# Patient Record
Sex: Female | Born: 1975
Health system: Southern US, Community
[De-identification: ages and names within clinical notes are randomized; demographics above are authoritative.]

## PROBLEM LIST (undated history)

## (undated) DIAGNOSIS — J301 Allergic rhinitis due to pollen: Secondary | ICD-10-CM

## (undated) DIAGNOSIS — N9489 Other specified conditions associated with female genital organs and menstrual cycle: Secondary | ICD-10-CM

## (undated) DIAGNOSIS — D649 Anemia, unspecified: Secondary | ICD-10-CM

## (undated) DIAGNOSIS — N6320 Unspecified lump in the left breast, unspecified quadrant: Secondary | ICD-10-CM

## (undated) DIAGNOSIS — K8020 Calculus of gallbladder without cholecystitis without obstruction: Secondary | ICD-10-CM

## (undated) DIAGNOSIS — Z87442 Personal history of urinary calculi: Secondary | ICD-10-CM

## (undated) DIAGNOSIS — K429 Umbilical hernia without obstruction or gangrene: Secondary | ICD-10-CM

## (undated) DIAGNOSIS — K219 Gastro-esophageal reflux disease without esophagitis: Secondary | ICD-10-CM

## (undated) DIAGNOSIS — N200 Calculus of kidney: Secondary | ICD-10-CM

## (undated) DIAGNOSIS — T7840XA Allergy, unspecified, initial encounter: Secondary | ICD-10-CM

## (undated) DIAGNOSIS — E039 Hypothyroidism, unspecified: Secondary | ICD-10-CM

## (undated) DIAGNOSIS — N390 Urinary tract infection, site not specified: Secondary | ICD-10-CM

## (undated) DIAGNOSIS — F419 Anxiety disorder, unspecified: Secondary | ICD-10-CM

## (undated) HISTORY — DX: Allergy, unspecified, initial encounter: T78.40XA

## (undated) HISTORY — DX: Umbilical hernia without obstruction or gangrene: K42.9

---

## 2003-03-11 ENCOUNTER — Other Ambulatory Visit: Admission: RE | Admit: 2003-03-11 | Discharge: 2003-03-11 | Payer: Self-pay | Admitting: Family Medicine

## 2003-04-26 ENCOUNTER — Encounter: Payer: Self-pay | Admitting: Family Medicine

## 2003-04-26 ENCOUNTER — Ambulatory Visit (HOSPITAL_COMMUNITY): Admission: RE | Admit: 2003-04-26 | Discharge: 2003-04-26 | Payer: Self-pay | Admitting: Family Medicine

## 2003-09-10 ENCOUNTER — Inpatient Hospital Stay: Admission: AD | Admit: 2003-09-10 | Discharge: 2003-09-10 | Payer: Self-pay | Admitting: Family Medicine

## 2003-09-21 ENCOUNTER — Inpatient Hospital Stay (HOSPITAL_COMMUNITY): Admission: AD | Admit: 2003-09-21 | Discharge: 2003-09-23 | Payer: Self-pay | Admitting: Family Medicine

## 2003-09-27 ENCOUNTER — Encounter: Admission: RE | Admit: 2003-09-27 | Discharge: 2003-10-27 | Payer: Self-pay | Admitting: Family Medicine

## 2004-04-12 ENCOUNTER — Other Ambulatory Visit: Admission: RE | Admit: 2004-04-12 | Discharge: 2004-04-12 | Payer: Self-pay | Admitting: Family Medicine

## 2005-09-13 ENCOUNTER — Other Ambulatory Visit: Admission: RE | Admit: 2005-09-13 | Discharge: 2005-09-13 | Payer: Self-pay | Admitting: Family Medicine

## 2013-01-12 ENCOUNTER — Other Ambulatory Visit: Payer: Self-pay | Admitting: Nurse Practitioner

## 2015-02-18 ENCOUNTER — Ambulatory Visit (INDEPENDENT_AMBULATORY_CARE_PROVIDER_SITE_OTHER): Payer: BLUE CROSS/BLUE SHIELD | Admitting: Nurse Practitioner

## 2015-02-18 ENCOUNTER — Encounter (INDEPENDENT_AMBULATORY_CARE_PROVIDER_SITE_OTHER): Payer: Self-pay

## 2015-02-18 VITALS — BP 108/70 | HR 73 | Temp 97.1°F | Ht 64.0 in | Wt 154.0 lb

## 2015-02-18 DIAGNOSIS — T192XXA Foreign body in vulva and vagina, initial encounter: Secondary | ICD-10-CM | POA: Diagnosis not present

## 2015-02-18 NOTE — Progress Notes (Signed)
   Subjective:    Patient ID: Erica Ross, female    DOB: [birth year], 39 y.o.   MRN: [id number]  HPI Patient started her menses this past Tuesday. She cannot remember if she put a tampon in or not- and just wants to be checked to make sure she did  Not leave one up in there. She says she cannot feel anything. Denies odor or pain.    Review of Systems  Constitutional: Negative.  Negative for chills.  HENT: Negative.   Respiratory: Negative.   Cardiovascular: Negative.   Genitourinary: Negative.   Neurological: Negative.   Psychiatric/Behavioral: Negative.   All other systems reviewed and are negative.      Objective:   Physical Exam  Constitutional: She is oriented to person, place, and time. She appears well-developed and well-nourished.  Cardiovascular: Normal rate, regular rhythm and normal heart sounds.   Pulmonary/Chest: Effort normal and breath sounds normal.  Genitourinary: Vagina normal.  No foreign body visible  Neurological: She is alert and oriented to person, place, and time.  Skin: Skin is warm and dry.  Psychiatric: She has a normal mood and affect. Her behavior is normal. Judgment and thought content normal.    BP 108/70 mmHg  Pulse 73  Temp(Src) 97.1 F (36.2 C) (Oral)  Ht 5\' 4"  (1.626 m)  Wt 154 lb (69.854 kg)  BMI 26.42 kg/m2  LMP 02/14/2015 (Exact Date)       Assessment & Plan:   1. Vaginal foreign body, initial encounter    No foreign body found Normal exam  Mary-Margaret Hassell Done, FNP

## 2015-04-13 ENCOUNTER — Encounter: Payer: Self-pay | Admitting: Physician Assistant

## 2015-04-13 ENCOUNTER — Ambulatory Visit (INDEPENDENT_AMBULATORY_CARE_PROVIDER_SITE_OTHER): Payer: BLUE CROSS/BLUE SHIELD | Admitting: Physician Assistant

## 2015-04-13 VITALS — BP 127/79 | HR 82 | Temp 97.1°F | Ht 64.0 in | Wt 149.0 lb

## 2015-04-13 DIAGNOSIS — N6320 Unspecified lump in the left breast, unspecified quadrant: Secondary | ICD-10-CM

## 2015-04-13 DIAGNOSIS — N63 Unspecified lump in breast: Secondary | ICD-10-CM | POA: Diagnosis not present

## 2015-04-13 NOTE — Progress Notes (Signed)
   Subjective:    Patient ID: Erica Ross, female    DOB: February 21, 1976, 39 y.o.   MRN: [id number]  HPI 39 y/o female presents with c/o lump under left breast. She states that she started working out 1 month ago and started wearing underwire bra. She noticed the lump last night. It is ttp and even hurts when walking down stairs.   She states that she has increased her caffeine intake over the past few weeks.     Review of Systems  Constitutional: Negative.   HENT: Negative.   Eyes: Negative.   Respiratory: Negative.   Cardiovascular: Negative.   Gastrointestinal: Negative.   Endocrine: Negative.   Genitourinary: Negative.   Musculoskeletal: Negative.   Skin:       Knot under left breast x 1 day. Painful, tender. Feels like "it needs to pop"       Objective:   Physical Exam  Skin:  2cm palpable non-fluctuant solid mass at 7:00 inferior border of left breast  Unable to express fluid from mass after using local anesthesia and 18 gauge needle.           Assessment & Plan:  1. Mass of breast, left  - MM Digital Diagnostic Bilat; Future  - Ultrasound if needed.   Will follow up with patient after results are obtained.   Shepherd Finnan [name] PA-C

## 2015-04-14 ENCOUNTER — Other Ambulatory Visit: Payer: Self-pay | Admitting: Physician Assistant

## 2015-04-14 ENCOUNTER — Telehealth: Payer: Self-pay | Admitting: *Deleted

## 2015-04-14 DIAGNOSIS — N63 Unspecified lump in unspecified breast: Secondary | ICD-10-CM

## 2015-04-14 DIAGNOSIS — F411 Generalized anxiety disorder: Secondary | ICD-10-CM

## 2015-04-14 MED ORDER — ALPRAZOLAM 0.25 MG PO TABS: 0.2500 mg | ORAL_TABLET | Freq: Three times a day (TID) | ORAL | Status: DC | PRN

## 2015-04-14 NOTE — Telephone Encounter (Signed)
Saw you yesterday and very worried about the mass in her breast. She hasn't slept for two days and can not stay calm. Wants to know if there is something she can have short term to help her sleep and function during the day until she gets through this ultrasound. Please advise and route to pool A. 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 Will call us back this afternoon because she can't get to a phone.

## 2015-04-14 NOTE — Telephone Encounter (Signed)
Rx at front desk that should help with her anxiety and sleeping   Erica Ross [name] PA-C

## 2015-04-17 NOTE — Telephone Encounter (Signed)
Spoke with patient- she states she has already picked up the prescription.

## 2015-04-17 NOTE — Telephone Encounter (Signed)
Left patient a message to return call

## 2015-04-18 ENCOUNTER — Telehealth: Payer: Self-pay | Admitting: Physician Assistant

## 2015-04-19 ENCOUNTER — Other Ambulatory Visit: Payer: Self-pay

## 2015-04-19 ENCOUNTER — Telehealth: Payer: Self-pay | Admitting: Physician Assistant

## 2015-04-19 NOTE — Telephone Encounter (Signed)
Stp and they couldn't do the imaging today due to machine malfunction but pt has imaging appt rescheduled for next week.

## 2015-04-20 NOTE — Telephone Encounter (Signed)
Appointment scheduled.

## 2015-04-26 ENCOUNTER — Ambulatory Visit
Admission: RE | Admit: 2015-04-26 | Discharge: 2015-04-26 | Disposition: A | Discharge status: Home / Self Care | Payer: BLUE CROSS/BLUE SHIELD | Source: Ambulatory Visit | Attending: Physician Assistant | Admitting: Physician Assistant

## 2015-04-26 DIAGNOSIS — N63 Unspecified lump in unspecified breast: Secondary | ICD-10-CM

## 2015-10-19 ENCOUNTER — Telehealth: Payer: Self-pay | Admitting: Nurse Practitioner

## 2016-03-08 ENCOUNTER — Other Ambulatory Visit: Payer: Self-pay | Admitting: Pediatrics

## 2016-03-08 DIAGNOSIS — Z1231 Encounter for screening mammogram for malignant neoplasm of breast: Secondary | ICD-10-CM

## 2016-03-25 ENCOUNTER — Ambulatory Visit (INDEPENDENT_AMBULATORY_CARE_PROVIDER_SITE_OTHER): Payer: BLUE CROSS/BLUE SHIELD | Admitting: Nurse Practitioner

## 2016-03-25 ENCOUNTER — Encounter: Payer: Self-pay | Admitting: Nurse Practitioner

## 2016-03-25 VITALS — BP 116/79 | HR 76 | Temp 98.4°F | Ht 64.0 in | Wt 153.0 lb

## 2016-03-25 DIAGNOSIS — Z0000 Encounter for general adult medical examination without abnormal findings: Secondary | ICD-10-CM

## 2016-03-25 DIAGNOSIS — Z01419 Encounter for gynecological examination (general) (routine) without abnormal findings: Secondary | ICD-10-CM

## 2016-03-25 LAB — MICROSCOPIC EXAMINATION
RBC, UA: NONE SEEN /hpf (ref 0–?)
WBC UA: NONE SEEN /HPF (ref 0–?)

## 2016-03-25 LAB — URINALYSIS, COMPLETE
BILIRUBIN UA: NEGATIVE
Glucose, UA: NEGATIVE
KETONES UA: NEGATIVE
Leukocytes, UA: NEGATIVE
Nitrite, UA: NEGATIVE
PH UA: 6 (ref 5.0–7.5)
PROTEIN UA: NEGATIVE
RBC UA: NEGATIVE
SPEC GRAV UA: 1.025 (ref 1.005–1.030)
UUROB: 0.2 mg/dL (ref 0.2–1.0)

## 2016-03-25 NOTE — Patient Instructions (Signed)
Pap Test WHY AM I HAVING THIS TEST? A pap test is sometimes called a pap smear. It is a screening test that is used to check for signs of cancer of the vagina, cervix, and uterus. The test can also identify the presence of infection or precancerous changes. Your health care provider will likely recommend you have this test done on a regular basis. This test may be done:  Every 3 years, starting at age 40.  Every 5 years, in combination with testing for the presence of human papillomavirus (HPV).  More or less often depending on other medical conditions.  WHAT KIND OF SAMPLE IS TAKEN? Using a small cotton swab, plastic spatula, or brush, your health care provider will collect a sample of cells from the surface of your cervix. Your cervix is the opening to your uterus, also called a womb. Secretions from the cervix and vagina may also be collected. HOW DO I PREPARE FOR THE TEST?  Be aware of where you are in your menstrual cycle. You may be asked to reschedule the test if you are menstruating on the day of the test.  You may need to reschedule if you have a known vaginal infection on the day of the test.  You may be asked to avoid douching or taking a bath the day before or the day of the test.  Some medicines can cause abnormal test results, such as digitalis and tetracycline. Talk with your health care provider before your test if you take one of these medicines. WHAT DO THE RESULTS MEAN? Abnormal test results may indicate a number of health conditions. These may include:  Cancer. Although pap test results cannot be used to diagnose cancer of the cervix, vagina, or uterus, they may suggest the possibility of cancer. Further tests would be required to determine if cancer is present.  Sexually transmitted disease.  Fungal infection.  Parasite infection.  Herpes infection.  A condition causing or contributing to infertility. It is your responsibility to obtain your test results. Ask  the lab or department performing the test when and how you will get your results. Contact your health care provider to discuss any questions you have about your results.   This information is not intended to replace advice given to you by your health care provider. Make sure you discuss any questions you have with your health care provider.   Document Released: 10/26/2002 Document Revised: 08/26/2014 Document Reviewed: 12/27/2013 Elsevier Interactive Patient Education 2016 Elsevier Inc.  

## 2016-03-25 NOTE — Progress Notes (Signed)
   Subjective:    Patient ID: Roger Kill, female    DOB: 31-Jan-1976, 40 y.o.   MRN: [id number]  HPI Patient comes in today for CPE and PAP- she is currently on no medications and has no medical problems.    Review of Systems  Constitutional: Negative.   HENT: Negative.   Respiratory: Negative.   Cardiovascular: Negative.   Gastrointestinal: Negative.   Genitourinary: Negative.   Neurological: Negative.   Psychiatric/Behavioral: Negative.   All other systems reviewed and are negative.      Objective:   Physical Exam  Constitutional: She is oriented to person, place, and time. She appears well-developed and well-nourished.  HENT:  Head: Normocephalic.  Right Ear: Hearing, tympanic membrane, external ear and ear canal normal.  Left Ear: Hearing, tympanic membrane, external ear and ear canal normal.  Nose: Nose normal.  Mouth/Throat: Uvula is midline and oropharynx is clear and moist.  Eyes: Conjunctivae and EOM are normal. Pupils are equal, round, and reactive to light.  Neck: Normal range of motion and full passive range of motion without pain. Neck supple. No JVD present. Carotid bruit is not present. No thyroid mass and no thyromegaly present.  Cardiovascular: Normal rate, normal heart sounds and intact distal pulses.   No murmur heard. Pulmonary/Chest: Effort normal and breath sounds normal. Right breast exhibits no inverted nipple, no mass, no nipple discharge, no skin change and no tenderness. Left breast exhibits no inverted nipple, no mass, no nipple discharge, no skin change and no tenderness.  Abdominal: Soft. Bowel sounds are normal. She exhibits no mass. There is no tenderness.  Genitourinary: Uterus normal. No breast swelling, tenderness, discharge or bleeding. Vaginal discharge found.  Genitourinary Comments: bimanual exam-No adnexal masses or tenderness. Cervix parous and pink  Musculoskeletal: Normal range of motion.  Lymphadenopathy:    She has no cervical  adenopathy.  Neurological: She is alert and oriented to person, place, and time.  Skin: Skin is warm and dry.  Psychiatric: She has a normal mood and affect. Her behavior is normal. Judgment and thought content normal.   BP 116/79 (BP Location: Right Arm, Patient Position: Sitting, Cuff Size: Normal)   Pulse 76   Temp 98.4 F (36.9 C) (Oral)   Ht '5\' 4"'$  (1.626 m)   Wt 153 lb (69.4 kg)   BMI 26.26 kg/m         Assessment & Plan:   1. Annual physical exam   2. Encounter for routine gynecological examination    Orders Placed This Encounter  Procedures  . Urinalysis, Complete  . CMP14+EGFR  . Lipid panel  . Thyroid Panel With TSH  . CBC with Differential/Platelet   Diet and exercise encouraged Follow up in 1 year  [place], FNP

## 2016-03-26 LAB — CBC WITH DIFFERENTIAL/PLATELET
BASOS: 1 %
Basophils Absolute: 0.1 10*3/uL (ref 0.0–0.2)
EOS (ABSOLUTE): 0.2 10*3/uL (ref 0.0–0.4)
Eos: 3 %
HEMATOCRIT: 40.2 % (ref 34.0–46.6)
Hemoglobin: 13.1 g/dL (ref 11.1–15.9)
Immature Grans (Abs): 0 10*3/uL (ref 0.0–0.1)
Immature Granulocytes: 0 %
LYMPHS ABS: 3.5 10*3/uL — AB (ref 0.7–3.1)
Lymphs: 37 %
MCH: 30 pg (ref 26.6–33.0)
MCHC: 32.6 g/dL (ref 31.5–35.7)
MCV: 92 fL (ref 79–97)
MONOS ABS: 0.7 10*3/uL (ref 0.1–0.9)
Monocytes: 7 %
NEUTROS ABS: 5 10*3/uL (ref 1.4–7.0)
NEUTROS PCT: 52 %
PLATELETS: 277 10*3/uL (ref 150–379)
RBC: 4.36 x10E6/uL (ref 3.77–5.28)
RDW: 13.8 % (ref 12.3–15.4)
WBC: 9.5 10*3/uL (ref 3.4–10.8)

## 2016-03-26 LAB — CMP14+EGFR
A/G RATIO: 1.4 (ref 1.2–2.2)
ALK PHOS: 66 IU/L (ref 39–117)
ALT: 15 IU/L (ref 0–32)
AST: 19 IU/L (ref 0–40)
Albumin: 4.3 g/dL (ref 3.5–5.5)
BUN/Creatinine Ratio: 20 (ref 9–23)
BUN: 12 mg/dL (ref 6–24)
CHLORIDE: 100 mmol/L (ref 96–106)
CO2: 25 mmol/L (ref 18–29)
Calcium: 9.3 mg/dL (ref 8.7–10.2)
Creatinine, Ser: 0.6 mg/dL (ref 0.57–1.00)
GFR calc Af Amer: 132 mL/min/{1.73_m2} (ref >59)
GFR calc non Af Amer: 114 mL/min/{1.73_m2} (ref >59)
Globulin, Total: 3 g/dL (ref 1.5–4.5)
Glucose: 90 mg/dL (ref 65–99)
POTASSIUM: 4.6 mmol/L (ref 3.5–5.2)
Sodium: 140 mmol/L (ref 134–144)
Total Protein: 7.3 g/dL (ref 6.0–8.5)

## 2016-03-26 LAB — THYROID PANEL WITH TSH
Free Thyroxine Index: 1.6 (ref 1.2–4.9)
T3 UPTAKE RATIO: 25 % (ref 24–39)
T4 TOTAL: 6.2 ug/dL (ref 4.5–12.0)
TSH: 1.77 u[IU]/mL (ref 0.450–4.500)

## 2016-03-26 LAB — LIPID PANEL
CHOL/HDL RATIO: 3.6 ratio (ref 0.0–4.4)
Cholesterol, Total: 172 mg/dL (ref 100–199)
HDL: 48 mg/dL (ref >39)
LDL Calculated: 109 mg/dL — ABNORMAL HIGH (ref 0–99)
TRIGLYCERIDES: 73 mg/dL (ref 0–149)
VLDL Cholesterol Cal: 15 mg/dL (ref 5–40)

## 2016-03-27 LAB — PAP IG W/ RFLX HPV ASCU: PAP SMEAR COMMENT: 0

## 2016-08-09 ENCOUNTER — Ambulatory Visit: Payer: BLUE CROSS/BLUE SHIELD

## 2017-02-25 ENCOUNTER — Ambulatory Visit: Payer: BLUE CROSS/BLUE SHIELD

## 2018-02-01 ENCOUNTER — Telehealth: Payer: Self-pay | Admitting: Family Medicine

## 2018-02-01 DIAGNOSIS — J029 Acute pharyngitis, unspecified: Secondary | ICD-10-CM | POA: Diagnosis not present

## 2018-02-01 DIAGNOSIS — Z6825 Body mass index (BMI) 25.0-25.9, adult: Secondary | ICD-10-CM | POA: Diagnosis not present

## 2018-02-01 NOTE — Telephone Encounter (Signed)
**  [facility] After Hours/ Emergency Line Call**  Patient: Erica Ross   PCP: Chevis Pretty, [place]  Patient calls to inform that she has a sore throat that started a couple of days ago.  She reports that now, the sore throat is worsening and has white patches on the back of her throat.  No fevers, nausea, vomiting. She reports malaise.  She has been taking tylenol, motrin and using salt water gargles.  I offered her an appt to be seen tomorrow morning but she declines.  She plans to go to UC today. She would like to reschedule her Physical w/ MM for next tues however because she is sick.  This was done is now on 6/25 @915a .  Continue current regimen until evaluated by UC. Will forward to PCP.  Romyn Boswell M. Lajuana Ripple, DO

## 2018-02-03 ENCOUNTER — Other Ambulatory Visit: Payer: BLUE CROSS/BLUE SHIELD | Admitting: Nurse Practitioner

## 2018-02-10 ENCOUNTER — Ambulatory Visit (INDEPENDENT_AMBULATORY_CARE_PROVIDER_SITE_OTHER): Payer: BLUE CROSS/BLUE SHIELD | Admitting: Nurse Practitioner

## 2018-02-10 ENCOUNTER — Encounter: Payer: Self-pay | Admitting: Nurse Practitioner

## 2018-02-10 VITALS — BP 113/77 | HR 69 | Temp 97.8°F | Ht 64.0 in | Wt 147.0 lb

## 2018-02-10 DIAGNOSIS — Z0000 Encounter for general adult medical examination without abnormal findings: Secondary | ICD-10-CM

## 2018-02-10 DIAGNOSIS — Z01419 Encounter for gynecological examination (general) (routine) without abnormal findings: Secondary | ICD-10-CM

## 2018-02-10 LAB — URINALYSIS, COMPLETE
Bilirubin, UA: NEGATIVE
Glucose, UA: NEGATIVE
Ketones, UA: NEGATIVE
LEUKOCYTES UA: NEGATIVE
Nitrite, UA: NEGATIVE
PH UA: 7.5 (ref 5.0–7.5)
PROTEIN UA: NEGATIVE
RBC, UA: NEGATIVE
Specific Gravity, UA: 1.01 (ref 1.005–1.030)
Urobilinogen, Ur: 0.2 mg/dL (ref 0.2–1.0)

## 2018-02-10 LAB — MICROSCOPIC EXAMINATION
Bacteria, UA: NONE SEEN
RBC MICROSCOPIC, UA: NONE SEEN /HPF (ref 0–2)
Renal Epithel, UA: NONE SEEN /hpf

## 2018-02-10 NOTE — Progress Notes (Signed)
   Subjective:    Patient ID: Erica Ross, female    DOB: [birth year], 42 y.o.   MRN: [id number]   Chief Complaint: Annual Exam   HPI patient comes in today for annual physical exam and pap. She has no chronic medical problems and is on no meds.  Medical history reviewed.    Review of Systems  Constitutional: Negative for activity change and appetite change.  HENT: Negative.   Eyes: Negative for pain.  Respiratory: Negative for shortness of breath.   Cardiovascular: Negative for chest pain, palpitations and leg swelling.  Gastrointestinal: Negative for abdominal pain.  Endocrine: Negative for polydipsia.  Genitourinary: Negative.   Skin: Negative for rash.  Neurological: Negative for dizziness, weakness and headaches.  Hematological: Does not bruise/bleed easily.  Psychiatric/Behavioral: Negative.   All other systems reviewed and are negative.      Objective:   Physical Exam  Constitutional: She is oriented to person, place, and time. She appears well-developed and well-nourished. No distress.  HENT:  Head: Normocephalic.  Nose: Nose normal.  Mouth/Throat: Oropharynx is clear and moist.  Eyes: Pupils are equal, round, and reactive to light. EOM are normal.  Neck: Normal range of motion. Neck supple. No JVD present. Carotid bruit is not present.  Cardiovascular: Normal rate, regular rhythm, normal heart sounds and intact distal pulses.  Pulmonary/Chest: Effort normal and breath sounds normal. No respiratory distress. She has no wheezes. She has no rales. She exhibits no tenderness.  Abdominal: Soft. Normal appearance, normal aorta and bowel sounds are normal. She exhibits no distension, no abdominal bruit, no pulsatile midline mass and no mass. There is no splenomegaly or hepatomegaly. There is no tenderness.  Genitourinary: Vagina normal. No vaginal discharge found.  Genitourinary Comments: Manual exam- no adnexal masses or tenderness Cervical os parous and pink    Musculoskeletal: Normal range of motion. She exhibits no edema.  Lymphadenopathy:    She has no cervical adenopathy.  Neurological: She is alert and oriented to person, place, and time. She has normal reflexes.  Skin: Skin is warm and dry.  Psychiatric: She has a normal mood and affect. Her behavior is normal. Judgment and thought content normal.   BP 113/77   Pulse 69   Temp 97.8 F (36.6 C) (Oral)   Ht '5\' 4"'$  (1.626 m)   Wt 147 lb (66.7 kg)   BMI 25.23 kg/m       Assessment & Plan:  Erica Ross comes in today with chief complaint of Annual Exam   Diagnosis and orders addressed:  1. Annual physical exam - Urinalysis, Complete - CMP14+EGFR - Lipid panel - Thyroid Panel With TSH - CBC with Differential/Platelet  2. Gynecologic exam normal - IGP, Aptima HPV, rfx 16/18,45   Labs pending Health Maintenance reviewed Diet and exercise encouraged  Follow up plan: 1 year   [place], FNP

## 2018-02-10 NOTE — Patient Instructions (Signed)

## 2018-02-11 LAB — THYROID PANEL WITH TSH
FREE THYROXINE INDEX: 1.8 (ref 1.2–4.9)
T3 Uptake Ratio: 26 % (ref 24–39)
T4 TOTAL: 7.1 ug/dL (ref 4.5–12.0)
TSH: 1.84 u[IU]/mL (ref 0.450–4.500)

## 2018-02-11 LAB — LIPID PANEL
Chol/HDL Ratio: 3.5 ratio (ref 0.0–4.4)
Cholesterol, Total: 174 mg/dL (ref 100–199)
HDL: 50 mg/dL (ref >39)
LDL Calculated: 109 mg/dL — ABNORMAL HIGH (ref 0–99)
Triglycerides: 73 mg/dL (ref 0–149)
VLDL Cholesterol Cal: 15 mg/dL (ref 5–40)

## 2018-02-11 LAB — CMP14+EGFR
A/G RATIO: 1.4 (ref 1.2–2.2)
ALBUMIN: 4.4 g/dL (ref 3.5–5.5)
ALT: 14 IU/L (ref 0–32)
AST: 16 IU/L (ref 0–40)
Alkaline Phosphatase: 68 IU/L (ref 39–117)
BUN / CREAT RATIO: 18 (ref 9–23)
BUN: 12 mg/dL (ref 6–24)
Bilirubin Total: 0.3 mg/dL (ref 0.0–1.2)
CHLORIDE: 104 mmol/L (ref 96–106)
CO2: 23 mmol/L (ref 20–29)
Calcium: 9.4 mg/dL (ref 8.7–10.2)
Creatinine, Ser: 0.67 mg/dL (ref 0.57–1.00)
GFR calc non Af Amer: 109 mL/min/{1.73_m2} (ref >59)
GFR, EST AFRICAN AMERICAN: 125 mL/min/{1.73_m2} (ref >59)
Globulin, Total: 3.2 g/dL (ref 1.5–4.5)
Glucose: 97 mg/dL (ref 65–99)
POTASSIUM: 4.5 mmol/L (ref 3.5–5.2)
SODIUM: 141 mmol/L (ref 134–144)
TOTAL PROTEIN: 7.6 g/dL (ref 6.0–8.5)

## 2018-02-11 LAB — CBC WITH DIFFERENTIAL/PLATELET
BASOS ABS: 0 10*3/uL (ref 0.0–0.2)
Basos: 0 %
EOS (ABSOLUTE): 0.2 10*3/uL (ref 0.0–0.4)
Eos: 2 %
HEMATOCRIT: 42.5 % (ref 34.0–46.6)
Hemoglobin: 13.8 g/dL (ref 11.1–15.9)
Immature Grans (Abs): 0 10*3/uL (ref 0.0–0.1)
Immature Granulocytes: 0 %
Lymphocytes Absolute: 3.4 10*3/uL — ABNORMAL HIGH (ref 0.7–3.1)
Lymphs: 38 %
MCH: 28.8 pg (ref 26.6–33.0)
MCHC: 32.5 g/dL (ref 31.5–35.7)
MCV: 89 fL (ref 79–97)
MONOS ABS: 0.8 10*3/uL (ref 0.1–0.9)
Monocytes: 9 %
Neutrophils Absolute: 4.6 10*3/uL (ref 1.4–7.0)
Neutrophils: 51 %
Platelets: 323 10*3/uL (ref 150–450)
RBC: 4.8 x10E6/uL (ref 3.77–5.28)
RDW: 13.9 % (ref 12.3–15.4)
WBC: 9 10*3/uL (ref 3.4–10.8)

## 2018-02-16 ENCOUNTER — Telehealth: Payer: Self-pay | Admitting: Nurse Practitioner

## 2018-02-16 LAB — IGP, APTIMA HPV, RFX 16/18,45
HPV Aptima: NEGATIVE
PAP Smear Comment: 0

## 2018-02-16 NOTE — Telephone Encounter (Signed)
Patient requesting pap results. Will you please review

## 2018-02-17 NOTE — Telephone Encounter (Signed)
Aware of results. 

## 2018-02-17 NOTE — Telephone Encounter (Signed)
Pap wsa normal- repeat in 2-3 years

## 2018-02-28 DIAGNOSIS — Z79899 Other long term (current) drug therapy: Secondary | ICD-10-CM | POA: Diagnosis not present

## 2018-02-28 DIAGNOSIS — N3000 Acute cystitis without hematuria: Secondary | ICD-10-CM | POA: Diagnosis not present

## 2018-02-28 DIAGNOSIS — R1903 Right lower quadrant abdominal swelling, mass and lump: Secondary | ICD-10-CM | POA: Diagnosis not present

## 2018-02-28 DIAGNOSIS — R109 Unspecified abdominal pain: Secondary | ICD-10-CM | POA: Diagnosis not present

## 2018-02-28 DIAGNOSIS — R1031 Right lower quadrant pain: Secondary | ICD-10-CM | POA: Diagnosis not present

## 2018-02-28 DIAGNOSIS — R1030 Lower abdominal pain, unspecified: Secondary | ICD-10-CM | POA: Diagnosis not present

## 2018-02-28 DIAGNOSIS — F4322 Adjustment disorder with anxiety: Secondary | ICD-10-CM | POA: Diagnosis not present

## 2018-02-28 DIAGNOSIS — K8020 Calculus of gallbladder without cholecystitis without obstruction: Secondary | ICD-10-CM | POA: Diagnosis not present

## 2018-02-28 DIAGNOSIS — N1330 Unspecified hydronephrosis: Secondary | ICD-10-CM | POA: Diagnosis not present

## 2018-02-28 DIAGNOSIS — K8080 Other cholelithiasis without obstruction: Secondary | ICD-10-CM | POA: Diagnosis not present

## 2018-02-28 DIAGNOSIS — N390 Urinary tract infection, site not specified: Secondary | ICD-10-CM | POA: Diagnosis not present

## 2018-03-02 ENCOUNTER — Telehealth: Payer: Self-pay | Admitting: Nurse Practitioner

## 2018-03-02 DIAGNOSIS — N9489 Other specified conditions associated with female genital organs and menstrual cycle: Secondary | ICD-10-CM

## 2018-03-02 DIAGNOSIS — R9389 Abnormal findings on diagnostic imaging of other specified body structures: Secondary | ICD-10-CM | POA: Diagnosis not present

## 2018-03-02 DIAGNOSIS — R1909 Other intra-abdominal and pelvic swelling, mass and lump: Secondary | ICD-10-CM | POA: Diagnosis not present

## 2018-03-02 DIAGNOSIS — R1903 Right lower quadrant abdominal swelling, mass and lump: Secondary | ICD-10-CM | POA: Diagnosis not present

## 2018-03-02 DIAGNOSIS — N1330 Unspecified hydronephrosis: Secondary | ICD-10-CM | POA: Diagnosis not present

## 2018-03-02 HISTORY — DX: Other specified conditions associated with female genital organs and menstrual cycle: N94.89

## 2018-03-03 ENCOUNTER — Telehealth: Payer: Self-pay | Admitting: Nurse Practitioner

## 2018-03-03 ENCOUNTER — Other Ambulatory Visit: Payer: Self-pay

## 2018-03-03 DIAGNOSIS — R1900 Intra-abdominal and pelvic swelling, mass and lump, unspecified site: Secondary | ICD-10-CM

## 2018-03-03 NOTE — Telephone Encounter (Signed)
Spoke with patient and notified of results. Patient verbalized understanding and requested that a referral go ahead and be placed for a surgeon. Referral made for urgent request.

## 2018-03-03 NOTE — Telephone Encounter (Signed)
There is a mass that is pressing on her ureter and making her kidney swell (hydronephrosis).  I recommend that a surgeon referral be placed and URGENT.

## 2018-03-03 NOTE — Telephone Encounter (Signed)
Patient called stating that she had a US done yesterday which she states showed a mass and would like for [place] to be reviewed as soon as possible.  Patient aware MMM is out of the office until next week and patient states that she would like for [place] to be reviewed by a different provider if possible.

## 2018-03-03 NOTE — Telephone Encounter (Signed)
Patient notified and referral placed

## 2018-03-04 ENCOUNTER — Telehealth: Payer: Self-pay

## 2018-03-04 ENCOUNTER — Other Ambulatory Visit: Payer: Self-pay

## 2018-03-04 DIAGNOSIS — R1900 Intra-abdominal and pelvic swelling, mass and lump, unspecified site: Secondary | ICD-10-CM

## 2018-03-04 NOTE — Telephone Encounter (Signed)
Called patient, discussed report.  [facility] recommended she be seen by urology bc of hydronephrosis.  She has an appointment tomorrow at 815.

## 2018-03-04 NOTE — Telephone Encounter (Signed)
I am sending this to you because this is [facility]MM Patient and Glenard Haring put in a referral for her for general surgeon for calcified mass in Right adnexa.  Patient is questioning should this be GYN and wants it sent to a Medical Dr to determine  I will put report on your desk as it was done at [facility]

## 2018-03-05 DIAGNOSIS — N130 Hydronephrosis with ureteropelvic junction obstruction: Secondary | ICD-10-CM | POA: Diagnosis not present

## 2018-03-05 DIAGNOSIS — N200 Calculus of kidney: Secondary | ICD-10-CM | POA: Diagnosis not present

## 2018-03-07 ENCOUNTER — Telehealth: Payer: Self-pay | Admitting: Nurse Practitioner

## 2018-03-09 NOTE — Telephone Encounter (Signed)
Please advise 

## 2018-03-09 NOTE — Telephone Encounter (Signed)
Spoke with patient and told her she would need to contact [facility] about her surgery since they are the ones that are scheduling it. [facility] to see if there was any way to assist and was told that a referral had been placed to Gyn/OnC (Dr. Denman George?) and Surg/Onc ( Dr. Laurance Flatten) They also sated that they had tried to contact pt and had been unsuccessful. I called pt back and told her to contact [facility] via nurses line

## 2018-03-11 ENCOUNTER — Ambulatory Visit: Payer: BLUE CROSS/BLUE SHIELD | Admitting: Obstetrics

## 2018-03-11 ENCOUNTER — Encounter: Payer: Self-pay | Admitting: Obstetrics

## 2018-03-11 ENCOUNTER — Inpatient Hospital Stay: Payer: BLUE CROSS/BLUE SHIELD | Attending: Obstetrics | Admitting: Obstetrics

## 2018-03-11 VITALS — BP 129/75 | HR 86 | Temp 98.7°F | Resp 20 | Ht 64.0 in | Wt 135.9 lb

## 2018-03-11 DIAGNOSIS — D398 Neoplasm of uncertain behavior of other specified female genital organs: Secondary | ICD-10-CM

## 2018-03-11 DIAGNOSIS — N1330 Unspecified hydronephrosis: Secondary | ICD-10-CM

## 2018-03-11 DIAGNOSIS — N131 Hydronephrosis with ureteral stricture, not elsewhere classified: Secondary | ICD-10-CM

## 2018-03-11 DIAGNOSIS — N9489 Other specified conditions associated with female genital organs and menstrual cycle: Secondary | ICD-10-CM

## 2018-03-11 NOTE — Patient Instructions (Signed)
You should be receiving a phone call from Dr. Rosario Jacks office with [facility] with an appointment.  Their office number is [phone].  Dr. Gerarda Fraction states that if the mass is determined to be the ovary, the plan would be for removal of the ovary and the other tube.  We will contact you with additional information if any after you have seen Dr. Barry Dienes.

## 2018-03-13 ENCOUNTER — Telehealth: Payer: Self-pay | Admitting: Oncology

## 2018-03-13 NOTE — Telephone Encounter (Signed)
[facility] to check on appointment status.  She has an appointment with Dr. Barry Dienes on 03/18/18 at 2 pm.

## 2018-03-17 ENCOUNTER — Encounter: Payer: Self-pay | Admitting: Obstetrics

## 2018-03-17 DIAGNOSIS — N1330 Unspecified hydronephrosis: Secondary | ICD-10-CM | POA: Insufficient documentation

## 2018-03-17 DIAGNOSIS — N9489 Other specified conditions associated with female genital organs and menstrual cycle: Secondary | ICD-10-CM | POA: Insufficient documentation

## 2018-03-17 NOTE — Progress Notes (Signed)
GYNECOLOGIC ONCOLOGY [facility] at [facility] MD,PhD, Marti Sleigh MD, Precious Haws Md, Everitt Amber MD [place] [facility] [place] Note: New Patient FIRST VISIT   Consult was requested by Dr. Link Snuffer with Urology for a mass in the right adnexa   Chief Complaint  Patient presents with  . Adnexal mass    HPI: Ms. Erica Ross  is a nice 42 y.o.  P3  At the end of May 2019 she experienced pain (mid/RL pelvis) and occasional right sided lower back pain. In July she experienced nausea and "vagal" symptoms and went to the emergency room. A CTAP 02/28/18 was performed and that revealed a 109mm right renal calculus and a soft tissue mass in the right adnexa described as solid with internal calcifications measuring 5.8x4.2x4.9cm appearing to have a vascular supply from the right adnexal region,however other consideration could be broad ligament fibroid or mesenteric mass. There was suspected extrinsic compression from the right ureter secondary to the mass with mild hydronephrosis. Images are viewable in [facility].   Followup TVUS was done 03/02/18 reveaeld a normal uterus 8.3x5.9x6.4cm with a normal right ovary and then superior to the right adnexa a 5.1x4.3x4.7 cm hypoechoic mass with internal calcifications and minimal peripheral vascularity that appears separate from the right ovary. Left ovary appeared normal. "thickend EM lning at 37mm but no note where she was in her cycle. Had TVUS 3 years ago and reports that was normal.  She was seen by urology, due to the ureteral compression and referred onto [place] due to concern for an ovarian neoplasm.  She has had some weight loss but attributes that to anxiety with these findings and so decreased appetite since finding out about the mass.  She had a Pap done at [facility] 02/18/18 noted to be negative with negative HRHPV.  Imported EPIC Oncologic History:   No history exists.   Measurement of  disease:  . TBD  Radiology: . None available in [facility] relevant to referral . I personally reviewed the imaging studies as follows: . 02/28/18 - CTAP - gallstones up to 2cm, no acute chole. 49mm calculus midpole right kidney, mild prominence right renal pelvis to level of a mesenteric mass. Bowel normal. No enlarged abdo/pelvic lymph nodes. Small mesenteric lymph node lower anterior abdomen 32mm. Uterus present. Left ovarian corpus luteum cyst. Right ovary difficult to identify. Superior to the Right adnexal region is a soft tissue mass described as oval in solid and contains numerous internal calcifications. 5.8x4.2x4.9cm and appearing to have vascular supply from right adnexal region favoring ovarian or broad ligament mass. Probable extrinsic compression right ureter. Small amount right adnexal fluid. . 03/02/18 - TVUS Uterus 8.3x5.9x6.4 EM 8mm No focal abnormality. Right ovary 2.3x1.1.9cm normal. Superior to right adnexa 5.1x4.3x4.7 cm hypoechoic mass with internal calcifications and minimal peripheral vascularity appears separate from right ovary.Left ovary normal with corpus luteum noted.   Outpatient Encounter Medications as of 03/11/2018  Medication Sig  . Ascorbic Acid (VITAMIN C) 1000 MG tablet Take 1,000 mg by mouth daily.  . Cholecalciferol (VITAMIN D-3) 5000 units TABS Take by mouth.  Marland Kitchen MAGNESIUM PO Take by mouth.  . Multiple Vitamin (MULTIVITAMIN) tablet Take 1 tablet by mouth daily.  . Omega-3 Fatty Acids (FISH OIL OMEGA-3 PO) Take by mouth.  Marland Kitchen OVER THE COUNTER MEDICATION   . OVER THE COUNTER MEDICATION   . Probiotic Product (PROBIOTIC DAILY PO) Take by mouth.  . RESVERATROL PO Take by mouth.  No facility-administered encounter medications on file as of 03/11/2018.    Allergies  Allergen Reactions  . Amoxicillin Rash    Past Medical History:  Diagnosis Date  . Hernia, umbilical    History reviewed. No pertinent surgical history.      Past Gynecological History:    GYNECOLOGIC HISTORY:  No LMP recorded. . Menarche: 42 years old . P 3 . LMP current . Contraceptive no OCP . HRT none  . Last Pap 01/2018 negative with neg HRHPV Family Hx:  Family History  Problem Relation Age of Onset  . Diabetes Mother   . Aneurysm Father   . Diabetes Brother   . Breast cancer Paternal Grandmother   . Breast cancer Other    Social Hx:  Marland Kitchen Tobacco use: never . Alcohol use: none . Illicit Drug use: none . Illicit IV Drug use: none    Review of Systems: Review of Systems  Constitutional: Positive for appetite change and unexpected weight change.  Psychiatric/Behavioral: The patient is nervous/anxious.   All other systems reviewed and are negative. Urinary urgency  Vitals: Blood pressure 129/75, pulse 86, temperature 98.7 F (37.1 C), temperature source Oral, resp. rate 20, height 5\' 4"  (1.626 m), weight 135 lb 14.4 oz (61.6 kg), SpO2 100 %. Vitals:   03/11/18 1400  Weight: 135 lb 14.4 oz (61.6 kg)  Height: 5\' 4"  (1.626 m)    Body mass index is 23.33 kg/m.   Physical Exam: General :  Well developed, 42 y.o., female in no apparent distress HEENT:  Normocephalic/atraumatic, symmetric, EOMI, eyelids normal Neck:   Supple, no masses.  Lymphatics:  No cervical/ submandibular/ supraclavicular/ infraclavicular/ inguinal adenopathy Respiratory:  Respirations unlabored, no use of accessory muscles CV:   Deferred Breast:  Deferred Musculoskeletal: No CVA tenderness, normal muscle strength. Abdomen:  Soft, non-tender and nondistended. No evidence of hernia. No masses. Extremities:  No lymphedema, no erythema, non-tender. Skin:   Normal inspection Neuro/Psych:  No focal motor deficit, no abnormal mental status. Normal gait. Normal affect. Alert and oriented to person, place, and time  Genito Urinary: Vulva: Normal external female genitalia.  Bladder/urethra: Urethral meatus normal in size and location. No lesions or   masses, well supported bladder Speculum  exam: Vagina: No lesion, no discharge, no bleeding. Cervix: Normal appearing, no lesions. Bimanual exam:  Uterus: Normal size, mobile.  Adnexa: No masses. Rectovaginal:  Good tone, no masses, no cul de sac nodularity, no parametrial involvement or nodularity.   Assessment  Pelvic mass - mesenteric versus adnexal  Plan  1. Etiology of mass o We discussed the possibility of mesenteric mass versus GYN (ovary versus fibroid) o I feel that it does not have an obvious stalk to call it a pedunculated fibroid. o It could be adnexal but it is higher than I would expect and I would not typically expect hydronephrosis to result 2. Recommendations o After discussing options of surgery with patient I think she should see surgery (Dr. Barry Dienes) o If after review Dr. Barry Dienes feel this is best approached in her hands we can be available should a GYN site of origin be found i. Vice versa, if Dr. Barry Dienes feels this is more likely adnexal we will be sure she or one of her partners is aware of the case o I defer any additional imaging until after her surgery visit. We did discuss MRI as an option to better determine the site of origin. i. I will hold off on ordering for now. 3. In the event  we do take her for surgical resection we discussed various scenarios and I did review the surgical sketch with her today with the R/B/A of considered procedures. o If adnexal we'll plan likely USO o If pedunculated fibroid she asked about retaining her uterus and that is reasonable o We briefly reviewed possibility of staging 4. We will await Dr. Marlowe Aschoff assessment before scheduling her surgery. 5. Ureteral stent - per urology.   Face to face time with patient was ~60 minutes. Over 50% of this time was spent on counseling and coordination of care.  Isabel Caprice, MD  03/17/2018, 3:30 PM    Cc: Link Snuffer, III, MD  (Referring Urologist) Chevis Pretty, FNP (PCP) Stark Klein, MD (Surg Onc)

## 2018-03-18 ENCOUNTER — Other Ambulatory Visit: Payer: Self-pay | Admitting: General Surgery

## 2018-03-18 DIAGNOSIS — R1903 Right lower quadrant abdominal swelling, mass and lump: Secondary | ICD-10-CM | POA: Diagnosis not present

## 2018-03-20 ENCOUNTER — Telehealth: Payer: Self-pay | Admitting: Oncology

## 2018-03-20 NOTE — Telephone Encounter (Signed)
Patient called back and said she would rather do a phone appointment with Dr. Gerarda Fraction because she lives an hour away and starts work next week.  She said the surgery has not been scheduled yet and that she was told it should be in the next month.

## 2018-03-20 NOTE — Telephone Encounter (Signed)
Left a message for Aala regarding possible appointment with Dr. Gerarda Fraction.  Requested a return call.

## 2018-03-21 ENCOUNTER — Telehealth: Payer: Self-pay | Admitting: Nurse Practitioner

## 2018-03-21 ENCOUNTER — Other Ambulatory Visit: Payer: Self-pay | Admitting: Family Medicine

## 2018-03-21 MED ORDER — NITROFURANTOIN MONOHYD MACRO 100 MG PO CAPS: 100.0000 mg | ORAL_CAPSULE | Freq: Two times a day (BID) | ORAL | 0 refills | Status: DC

## 2018-03-21 NOTE — Progress Notes (Signed)
Patient called with uti symptoms again, sent nitrofurantoin Caryl Pina, MD [facility] 03/21/2018, 2:15 PM

## 2018-03-23 NOTE — Telephone Encounter (Signed)
Message was from 8/3- pt already on antibiotic.

## 2018-03-24 ENCOUNTER — Other Ambulatory Visit: Payer: Self-pay | Admitting: Urology

## 2018-03-24 ENCOUNTER — Telehealth: Payer: Self-pay | Admitting: Obstetrics

## 2018-03-24 NOTE — Telephone Encounter (Signed)
Reviewed if ovarian in origin plan will be oophroectomy and bilateral salpingectomy then if malignant appropriate transition to TAH/Bso/Staging. If uterine in origin either myomectomy (if pedunculated fibroid) or hysterectomy/bilateral salpingectomy

## 2018-03-25 ENCOUNTER — Telehealth: Payer: Self-pay | Admitting: *Deleted

## 2018-03-25 NOTE — Telephone Encounter (Signed)
Faxed ROI to [facility]; release [phone]

## 2018-04-17 ENCOUNTER — Encounter (HOSPITAL_COMMUNITY): Payer: Self-pay

## 2018-04-17 NOTE — Patient Instructions (Addendum)
Your procedure is scheduled on: Sept. 11, 2019   Surgery Time:  8:30AM-11:30AM   Report to [place]  Entrance    Report to admitting at 6:30 AM   Call this number if you have problems the morning of surgery [phone]   NO SOLID FOOD AFTER MIDNIGHT THE NIGHT PRIOR TO SURGERY. NOTHING BY MOUTH EXCEPT CLEAR LIQUIDS UNTIL 3 HOURS PRIOR TO [place] SURGERY. PLEASE FINISH ENSURE DRINK PER SURGEON ORDER 3 HOURS PRIOR TO SCHEDULED SURGERY TIME WHICH NEEDS TO BE COMPLETED AT ____5:30AM____.     CLEAR LIQUID DIET   Foods Allowed                                                                     Foods Excluded  Coffee and tea, regular and decaf                             liquids that you cannot  Plain Jell-O in any flavor                                             see through such as: Fruit ices (not with fruit pulp)                                     milk, soups, orange juice  Iced Popsicles                                    All solid food Carbonated beverages, regular and diet                                    Cranberry, grape and apple juices Sports drinks like Gatorade Lightly seasoned clear broth or consume(fat free) Sugar, honey syrup  Sample Menu Breakfast                                Lunch                                     Supper Cranberry juice                    Beef broth                            Chicken broth Jell-O                                     Grape juice  Apple juice Coffee or tea                        Jell-O                                      Popsicle                                                Coffee or tea                        Coffee or tea  _____________________________________________________________________     Take these medicines the morning of surgery with A SIP OF WATER: zyrtec if needed                                You may not have any metal on your body including hair pins,  jewelry, and body piercings             Do not wear make-up, lotions, powders, perfumes/cologne, or deodorant             Do not wear nail polish.  Do not shave  48 hours prior to surgery.               Do not bring valuables to the hospital. [facility].   Contacts, dentures or bridgework may not be worn into surgery.   Leave suitcase in the car. After surgery it may be brought to your room.               Please read over the following fact sheets you were given:   [facility] - Preparing for Surgery Before surgery, you can play an important role.  Because skin is not sterile, your skin needs to be as free of germs as possible.  You can reduce the number of germs on your skin by washing with CHG (chlorahexidine gluconate) soap before surgery.  CHG is an antiseptic cleaner which kills germs and bonds with the skin to continue killing germs even after washing. Please DO NOT use if you have an allergy to CHG or antibacterial soaps.  If your skin becomes reddened/irritated stop using the CHG and inform your nurse when you arrive at Short Stay. Do not shave (including legs and underarms) for at least 48 hours prior to the first CHG shower.  You may shave your face/neck.  Please follow these instructions carefully:  1.  Shower with CHG Soap the night before surgery and the  morning of surgery.  2.  If you choose to wash your hair, wash your hair first as usual with your normal  shampoo.  3.  After you shampoo, rinse your hair and body thoroughly to remove the shampoo.                             4.  Use CHG as you would any other liquid soap.  You can apply chg directly to the skin and wash.  Gently with a  scrungie or clean washcloth.  5.  Apply the CHG Soap to your body ONLY FROM THE NECK DOWN.   Do   not use on face/ open                           Wound or open sores. Avoid contact with eyes, ears mouth and   genitals (private parts).                        Wash face,  Genitals (private parts) with your normal soap.             6.  Wash thoroughly, paying special attention to the area where your    surgery  will be performed.  7.  Thoroughly rinse your body with warm water from the neck down.  8.  DO NOT shower/wash with your normal soap after using and rinsing off the CHG Soap.                9.  Pat yourself dry with a clean towel.            10.  Wear clean pajamas.            11.  Place clean sheets on your bed the night of your first shower and do not  sleep with pets. Day of Surgery : Do not apply any lotions/deodorants the morning of surgery.  Please wear clean clothes to the hospital/surgery center.  FAILURE TO FOLLOW THESE INSTRUCTIONS MAY RESULT IN THE CANCELLATION OF YOUR SURGERY  PATIENT SIGNATURE_________________________________  NURSE SIGNATURE__________________________________  ________________________________________________________________________

## 2018-04-18 DIAGNOSIS — R350 Frequency of micturition: Secondary | ICD-10-CM | POA: Diagnosis not present

## 2018-04-18 DIAGNOSIS — R300 Dysuria: Secondary | ICD-10-CM | POA: Diagnosis not present

## 2018-04-18 DIAGNOSIS — Z6822 Body mass index (BMI) 22.0-22.9, adult: Secondary | ICD-10-CM | POA: Diagnosis not present

## 2018-04-22 ENCOUNTER — Encounter (HOSPITAL_COMMUNITY): Payer: Self-pay

## 2018-04-22 ENCOUNTER — Encounter (HOSPITAL_COMMUNITY)
Admission: RE | Admit: 2018-04-22 | Discharge: 2018-04-22 | Disposition: A | Discharge status: Home / Self Care | Payer: BLUE CROSS/BLUE SHIELD | Source: Ambulatory Visit | Attending: General Surgery | Admitting: General Surgery

## 2018-04-22 ENCOUNTER — Other Ambulatory Visit: Payer: Self-pay

## 2018-04-22 DIAGNOSIS — Z01812 Encounter for preprocedural laboratory examination: Secondary | ICD-10-CM | POA: Diagnosis not present

## 2018-04-22 DIAGNOSIS — R1900 Intra-abdominal and pelvic swelling, mass and lump, unspecified site: Secondary | ICD-10-CM | POA: Diagnosis not present

## 2018-04-22 HISTORY — DX: Calculus of kidney: N20.0

## 2018-04-22 HISTORY — DX: Calculus of gallbladder without cholecystitis without obstruction: K80.20

## 2018-04-22 HISTORY — DX: Urinary tract infection, site not specified: N39.0

## 2018-04-22 HISTORY — DX: Gastro-esophageal reflux disease without esophagitis: K21.9

## 2018-04-22 HISTORY — DX: Unspecified lump in the left breast, unspecified quadrant: N63.20

## 2018-04-22 HISTORY — DX: Other specified conditions associated with female genital organs and menstrual cycle: N94.89

## 2018-04-22 HISTORY — DX: Allergic rhinitis due to pollen: J30.1

## 2018-04-22 LAB — HCG, SERUM, QUALITATIVE: PREG SERUM: NEGATIVE

## 2018-04-22 LAB — CBC WITH DIFFERENTIAL/PLATELET
BASOS ABS: 0 10*3/uL (ref 0.0–0.1)
Basophils Relative: 0 %
EOS PCT: 2 %
Eosinophils Absolute: 0.1 10*3/uL (ref 0.0–0.7)
HEMATOCRIT: 40 % (ref 36.0–46.0)
Hemoglobin: 13.4 g/dL (ref 12.0–15.0)
LYMPHS ABS: 2.5 10*3/uL (ref 0.7–4.0)
LYMPHS PCT: 30 %
MCH: 30.2 pg (ref 26.0–34.0)
MCHC: 33.5 g/dL (ref 30.0–36.0)
MCV: 90.1 fL (ref 78.0–100.0)
Monocytes Absolute: 0.7 10*3/uL (ref 0.1–1.0)
Monocytes Relative: 9 %
NEUTROS ABS: 4.9 10*3/uL (ref 1.7–7.7)
Neutrophils Relative %: 59 %
PLATELETS: 227 10*3/uL (ref 150–400)
RBC: 4.44 MIL/uL (ref 3.87–5.11)
RDW: 14.5 % (ref 11.5–15.5)
WBC: 8.3 10*3/uL (ref 4.0–10.5)

## 2018-04-22 LAB — BASIC METABOLIC PANEL
ANION GAP: 9 (ref 5–15)
BUN: 18 mg/dL (ref 6–20)
CO2: 26 mmol/L (ref 22–32)
Calcium: 9.1 mg/dL (ref 8.9–10.3)
Chloride: 107 mmol/L (ref 98–111)
Creatinine, Ser: 0.61 mg/dL (ref 0.44–1.00)
GFR calc Af Amer: >60 mL/min (ref >60)
GLUCOSE: 95 mg/dL (ref 70–99)
POTASSIUM: 3.9 mmol/L (ref 3.5–5.1)
SODIUM: 142 mmol/L (ref 135–145)

## 2018-04-22 LAB — URINALYSIS, ROUTINE W REFLEX MICROSCOPIC
Bilirubin Urine: NEGATIVE
GLUCOSE, UA: NEGATIVE mg/dL
HGB URINE DIPSTICK: NEGATIVE
Ketones, ur: 5 mg/dL — AB
LEUKOCYTES UA: NEGATIVE
Nitrite: NEGATIVE
PH: 6 (ref 5.0–8.0)
Protein, ur: NEGATIVE mg/dL
SPECIFIC GRAVITY, URINE: 1.008 (ref 1.005–1.030)

## 2018-04-23 LAB — ABO/RH: ABO/RH(D): O NEG

## 2018-04-24 LAB — CHROMOGRANIN A

## 2018-04-27 NOTE — H&P (Signed)
Erica Ross  Location: [facility] Surgery Patient #: [id number] DOB: 1975/12/22 Married / Language: English / Race: White Female   History of Present Illness The patient is a 42 year old female who presents with an abdominal mass. Patient is a 43 year old female referred by Dr. Gloriann Loan for a consultation regarding an intra-abdominal mass. The patient presented to the emergency department with right lower quadrant and right flank pain. A CT was performed which demonstrated a calcified mass in the right lower quadrant. It w patient as not clear if this reflected an adnexal mass so a pelvic ultrasound was performed. The pelvic ultrasound demonstrated that the mass appeared to be separate from the ovary and the uterus. There is no evidence of a stalk visible. The mass is approximately 5 cm in greatest dimension. The patient states that the pain that she was having is about 75% resolved.  The patient has no nausea, vomiting, bloating, change in bowel habits, hematuria, dysuria, or menstrual pain. Her last period was a bit earlier than normal. She does think that may be some of her stools have been slightly smaller than normal. She has had a mammogram, but has not had any endoscopies. Her family history is significant for breast cancer in her paternal grandmother and great-grandmother. However her father has not had cancer. She has some maternal aunts and uncles that have had some cancers but her mother has not had cancer.  CT images and reports are visible in canopy. These are reviewed along with images of ultrasound. Of note, the mass is just anterior to her right ureter and she had some mild hydro on the CT. Also, her uterus is pulled up a bit to the right.    Past Surgical History No pertinent past surgical history   Diagnostic Studies History  Colonoscopy  never Mammogram  1-3 years ago Pap Smear  1-5 years ago  Allergies Amoxicillin *PENICILLINS*   Medication  History Multi-Vitamin (Oral) Active. Medications Reconciled  Social History Caffeine use  Coffee, Tea. No alcohol use  No drug use  Tobacco use  Never smoker.  Family History Breast Cancer  Family Members In General. Cerebrovascular Accident  Father. Hypertension  Brother, Mother. Prostate Cancer  Family Members In General.  Pregnancy / Birth History Age at menarche  54 years. Age of menopause  <45 Gravida  3 Length (months) of breastfeeding  7-12 Maternal age  23-30 Para  3 Regular periods   Other Problems Cholelithiasis  Gastroesophageal Reflux Disease  Umbilical Hernia Repair     Review of Systems General Not Present- Appetite Loss, Chills, Fatigue, Fever, Night Sweats, Weight Gain and Weight Loss. Skin Not Present- Change in Wart/Mole, Dryness, Hives, Jaundice, New Lesions, Non-Healing Wounds, Rash and Ulcer. HEENT Present- Seasonal Allergies and Wears glasses/contact lenses. Not Present- Earache, Hearing Loss, Hoarseness, Nose Bleed, Oral Ulcers, Ringing in the Ears, Sinus Pain, Sore Throat, Visual Disturbances and Yellow Eyes. Respiratory Not Present- Bloody sputum, Chronic Cough, Difficulty Breathing, Snoring and Wheezing. Breast Not Present- Breast Mass, Breast Pain, Nipple Discharge and Skin Changes. Cardiovascular Not Present- Chest Pain, Difficulty Breathing Lying Down, Leg Cramps, Palpitations, Rapid Heart Rate, Shortness of Breath and Swelling of Extremities. Gastrointestinal Present- Hemorrhoids. Not Present- Abdominal Pain, Bloating, Bloody Stool, Change in Bowel Habits, Chronic diarrhea, Constipation, Difficulty Swallowing, Excessive gas, Gets full quickly at meals, Indigestion, Nausea, Rectal Pain and Vomiting. Female Genitourinary Present- Urgency. Not Present- Frequency, Nocturia, Painful Urination and Pelvic Pain. Musculoskeletal Not Present- Back Pain, Joint Pain, Joint Stiffness, Muscle  Pain, Muscle Weakness and Swelling of  Extremities. Neurological Not Present- Decreased Memory, Fainting, Headaches, Numbness, Seizures, Tingling, Tremor, Trouble walking and Weakness. Psychiatric Not Present- Anxiety, Bipolar, Change in Sleep Pattern, Depression, Fearful and Frequent crying. Endocrine Not Present- Cold Intolerance, Excessive Hunger, Hair Changes, Heat Intolerance, Hot flashes and New Diabetes. Hematology Not Present- Blood Thinners, Easy Bruising, Excessive bleeding, Gland problems, HIV and Persistent Infections.  Vitals Weight: 138.13 lb Height: 63.75in Body Surface Area: 1.67 m Body Mass Index: 23.9 kg/m  Temp.: 98.37F(Oral)  Pulse: 86 (Regular)  BP: 118/80 (Sitting, Left Arm, Standard)       Physical Exam  General Mental Status-Alert. General Appearance-Consistent with stated age. Hydration-Well hydrated. Voice-Normal.  Head and Neck Head-normocephalic, atraumatic with no lesions or palpable masses. Trachea-midline. Thyroid Gland Characteristics - normal size and consistency.  Eye Eyeball - Bilateral-Extraocular movements intact. Sclera/Conjunctiva - Bilateral-No scleral icterus.  Chest and Lung Exam Chest and lung exam reveals -quiet, even and easy respiratory effort with no use of accessory muscles and on auscultation, normal breath sounds, no adventitious sounds and normal vocal resonance. Inspection Chest Wall - Normal. Back - normal.  Cardiovascular Cardiovascular examination reveals -normal heart sounds, regular rate and rhythm with no murmurs and normal pedal pulses bilaterally.  Abdomen Inspection Inspection of the abdomen reveals - No Hernias. Palpation/Percussion Palpation and Percussion of the abdomen reveal - Soft, Non Tender, No Rebound tenderness, No Rigidity (guarding) and No hepatosplenomegaly. Auscultation Auscultation of the abdomen reveals - Bowel sounds normal.  Neurologic Neurologic evaluation reveals -alert and oriented x 3  with no impairment of recent or remote memory. Mental Status-Normal.  Musculoskeletal Global Assessment -Note: no gross deformities.  Normal Exam - Left-Upper Extremity Strength Normal and Lower Extremity Strength Normal. Normal Exam - Right-Upper Extremity Strength Normal and Lower Extremity Strength Normal.  Lymphatic Head & Neck  General Head & Neck Lymphatics: Bilateral - Description - Normal. Axillary  General Axillary Region: Bilateral - Description - Normal. Tenderness - Non Tender. Femoral & Inguinal  Generalized Femoral & Inguinal Lymphatics: Bilateral - Description - No Generalized lymphadenopathy.    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 It may reflect an adnexal structure. In any event, there is diagnostic uncertainty and it is not normal to be there. I will plan a lap/open approach depending on what is found. I discussed that I may need to remove a portion of her bowel wtih the tumor. I will do a bowel prep due to that possibility. Because of the proximity of the mass to her ureter, I will ask Dr. Gloriann Loan to place a right ureteral stent for the surgery.  I discussed the risks of damage to adjacent structures. The patient is done with childbearing. 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 She is concerned that with her symptoms improving, maybe the mass may have gone away. This is not likely due to the characteristics of the tumor.  I discussed that we would at least need an incision large enough to remove the mass through. Current Plans You are being scheduled for surgery- Our schedulers will call you.  You should hear from our office's scheduling department within 5 working days about the location, date, and time of surgery. We try to make  accommodations for patient's preferences in scheduling surgery, but  sometimes the OR schedule or the surgeon's schedule prevents [place] from making those accommodations.  If you have not heard from our office [phone]) in 5 working days, call the office and ask for your surgeon's nurse.  If you have other questions about your diagnosis, plan, or surgery, call the office and ask for your surgeon's nurse.  Pt Education - [facility] Colon Bowel Prep 2018 ERAS/Miralax/Antibiotics   Signed by Stark Klein, MD

## 2018-04-28 ENCOUNTER — Encounter (HOSPITAL_COMMUNITY): Payer: Self-pay | Admitting: Certified Registered Nurse Anesthetist

## 2018-04-29 ENCOUNTER — Encounter (HOSPITAL_COMMUNITY): Payer: Self-pay | Admitting: *Deleted

## 2018-04-29 ENCOUNTER — Ambulatory Visit (HOSPITAL_COMMUNITY): Payer: BLUE CROSS/BLUE SHIELD | Admitting: Certified Registered Nurse Anesthetist

## 2018-04-29 ENCOUNTER — Encounter (HOSPITAL_COMMUNITY)
Admission: AD | Disposition: A | Discharge status: Home / Self Care | Payer: Self-pay | Source: Home / Self Care | Attending: General Surgery

## 2018-04-29 ENCOUNTER — Other Ambulatory Visit: Payer: Self-pay

## 2018-04-29 ENCOUNTER — Ambulatory Visit (HOSPITAL_COMMUNITY): Payer: BLUE CROSS/BLUE SHIELD

## 2018-04-29 ENCOUNTER — Inpatient Hospital Stay (HOSPITAL_COMMUNITY)
Admission: AD | Admit: 2018-04-29 | Discharge: 2018-05-03 | DRG: 330 | Disposition: A | Discharge status: Home / Self Care | Payer: BLUE CROSS/BLUE SHIELD | Attending: General Surgery | Admitting: General Surgery

## 2018-04-29 DIAGNOSIS — D62 Acute posthemorrhagic anemia: Secondary | ICD-10-CM | POA: Diagnosis not present

## 2018-04-29 DIAGNOSIS — I959 Hypotension, unspecified: Secondary | ICD-10-CM | POA: Diagnosis not present

## 2018-04-29 DIAGNOSIS — K219 Gastro-esophageal reflux disease without esophagitis: Secondary | ICD-10-CM | POA: Diagnosis present

## 2018-04-29 DIAGNOSIS — N1330 Unspecified hydronephrosis: Secondary | ICD-10-CM | POA: Diagnosis not present

## 2018-04-29 DIAGNOSIS — R1903 Right lower quadrant abdominal swelling, mass and lump: Secondary | ICD-10-CM | POA: Diagnosis not present

## 2018-04-29 DIAGNOSIS — D490 Neoplasm of unspecified behavior of digestive system: Secondary | ICD-10-CM | POA: Diagnosis not present

## 2018-04-29 DIAGNOSIS — R55 Syncope and collapse: Secondary | ICD-10-CM | POA: Diagnosis not present

## 2018-04-29 DIAGNOSIS — N132 Hydronephrosis with renal and ureteral calculous obstruction: Secondary | ICD-10-CM | POA: Diagnosis not present

## 2018-04-29 DIAGNOSIS — K668 Other specified disorders of peritoneum: Secondary | ICD-10-CM | POA: Diagnosis present

## 2018-04-29 DIAGNOSIS — D374 Neoplasm of uncertain behavior of colon: Secondary | ICD-10-CM | POA: Diagnosis not present

## 2018-04-29 DIAGNOSIS — R1900 Intra-abdominal and pelvic swelling, mass and lump, unspecified site: Secondary | ICD-10-CM | POA: Diagnosis not present

## 2018-04-29 HISTORY — PX: CYSTOSCOPY WITH STENT PLACEMENT: SHX5790

## 2018-04-29 HISTORY — PX: LAPAROSCOPIC REMOVAL ABDOMINAL MASS: SHX6360

## 2018-04-29 LAB — CBC
HCT: 37.9 % (ref 36.0–46.0)
Hemoglobin: 12.5 g/dL (ref 12.0–15.0)
MCH: 30.4 pg (ref 26.0–34.0)
MCHC: 33 g/dL (ref 30.0–36.0)
MCV: 92.2 fL (ref 78.0–100.0)
Platelets: 245 10*3/uL (ref 150–400)
RBC: 4.11 MIL/uL (ref 3.87–5.11)
RDW: 14.2 % (ref 11.5–15.5)
WBC: 17.3 10*3/uL — AB (ref 4.0–10.5)

## 2018-04-29 LAB — CREATININE, SERUM: Creatinine, Ser: 0.52 mg/dL (ref 0.44–1.00)

## 2018-04-29 SURGERY — REMOVAL, MASS, ABDOMEN, LAPAROSCOPIC
Anesthesia: General | Site: Urethra

## 2018-04-29 MED ORDER — ONDANSETRON HCL 4 MG/2ML IJ SOLN
INTRAMUSCULAR | Status: DC | PRN
  Administered 2018-04-29: 4 mg via INTRAVENOUS

## 2018-04-29 MED ORDER — SCOPOLAMINE 1 MG/3DAYS TD PT72
MEDICATED_PATCH | TRANSDERMAL | Status: DC | PRN
  Administered 2018-04-29: 1 via TRANSDERMAL

## 2018-04-29 MED ORDER — OXYCODONE HCL 5 MG PO TABS
5.0000 mg | ORAL_TABLET | ORAL | Status: DC | PRN
  Administered 2018-04-29 – 2018-05-03 (×5): 5 mg via ORAL
  Filled 2018-04-29 (×5): qty 1

## 2018-04-29 MED ORDER — ACETAMINOPHEN 325 MG PO TABS: 650.0000 mg | ORAL_TABLET | Freq: Four times a day (QID) | ORAL | Status: DC | PRN

## 2018-04-29 MED ORDER — ALBUMIN HUMAN 5 % IV SOLN
12.5000 g | Freq: Once | INTRAVENOUS | Status: AC
  Administered 2018-04-29: 12.5 g via INTRAVENOUS

## 2018-04-29 MED ORDER — RISAQUAD PO CAPS
1.0000 | ORAL_CAPSULE | Freq: Every day | ORAL | Status: DC
  Administered 2018-05-01: 1 via ORAL
  Filled 2018-04-29 (×2): qty 1

## 2018-04-29 MED ORDER — MIDAZOLAM HCL 5 MG/5ML IJ SOLN
INTRAMUSCULAR | Status: DC | PRN
  Administered 2018-04-29: 2 mg via INTRAVENOUS

## 2018-04-29 MED ORDER — ONDANSETRON HCL 4 MG/2ML IJ SOLN: 4.0000 mg | Freq: Four times a day (QID) | INTRAMUSCULAR | Status: DC | PRN

## 2018-04-29 MED ORDER — KETAMINE HCL 10 MG/ML IJ SOLN
INTRAMUSCULAR | Status: DC | PRN
  Administered 2018-04-29: 25 mg via INTRAVENOUS

## 2018-04-29 MED ORDER — BUPIVACAINE LIPOSOME 1.3 % IJ SUSP
20.0000 mL | Freq: Once | INTRAMUSCULAR | Status: AC
  Administered 2018-04-29: 20 mL
  Filled 2018-04-29: qty 20

## 2018-04-29 MED ORDER — PROBIOTIC PO CAPS: ORAL_CAPSULE | Freq: Every day | ORAL | Status: DC

## 2018-04-29 MED ORDER — ALUM & MAG HYDROXIDE-SIMETH 200-200-20 MG/5ML PO SUSP: 30.0000 mL | Freq: Four times a day (QID) | ORAL | Status: DC | PRN

## 2018-04-29 MED ORDER — ENSURE SURGERY PO LIQD
237.0000 mL | Freq: Two times a day (BID) | ORAL | Status: DC
  Administered 2018-04-30 – 2018-05-03 (×6): 237 mL via ORAL
  Filled 2018-04-29 (×9): qty 237

## 2018-04-29 MED ORDER — FENTANYL CITRATE (PF) 250 MCG/5ML IJ SOLN
INTRAMUSCULAR | Status: AC
  Filled 2018-04-29: qty 5

## 2018-04-29 MED ORDER — ZOLPIDEM TARTRATE 5 MG PO TABS: 5.0000 mg | ORAL_TABLET | Freq: Every evening | ORAL | Status: DC | PRN

## 2018-04-29 MED ORDER — BUPIVACAINE-EPINEPHRINE (PF) 0.5% -1:200000 IJ SOLN
INTRAMUSCULAR | Status: AC
  Filled 2018-04-29: qty 30

## 2018-04-29 MED ORDER — ONDANSETRON HCL 4 MG/2ML IJ SOLN: 4.0000 mg | Freq: Once | INTRAMUSCULAR | Status: DC | PRN

## 2018-04-29 MED ORDER — DIPHENHYDRAMINE HCL 12.5 MG/5ML PO ELIX: 12.5000 mg | ORAL_SOLUTION | Freq: Four times a day (QID) | ORAL | Status: DC | PRN

## 2018-04-29 MED ORDER — HYDROMORPHONE HCL 1 MG/ML IJ SOLN
0.5000 mg | INTRAMUSCULAR | Status: DC | PRN
  Administered 2018-04-30: 0.5 mg via INTRAVENOUS
  Filled 2018-04-29 (×2): qty 1

## 2018-04-29 MED ORDER — CELECOXIB 200 MG PO CAPS
200.0000 mg | ORAL_CAPSULE | ORAL | Status: AC
  Administered 2018-04-29: 200 mg via ORAL
  Filled 2018-04-29: qty 1

## 2018-04-29 MED ORDER — 0.9 % SODIUM CHLORIDE (POUR BTL) OPTIME
TOPICAL | Status: DC | PRN
  Administered 2018-04-29: 2000 mL

## 2018-04-29 MED ORDER — PROPOFOL 10 MG/ML IV BOLUS
INTRAVENOUS | Status: AC
  Filled 2018-04-29: qty 20

## 2018-04-29 MED ORDER — ONDANSETRON HCL 4 MG PO TABS: 4.0000 mg | ORAL_TABLET | Freq: Four times a day (QID) | ORAL | Status: DC | PRN

## 2018-04-29 MED ORDER — FENTANYL CITRATE (PF) 100 MCG/2ML IJ SOLN
INTRAMUSCULAR | Status: AC
  Filled 2018-04-29: qty 2

## 2018-04-29 MED ORDER — KCL IN DEXTROSE-NACL 20-5-0.45 MEQ/L-%-% IV SOLN
INTRAVENOUS | Status: DC
  Administered 2018-04-29: 15:00:00 via INTRAVENOUS
  Filled 2018-04-29 (×2): qty 1000

## 2018-04-29 MED ORDER — CELECOXIB 200 MG PO CAPS
200.0000 mg | ORAL_CAPSULE | Freq: Two times a day (BID) | ORAL | Status: DC
  Administered 2018-04-29 – 2018-05-03 (×7): 200 mg via ORAL
  Filled 2018-04-29 (×7): qty 1

## 2018-04-29 MED ORDER — EPHEDRINE SULFATE-NACL 50-0.9 MG/10ML-% IV SOSY
PREFILLED_SYRINGE | INTRAVENOUS | Status: DC | PRN
  Administered 2018-04-29 (×3): 5 mg via INTRAVENOUS

## 2018-04-29 MED ORDER — ONDANSETRON HCL 4 MG/2ML IJ SOLN
INTRAMUSCULAR | Status: AC
  Filled 2018-04-29: qty 2

## 2018-04-29 MED ORDER — DEXAMETHASONE SODIUM PHOSPHATE 10 MG/ML IJ SOLN
INTRAMUSCULAR | Status: AC
  Filled 2018-04-29: qty 1

## 2018-04-29 MED ORDER — DEXAMETHASONE SODIUM PHOSPHATE 4 MG/ML IJ SOLN
INTRAMUSCULAR | Status: DC | PRN
  Administered 2018-04-29: 10 mg via INTRAVENOUS

## 2018-04-29 MED ORDER — ENOXAPARIN SODIUM 40 MG/0.4ML ~~LOC~~ SOLN: 40.0000 mg | SUBCUTANEOUS | Status: DC

## 2018-04-29 MED ORDER — PHENYLEPHRINE 40 MCG/ML (10ML) SYRINGE FOR IV PUSH (FOR BLOOD PRESSURE SUPPORT)
PREFILLED_SYRINGE | INTRAVENOUS | Status: DC | PRN
  Administered 2018-04-29 (×2): 120 ug via INTRAVENOUS
  Administered 2018-04-29 (×3): 80 ug via INTRAVENOUS

## 2018-04-29 MED ORDER — GABAPENTIN 300 MG PO CAPS
300.0000 mg | ORAL_CAPSULE | Freq: Two times a day (BID) | ORAL | Status: DC
  Administered 2018-04-29 – 2018-05-03 (×6): 300 mg via ORAL
  Filled 2018-04-29 (×7): qty 1

## 2018-04-29 MED ORDER — CHLORHEXIDINE GLUCONATE CLOTH 2 % EX PADS: 6.0000 | MEDICATED_PAD | Freq: Once | CUTANEOUS | Status: DC

## 2018-04-29 MED ORDER — FENTANYL CITRATE (PF) 100 MCG/2ML IJ SOLN
25.0000 ug | INTRAMUSCULAR | Status: DC | PRN
  Administered 2018-04-29 (×2): 25 ug via INTRAVENOUS
  Administered 2018-04-29: 50 ug via INTRAVENOUS

## 2018-04-29 MED ORDER — LIDOCAINE 2% (20 MG/ML) 5 ML SYRINGE
INTRAMUSCULAR | Status: DC | PRN
  Administered 2018-04-29: 60 mg via INTRAVENOUS

## 2018-04-29 MED ORDER — CLINDAMYCIN PHOSPHATE 900 MG/50ML IV SOLN
900.0000 mg | Freq: Three times a day (TID) | INTRAVENOUS | Status: AC
  Administered 2018-04-29: 900 mg via INTRAVENOUS
  Filled 2018-04-29: qty 50

## 2018-04-29 MED ORDER — KETAMINE HCL 10 MG/ML IJ SOLN
INTRAMUSCULAR | Status: AC
  Filled 2018-04-29: qty 1

## 2018-04-29 MED ORDER — ALBUMIN HUMAN 5 % IV SOLN
INTRAVENOUS | Status: AC
  Filled 2018-04-29: qty 250

## 2018-04-29 MED ORDER — LORATADINE 10 MG PO TABS: 10.0000 mg | ORAL_TABLET | Freq: Every day | ORAL | Status: DC | PRN

## 2018-04-29 MED ORDER — PROPOFOL 10 MG/ML IV BOLUS
INTRAVENOUS | Status: DC | PRN
  Administered 2018-04-29: 120 mg via INTRAVENOUS

## 2018-04-29 MED ORDER — ROCURONIUM BROMIDE 10 MG/ML (PF) SYRINGE
PREFILLED_SYRINGE | INTRAVENOUS | Status: AC
  Filled 2018-04-29: qty 10

## 2018-04-29 MED ORDER — CIPROFLOXACIN IN D5W 400 MG/200ML IV SOLN
400.0000 mg | INTRAVENOUS | Status: AC
  Administered 2018-04-29: 400 mg via INTRAVENOUS
  Filled 2018-04-29: qty 200

## 2018-04-29 MED ORDER — STERILE WATER FOR IRRIGATION IR SOLN
Status: DC | PRN
  Administered 2018-04-29: 1000 mL

## 2018-04-29 MED ORDER — ROCURONIUM BROMIDE 10 MG/ML (PF) SYRINGE
PREFILLED_SYRINGE | INTRAVENOUS | Status: DC | PRN
  Administered 2018-04-29 (×2): 20 mg via INTRAVENOUS
  Administered 2018-04-29: 30 mg via INTRAVENOUS

## 2018-04-29 MED ORDER — LIDOCAINE 2% (20 MG/ML) 5 ML SYRINGE
INTRAMUSCULAR | Status: DC | PRN
  Administered 2018-04-29: 1.5 mg/kg/h via INTRAVENOUS

## 2018-04-29 MED ORDER — CETIRIZINE HCL 5 MG/5ML PO SOLN: 5.0000 mg | Freq: Every day | ORAL | Status: DC | PRN

## 2018-04-29 MED ORDER — LIDOCAINE HCL 2 % IJ SOLN
INTRAMUSCULAR | Status: AC
  Filled 2018-04-29: qty 20

## 2018-04-29 MED ORDER — ACETAMINOPHEN 500 MG PO TABS
1000.0000 mg | ORAL_TABLET | Freq: Four times a day (QID) | ORAL | Status: DC
  Administered 2018-04-29 – 2018-05-02 (×11): 1000 mg via ORAL
  Filled 2018-04-29 (×12): qty 2

## 2018-04-29 MED ORDER — SCOPOLAMINE 1 MG/3DAYS TD PT72
MEDICATED_PATCH | TRANSDERMAL | Status: AC
  Filled 2018-04-29: qty 1

## 2018-04-29 MED ORDER — DIPHENHYDRAMINE HCL 50 MG/ML IJ SOLN: 12.5000 mg | Freq: Four times a day (QID) | INTRAMUSCULAR | Status: DC | PRN

## 2018-04-29 MED ORDER — SUGAMMADEX SODIUM 200 MG/2ML IV SOLN
INTRAVENOUS | Status: AC
  Filled 2018-04-29: qty 2

## 2018-04-29 MED ORDER — PHENYLEPHRINE 40 MCG/ML (10ML) SYRINGE FOR IV PUSH (FOR BLOOD PRESSURE SUPPORT)
PREFILLED_SYRINGE | INTRAVENOUS | Status: AC
  Filled 2018-04-29: qty 10

## 2018-04-29 MED ORDER — ACETAMINOPHEN 500 MG PO TABS
1000.0000 mg | ORAL_TABLET | ORAL | Status: AC
  Administered 2018-04-29: 1000 mg via ORAL
  Filled 2018-04-29: qty 2

## 2018-04-29 MED ORDER — KCL IN DEXTROSE-NACL 20-5-0.45 MEQ/L-%-% IV SOLN
INTRAVENOUS | Status: AC
  Administered 2018-05-01: via INTRAVENOUS
  Filled 2018-04-29: qty 1000

## 2018-04-29 MED ORDER — LACTATED RINGERS IV SOLN
INTRAVENOUS | Status: DC
  Administered 2018-04-29 (×3): via INTRAVENOUS

## 2018-04-29 MED ORDER — EPHEDRINE 5 MG/ML INJ
INTRAVENOUS | Status: AC
  Filled 2018-04-29: qty 10

## 2018-04-29 MED ORDER — GABAPENTIN 300 MG PO CAPS
300.0000 mg | ORAL_CAPSULE | ORAL | Status: AC
  Administered 2018-04-29: 300 mg via ORAL
  Filled 2018-04-29: qty 1

## 2018-04-29 MED ORDER — IOHEXOL 300 MG/ML  SOLN
INTRAMUSCULAR | Status: DC | PRN
  Administered 2018-04-29: 5 mL via INTRAVENOUS

## 2018-04-29 MED ORDER — SUGAMMADEX SODIUM 200 MG/2ML IV SOLN
INTRAVENOUS | Status: DC | PRN
  Administered 2018-04-29: 100 mg via INTRAVENOUS

## 2018-04-29 MED ORDER — NITROFURANTOIN MONOHYD MACRO 100 MG PO CAPS: 100.0000 mg | ORAL_CAPSULE | Freq: Two times a day (BID) | ORAL | Status: DC

## 2018-04-29 MED ORDER — LIDOCAINE 2% (20 MG/ML) 5 ML SYRINGE
INTRAMUSCULAR | Status: AC
  Filled 2018-04-29: qty 5

## 2018-04-29 MED ORDER — FENTANYL CITRATE (PF) 100 MCG/2ML IJ SOLN
INTRAMUSCULAR | Status: DC | PRN
  Administered 2018-04-29: 100 ug via INTRAVENOUS
  Administered 2018-04-29 (×2): 50 ug via INTRAVENOUS
  Administered 2018-04-29: 100 ug via INTRAVENOUS

## 2018-04-29 MED ORDER — LACTATED RINGERS IR SOLN
Status: DC | PRN
  Administered 2018-04-29: 1000 mL

## 2018-04-29 MED ORDER — MIDAZOLAM HCL 2 MG/2ML IJ SOLN
INTRAMUSCULAR | Status: AC
  Filled 2018-04-29: qty 2

## 2018-04-29 SURGICAL SUPPLY — 129 items
ADH SKN CLS APL DERMABOND .7 (GAUZE/BANDAGES/DRESSINGS) ×4
APL SKNCLS STERI-STRIP NONHPOA (GAUZE/BANDAGES/DRESSINGS)
ATTRACTOMAT 16X20 MAGNETIC DRP (DRAPES) ×1 IMPLANT
BAG SPEC RTRVL LRG 6X4 10 (ENDOMECHANICALS)
BAG URO CATCHER STRL LF (MISCELLANEOUS) ×6 IMPLANT
BANDAGE ADH SHEER 1  50/CT (GAUZE/BANDAGES/DRESSINGS) ×1 IMPLANT
BASKET ZERO TIP NITINOL 2.4FR (BASKET) ×5 IMPLANT
BENZOIN TINCTURE PRP APPL 2/3 (GAUZE/BANDAGES/DRESSINGS) IMPLANT
BLADE EXTENDED COATED 6.5IN (ELECTRODE) ×1 IMPLANT
BRR ADH 6X5 SEPRAFILM 1 SHT (MISCELLANEOUS)
BSKT STON RTRVL ZERO TP 2.4FR (BASKET) ×4
CABLE HIGH FREQUENCY MONO STRZ (ELECTRODE) IMPLANT
CATH URET 5FR 28IN OPEN ENDED (CATHETERS) ×6 IMPLANT
CELLS DAT CNTRL 66122 CELL SVR (MISCELLANEOUS) ×4 IMPLANT
CHLORAPREP W/TINT 26ML (MISCELLANEOUS) ×1 IMPLANT
CLIP VESOCCLUDE LG 6/CT (CLIP) ×1 IMPLANT
CLIP VESOCCLUDE MED 6/CT (CLIP) ×1 IMPLANT
CLOSURE WOUND 1/2 X4 (GAUZE/BANDAGES/DRESSINGS)
CLOTH BEACON ORANGE TIMEOUT ST (SAFETY) ×6 IMPLANT
CONT SPEC 4OZ CLIKSEAL STRL BL (MISCELLANEOUS) IMPLANT
COVER SURGICAL LIGHT HANDLE (MISCELLANEOUS) ×12 IMPLANT
DECANTER SPIKE VIAL GLASS SM (MISCELLANEOUS) IMPLANT
DERMABOND ADVANCED (GAUZE/BANDAGES/DRESSINGS) ×2
DERMABOND ADVANCED .7 DNX12 (GAUZE/BANDAGES/DRESSINGS) ×4 IMPLANT
DRAPE INCISE IOBAN 66X45 STRL (DRAPES) IMPLANT
DRAPE UNDERBUTTOCKS STRL (DRAPE) ×1 IMPLANT
DRAPE UTILITY XL STRL (DRAPES) IMPLANT
DRAPE WARM FLUID 44X44 (DRAPE) ×1 IMPLANT
DRSG OPSITE POSTOP 4X6 (GAUZE/BANDAGES/DRESSINGS) ×5 IMPLANT
ELECT PENCIL ROCKER SW 15FT (MISCELLANEOUS) ×11 IMPLANT
ELECT REM PT RETURN 15FT ADLT (MISCELLANEOUS) ×12 IMPLANT
GAUZE 4X4 16PLY RFD (DISPOSABLE) IMPLANT
GAUZE SPONGE 4X4 12PLY STRL (GAUZE/BANDAGES/DRESSINGS) IMPLANT
GLOVE BIO SURGEON STRL SZ 6 (GLOVE) ×18 IMPLANT
GLOVE BIO SURGEON STRL SZ 6.5 (GLOVE) ×5 IMPLANT
GLOVE BIO SURGEONS STRL SZ 6.5 (GLOVE) ×1
GLOVE BIOGEL M STRL SZ7.5 (GLOVE) ×6 IMPLANT
GLOVE BIOGEL PI IND STRL 7.0 (GLOVE) ×8 IMPLANT
GLOVE BIOGEL PI INDICATOR 7.0 (GLOVE) ×4
GLOVE INDICATOR 6.5 STRL GRN (GLOVE) ×12 IMPLANT
GLOVE SURG SS PI 6.5 STRL IVOR (GLOVE) ×12 IMPLANT
GOWN SRG 2XL LVL 4 BRTHBL STRL (GOWNS) IMPLANT
GOWN STRL NON-REIN 2XL LVL4 (GOWNS)
GOWN STRL NON-REIN LRG LVL3 (GOWN DISPOSABLE) ×2 IMPLANT
GOWN STRL REUS W/ TWL LRG LVL3 (GOWN DISPOSABLE) ×2 IMPLANT
GOWN STRL REUS W/ TWL XL LVL3 (GOWN DISPOSABLE) ×33 IMPLANT
GOWN STRL REUS W/TWL 2XL LVL3 (GOWN DISPOSABLE) ×12 IMPLANT
GOWN STRL REUS W/TWL LRG LVL3 (GOWN DISPOSABLE) ×6 IMPLANT
GOWN STRL REUS W/TWL XL LVL3 (GOWN DISPOSABLE) ×60
GUIDEWIRE STR DUAL SENSOR (WIRE) ×6 IMPLANT
HANDLE SUCTION POOLE (INSTRUMENTS) ×4 IMPLANT
HEMOSTAT SURGICEL 4X8 (HEMOSTASIS) IMPLANT
HOLDER FOLEY CATH W/STRAP (MISCELLANEOUS) ×5 IMPLANT
IRRIG SUCT STRYKERFLOW 2 WTIP (MISCELLANEOUS) ×6
IRRIGATION SUCT STRKRFLW 2 WTP (MISCELLANEOUS) ×3 IMPLANT
KIT BASIN OR (CUSTOM PROCEDURE TRAY) ×7 IMPLANT
L-HOOK LAP DISP 36CM (ELECTROSURGICAL) ×6
LHOOK LAP DISP 36CM (ELECTROSURGICAL) ×4 IMPLANT
LIGASURE IMPACT 36 18CM CVD LR (INSTRUMENTS) ×1 IMPLANT
MANIFOLD NEPTUNE II (INSTRUMENTS) ×6 IMPLANT
MANIPULATOR UTERINE 4.5 ZUMI (MISCELLANEOUS) IMPLANT
NEEDLE HYPO 22GX1.5 SAFETY (NEEDLE) ×12 IMPLANT
NS IRRIG 1000ML POUR BTL (IV SOLUTION) ×12 IMPLANT
PACK CYSTO (CUSTOM PROCEDURE TRAY) ×6 IMPLANT
PACK GENERAL/GYN (CUSTOM PROCEDURE TRAY) ×6 IMPLANT
PORT LAP GEL ALEXIS MED 5-9CM (MISCELLANEOUS) ×5 IMPLANT
POUCH SPECIMEN RETRIEVAL 10MM (ENDOMECHANICALS) IMPLANT
RELOAD PROXIMATE 75MM BLUE (ENDOMECHANICALS) ×12 IMPLANT
RETAINER VISCERA MED (MISCELLANEOUS) IMPLANT
RETRACTOR WND ALEXIS 18 MED (MISCELLANEOUS) ×1 IMPLANT
RTRCTR WOUND ALEXIS 18CM MED (MISCELLANEOUS) ×6
SCISSORS LAP 5X35 DISP (ENDOMECHANICALS) IMPLANT
SEALER TISSUE G2 STRG ARTC 35C (ENDOMECHANICALS) ×5 IMPLANT
SEPRAFILM MEMBRANE 5X6 (MISCELLANEOUS) IMPLANT
SHEARS HARMONIC ACE PLUS 36CM (ENDOMECHANICALS) IMPLANT
SHEET LAVH (DRAPES) ×6 IMPLANT
SLEEVE XCEL OPT CAN 5 100 (ENDOMECHANICALS) ×15 IMPLANT
SOLUTION ANTI FOG 6CC (MISCELLANEOUS) ×6 IMPLANT
SPONGE LAP 18X18 RF (DISPOSABLE) ×5 IMPLANT
STAPLER GUN LINEAR PROX 60 (STAPLE) ×10 IMPLANT
STAPLER PROXIMATE 75MM BLUE (STAPLE) ×5 IMPLANT
STENT URET 6FRX24 CONTOUR (STENTS) ×5 IMPLANT
STRIP CLOSURE SKIN 1/2X4 (GAUZE/BANDAGES/DRESSINGS) IMPLANT
SUCTION POOLE HANDLE (INSTRUMENTS) ×6
SUCTION POOLE TIP (SUCTIONS) ×6 IMPLANT
SUT MNCRL AB 4-0 PS2 18 (SUTURE) ×14 IMPLANT
SUT PDS AB 1 CT1 27 (SUTURE) ×12 IMPLANT
SUT PDS AB 1 TP1 96 (SUTURE) ×2 IMPLANT
SUT PLAIN 2 0 XLH (SUTURE) IMPLANT
SUT SILK 2 0 (SUTURE) ×6
SUT SILK 2 0 SH CR/8 (SUTURE) ×4 IMPLANT
SUT SILK 2-0 18XBRD TIE 12 (SUTURE) ×4 IMPLANT
SUT SILK 3 0 (SUTURE) ×6
SUT SILK 3 0 SH CR/8 (SUTURE) ×5 IMPLANT
SUT SILK 3-0 18XBRD TIE 12 (SUTURE) ×4 IMPLANT
SUT VIC AB 0 CT1 18XCR BRD 8 (SUTURE) ×4 IMPLANT
SUT VIC AB 0 CT1 36 (SUTURE) ×6 IMPLANT
SUT VIC AB 0 CT1 8-18 (SUTURE) ×6
SUT VIC AB 2-0 CT2 27 (SUTURE) IMPLANT
SUT VIC AB 2-0 SH 18 (SUTURE) ×5 IMPLANT
SUT VIC AB 2-0 SH 27 (SUTURE)
SUT VIC AB 2-0 SH 27X BRD (SUTURE) IMPLANT
SUT VIC AB 3-0 PS2 18 (SUTURE)
SUT VIC AB 3-0 PS2 18XBRD (SUTURE) IMPLANT
SUT VIC AB 3-0 SH 18 (SUTURE) ×6 IMPLANT
SUT VIC AB 3-0 SH 27 (SUTURE) ×6
SUT VIC AB 3-0 SH 27X BRD (SUTURE) ×3 IMPLANT
SUT VIC AB 4-0 PS2 18 (SUTURE) ×12 IMPLANT
SUT VIC AB 4-0 PS2 27 (SUTURE) IMPLANT
SUT VICRYL 0 TIES 12 18 (SUTURE) ×6 IMPLANT
SUT VICRYL 0 UR6 27IN ABS (SUTURE) ×4 IMPLANT
SYR 30ML LL (SYRINGE) ×12 IMPLANT
TOWEL OR 17X26 10 PK STRL BLUE (TOWEL DISPOSABLE) ×24 IMPLANT
TOWEL OR NON WOVEN STRL DISP B (DISPOSABLE) ×6 IMPLANT
TRAY FOLEY MTR SLVR 14FR STAT (SET/KITS/TRAYS/PACK) ×6 IMPLANT
TRAY FOLEY MTR SLVR 16FR STAT (SET/KITS/TRAYS/PACK) ×6 IMPLANT
TRAY LAPAROSCOPIC (CUSTOM PROCEDURE TRAY) ×12 IMPLANT
TROCAR XCEL 12X100 BLDLESS (ENDOMECHANICALS) ×6 IMPLANT
TROCAR XCEL BLUNT TIP 100MML (ENDOMECHANICALS) ×6 IMPLANT
TROCAR XCEL NON-BLD 11X100MML (ENDOMECHANICALS) IMPLANT
TROCAR XCEL UNIV SLVE 11M 100M (ENDOMECHANICALS) IMPLANT
TUBE CONNECTING VINYL 14FR 30C (TUBING) ×5 IMPLANT
TUBING CONNECTING 10 (TUBING) IMPLANT
TUBING CONNECTING 10' (TUBING)
TUBING INSUF HEATED (TUBING) ×12 IMPLANT
TUBING NON-CON 1/4 X 20 CONN (TUBING) IMPLANT
TUBING NON-CON 1/4 X 20' CONN (TUBING)
WATER STERILE IRR 1000ML POUR (IV SOLUTION) ×6 IMPLANT
YANKAUER SUCT BULB TIP NO VENT (SUCTIONS) ×6 IMPLANT

## 2018-04-29 NOTE — Anesthesia Procedure Notes (Signed)
Procedure Name: Intubation Date/Time: 04/29/2018 8:50 AM Performed by: Claudia Desanctis, CRNA Pre-anesthesia Checklist: Patient identified, Emergency Drugs available, Suction available and Patient being monitored Patient Re-evaluated:Patient Re-evaluated prior to induction Oxygen Delivery Method: Circle system utilized Preoxygenation: Pre-oxygenation with 100% oxygen Induction Type: IV induction Ventilation: Mask ventilation without difficulty Laryngoscope Size: 2 and Miller Grade View: Grade I Tube type: Oral Tube size: 7.0 mm Number of attempts: 1 Airway Equipment and Method: Stylet Placement Confirmation: ETT inserted through vocal cords under direct vision,  positive ETCO2 and breath sounds checked- equal and bilateral Secured at: 21 cm Tube secured with: Tape Dental Injury: Teeth and Oropharynx as per pre-operative assessment

## 2018-04-29 NOTE — Interval H&P Note (Signed)
History and Physical Interval Note:  04/29/2018 7:35 AM  Ayana Feliz Beam  has presented today for surgery, with the diagnosis of abdominal mass right lower quadrant  The various methods of treatment have been discussed with the patient and family. After consideration of risks, benefits and other options for treatment, the patient has consented to  Procedure(s): LAPAROSCOPIC VS OPEN REMOVAL ABDOMINAL MASS AND ASSOCIATED ORGANS ERAS PATHWAY (N/A) CYSTOSCOPY WITH  BILATERAL STENT PLACEMENT (Bilateral) POSSIBLE SALPINGO OOPHORECTOMY (Bilateral) POSSIBLE MYOMECTOMY (N/A) POSSIBLE  HYSTERECTOMY TOTAL LAPAROSCOPIC (N/A) as a surgical intervention .  The patient's history has been reviewed, patient examined, no change in status, stable for surgery.  I have reviewed the patient's chart and labs.  Questions were answered to the patient's satisfaction.     Stark Klein

## 2018-04-29 NOTE — Transfer of Care (Signed)
Immediate Anesthesia Transfer of Care Note  Patient: Erica Ross  Procedure(s) Performed: LAPAROSCOPIC RIGHT HEMI-COLECTOMY ERAS PATHWAY (N/A Abdomen) CYSTOSCOPY WITH  BILATERAL RETROGRADE PYELOGRAM RIGHT STENT PLACEMENT (Bilateral Urethra)  Patient Location: PACU  Anesthesia Type:General  Level of Consciousness: drowsy  Airway & Oxygen Therapy: Patient Spontanous Breathing and Patient connected to face mask  Post-op Assessment: Report given to RN and Post -op Vital signs reviewed and stable  Post vital signs: Reviewed and stable  Last Vitals:  Vitals Value Taken Time  BP 108/60 04/29/2018 11:33 AM  Temp    Pulse 90 04/29/2018 11:34 AM  Resp 12 04/29/2018 11:34 AM  SpO2 100 % 04/29/2018 11:34 AM  Vitals shown include unvalidated device data.  Last Pain:  Vitals:   04/29/18 0700  TempSrc: Oral  PainSc: 0-No pain      Patients Stated Pain Goal: 4 (06/21/14 9458)  Complications: No apparent anesthesia complications

## 2018-04-29 NOTE — Op Note (Signed)
Diagnostic laparoscopy, laparoscopic partial colectomy (R hemicolectomy), repair of umbilical hernia  Indications: This patient presents with a calcified pelvic mass of uncertain etiology.  It appeared to be causing hydronephrosis.  Possibilities for the mass were pedunculated uterine fibroid, ovarian fibroma, carcinoid tumor of appendix or small bowel, GIST or other.  Because of this, Right ureteral stent was placed by Dr. Louis Meckel.  Dr. Gerarda Fraction (gyn onc) was scrubbed in order to be prepared to take the ovary/fibroid/and/or uterus depending on the origin.    Pre-operative Diagnosis: pelvic mass as above.     Post-operative Diagnosis: Same  Surgeon: Stark Klein   Assistants: Carlena Hurl, PA-C  Anesthesia: General endotracheal anesthesia   ASA Class: 2   Procedure Details  The patient was seen in the Holding Room. The risks, benefits, complications, treatment options, and expected outcomes were discussed with the patient.The patient was taken to the operating room, identified, and the procedure verified as removal of abdominal mass and associated organs. A Time Out was held and the above information confirmed.   The patient was placed in low lithotomy position. After induction of a general anesthetic, Dr. Louis Meckel did cystoscopy and placed right ureteral stent.  A foley catheter was also placed.   At that point, the abdomen was prepped and draped in standard fashion. Local anesthetic was infiltrated at the left costal margin. A 5 mm Optiview trocar was placed into the abdomen under direct visualization. Pneumoperitoneum was insufflated to a pressure of 15 mm Hg. The laparoscope was introduced.   Exploration revealed a normal omentum, colon, small bowel, peritoneum, liver, and stomach. Two additional left lateral and one right lateral 5-mm trocars were then placed after anesthetizing the skin and peritoneum with Marcaine. The tumor was visible.  The bowels were retraced in cephalad direction  and it became quickly apparent that the tumor was in the mesentery or colon wall near the terminal ileus.    The ascending colon and hepatic flexure were then mobilized with gentle retraction of the colon in a medial direction with mobilization of the peritoneal reflection with cautery and the Enseal. Mobilization of this area was complete to expose the retroperitoneum. There was minimal blood loss during this portion of the procedure. The duodenum was avoided. The omentum was taken off the middle and proximal transverse colon with the Enseal and mobilized medially.    After completing mobilization, a 4-5 cm incision was made in the midline just above the umbilicus.  An alexis wound protector was placed to protect the skin, and the colon was delivered through the incision. The terminal ileum and colon was resected with a linear stapling device proximal and distal to the area in question in regard to the specimen. The mesenteric vessels were divided with the EnSeal.  The ileocolic artery/vein was clamped, cut, and tied with 2-0 vicryl ties. The specimen was submitted to pathology.   An end-to-end anastomosis was performed through the small anterior incision with the linear stapling device. The mucosa was inspected and found to be hemostatic. Closure was achieved with the transverse stapling device. A 3-0 Vicryl suture was used to reapproximate the angle of the anastomosis. Hemostasis was confirmed. The mesenteric defect was too large to close, so it was left open.    The fascial incision was then closed with a running looped 0-PDS suture. The soft tissue was irrigated and the incisions were closed with 4-0 Vicryl subcuticular closure. Dermabond was applied.   Instrument, sponge, and needle counts were correct prior to  abdominal closure and at the conclusion of the case.   Findings: 5 mm mucin deposit on ileocolic mesentery, large firm calcified mass in cecal wall/mesentery near terminal ileus.     Estimated Blood Loss: Minimal   Specimens: R colon with mass  Complications: None; patient tolerated the procedure well.   Disposition: PACU - hemodynamically stable.   Condition: stable

## 2018-04-29 NOTE — H&P (Signed)
CC: Right hydronephrosis.  HPI: 42 year old female who was having right-sided abdominal pain and underwent a CT scan of the abdomen and pelvis on 02/28/2018. This revealed a 5.8 cm calcified mass in the right lower quadrant that was superior to the right adnexal region felt to be mesenteric mass versus ovarian in origin. The mass was causing mild extrinsic compression of the right ureter resulting in mild hydronephrosis which is why she was referred to me. She also had a 5 mm nonobstructing renal calculus on the right. Left side was normal. Creatinine was 0.67 on 02/10/2018. Patient states that she has occasional, intermittent right-sided lower back pain that radiates down her buttock. She has no right-sided flank pain. She has no voiding complaints. She has not seen a surgical oncologist or a gynecologist for this.     ALLERGIES: Amoxicillin    MEDICATIONS: Cephalexin 250 mg tablet 1 tablet PO Daily  Fish Oil 1 PO Daily  Probiotic 1 PO Daily  Vitamin D3 1 PO Daily     GU PSH: None   NON-GU PSH: None   GU PMH: None   NON-GU PMH: Hypercholesterolemia    FAMILY HISTORY: 1 Daughter - Daughter 2 sons - Son Aneurysm - Father Diabetes - Runs in Family Hypertension - Runs in Family    Notes: Mother still living at age 33  Father still living at age 66   SOCIAL HISTORY: Marital Status: Married Current Smoking Status: Patient has never smoked.   Tobacco Use Assessment Completed: Used Tobacco in last 30 days? Drinks 2 caffeinated drinks per day. Patient's occupation is/was [employer].    REVIEW OF SYSTEMS:    GU Review Female:   Patient denies have to strain to urinate, frequent urination, being pregnant, hard to postpone urination, get up at night to urinate, burning /pain with urination, trouble starting your stream, leakage of urine, and stream starts and stops.  Gastrointestinal (Upper):   Patient reports nausea and indigestion/ heartburn. Patient denies vomiting.  Gastrointestinal  (Lower):   Patient denies diarrhea and constipation.  Constitutional:   Patient reports weight loss and fatigue. Patient denies fever and night sweats.  Skin:   Patient denies skin rash/ lesion and itching.  Eyes:   Patient denies blurred vision and double vision.  Ears/ Nose/ Throat:   Patient denies sore throat and sinus problems.  Hematologic/Lymphatic:   Patient denies swollen glands and easy bruising.  Cardiovascular:   Patient denies leg swelling and chest pains.  Respiratory:   Patient denies cough and shortness of breath.  Endocrine:   Patient denies excessive thirst.  Musculoskeletal:   Patient reports back pain. Patient denies joint pain.  Neurological:   Patient denies headaches and dizziness.  Psychologic:   Patient reports anxiety. Patient denies depression.   VITAL SIGNS:      03/05/2018 08:36 AM  Weight 133.8 lb / 60.69 kg  Height 64 in / 162.56 cm  BP 121/77 mmHg  Pulse 83 /min  Temperature 99.3 F / 37.3 C  BMI 23.0 kg/m   MULTI-SYSTEM PHYSICAL EXAMINATION:    Constitutional: Well-nourished. No physical deformities. Normally developed. Good grooming.  Respiratory: No labored breathing, no use of accessory muscles.   Cardiovascular: Normal temperature, adequate perfusion of extremities  Skin: No paleness, no jaundice  Neurologic / Psychiatric: Oriented to time, oriented to place, oriented to person. No depression, no anxiety, no agitation.  Gastrointestinal: No mass, no tenderness, no rigidity, non obese abdomen.  Eyes: Normal conjunctivae. Normal eyelids.  Musculoskeletal: Normal gait and  station of head and neck.     PAST DATA REVIEWED:  Source Of History:  Patient  Lab Test Review:   BMP  X-Ray Review: Pelvic Ultrasound: Reviewed Films. Reviewed Report. Discussed With Patient.  C.T. Abdomen/Pelvis: Reviewed Films. Reviewed Report. Discussed With Patient.     PROCEDURES:          Urinalysis Dipstick Dipstick Cont'd  Color: Yellow Bilirubin: Neg mg/dL   Appearance: Clear Ketones: 2+ mg/dL  Specific Gravity: 1.015 Blood: Neg ery/uL  pH: <=5.0 Protein: Trace mg/dL  Glucose: Neg mg/dL Urobilinogen: 0.2 mg/dL    Nitrites: Neg    Leukocyte Esterase: Neg leu/uL    ASSESSMENT:      ICD-10 Details  1 GU:   Hydronephrosis - N13.0   2   Renal calculus - N20.0    PLAN:           Schedule Return Visit/Planned Activity: ASAP - Consult Refer to physician - Nadeen Landau, MD  Return Visit/Planned Activity: ASAP - Consult Refer to physician - Lenard Simmer. Denman George, MD          Document Letter(s):  Created for Patient: Clinical Summary         Notes:   The patient only has mild hydronephrosis, she is minimally symptomatic, and her creatinine is normal. Therefore do not recommend urgent ureteral stent. Rather, would recommend surgical consultation with either surgical oncology or gynecological oncology for consideration of removal which would remove the extrinsic compression. At that time, I would be happy to assist with ureteral stent placement given the close proximity to the ureter. Plan to refer her to Dr. Nadeen Landau with surgical oncology and Dr. Everitt Amber with gynecologic oncology.

## 2018-04-29 NOTE — Anesthesia Postprocedure Evaluation (Signed)
Anesthesia Post Note  Patient: Erica Ross  Procedure(s) Performed: LAPAROSCOPIC RIGHT HEMI-COLECTOMY ERAS PATHWAY (N/A Abdomen) CYSTOSCOPY WITH  BILATERAL RETROGRADE PYELOGRAM RIGHT STENT PLACEMENT (Bilateral Urethra)     Patient location during evaluation: PACU Anesthesia Type: General Level of consciousness: awake and alert Pain management: pain level controlled Vital Signs Assessment: post-procedure vital signs reviewed and stable Respiratory status: spontaneous breathing, nonlabored ventilation, respiratory function stable and patient connected to nasal cannula oxygen Cardiovascular status: blood pressure returned to baseline and stable Postop Assessment: no apparent nausea or vomiting Anesthetic complications: no    Last Vitals:  Vitals:   04/29/18 1215 04/29/18 1230  BP: 113/75 117/77  Pulse: 94 (!) 102  Resp: 11 11  Temp:    SpO2: 100% 100%    Last Pain:  Vitals:   04/29/18 1230  TempSrc:   PainSc: 3                  Tiajuana Amass

## 2018-04-29 NOTE — Progress Notes (Signed)
Pt passed out while sitting on the toilet  Vitals: Bp: 102/67, HR: 89, O2: 100%, temp: 98.0. Pt taken back to bed and is now sleeping. MD paged awaiting response.

## 2018-04-29 NOTE — Anesthesia Preprocedure Evaluation (Addendum)
Anesthesia Evaluation  Patient identified by MRN, date of birth, ID band Patient awake    Reviewed: Allergy & Precautions, NPO status , Patient's Chart, lab work & pertinent test results  Airway Mallampati: II  TM Distance: >3 FB Neck ROM: Full    Dental  (+) Dental Advisory Given   Pulmonary neg pulmonary ROS,    breath sounds clear to auscultation       Cardiovascular negative cardio ROS   Rhythm:Regular Rate:Normal     Neuro/Psych negative neurological ROS     GI/Hepatic Neg liver ROS, GERD  ,Abdominal mass   Endo/Other  negative endocrine ROS  Renal/GU negative Renal ROS     Musculoskeletal   Abdominal   Peds  Hematology negative hematology ROS (+)   Anesthesia Other Findings   Reproductive/Obstetrics                             Lab Results  Component Value Date   WBC 8.3 04/22/2018   HGB 13.4 04/22/2018   HCT 40.0 04/22/2018   MCV 90.1 04/22/2018   PLT 227 04/22/2018   Lab Results  Component Value Date   CREATININE 0.61 04/22/2018   BUN 18 04/22/2018   NA 142 04/22/2018   K 3.9 04/22/2018   CL 107 04/22/2018   CO2 26 04/22/2018    Anesthesia Physical Anesthesia Plan  ASA: II  Anesthesia Plan: General   Post-op Pain Management:    Induction: Intravenous  PONV Risk Score and Plan: 4 or greater and Scopolamine patch - Pre-op, Dexamethasone, Ondansetron and Treatment may vary due to age or medical condition  Airway Management Planned: Oral ETT  Additional Equipment:   Intra-op Plan:   Post-operative Plan: Extubation in OR  Informed Consent: I have reviewed the patients History and Physical, chart, labs and discussed the procedure including the risks, benefits and alternatives for the proposed anesthesia with the patient or authorized representative who has indicated his/her understanding and acceptance.   Dental advisory given  Plan Discussed with:  CRNA  Anesthesia Plan Comments:         Anesthesia Quick Evaluation

## 2018-04-29 NOTE — Op Note (Signed)
Preoperative diagnosis:  1. Right pelvic mass 2. Right hydronephrosis   Postoperative diagnosis:  1. same  Procedure: 2. Cystoscopy, bilateral retrograde pyelogram with interpretation 3. Right ureteral stent placement   Surgeon: Ardis Hughs, MD  Anesthesia: General  Complications: None  Intraoperative findings:  #1: The left retrograde pyelogram was performed using 5 cc of Omnipaque contrast through 5 [place] open-ended ureteral catheter demonstrating a normal caliber ureter with no filling defects no hydronephrosis and sharp calyces. 2.:  The right retrograde pyelogram demonstrated some mild hydronephrosis down to the UVJ.  The ureter was deviated medially.  There may be a small transition point in the area of the pelvic brim with proximal hydroureteronephrosis and mild calyceal blunting. #3: A 6 French x24 cm double-J stent was placed in the right ureter.  EBL: Minimal  Specimens: None  Indication: Erica Ross is a 42 y.o. patient with large right pelvic mass with associated right hydroureteronephrosis.  After reviewing the management options for treatment, he elected to proceed with the above surgical procedure(s). We have discussed the potential benefits and risks of the procedure, side effects of the proposed treatment, the likelihood of the patient achieving the goals of the procedure, and any potential problems that might occur during the procedure or recuperation. Informed consent has been obtained.  Description of procedure:  The patient was taken to the operating room and general anesthesia was induced.  The patient was placed in the dorsal lithotomy position, prepped and draped in the usual sterile fashion, and preoperative antibiotics were administered. A preoperative time-out was performed.   21 French 30 degrees cystoscope was gently passed to the patient's urethra and into the bladder under visual guidance.  360 degrees cystoscopic evaluation was performed of  draining normal mucosa with no significant bladder abnormalities.  The ureter orifice ease were orthotopic.  A 5 French open-ended catheter was used to perform left retrograde pyelogram with the above findings.  I then performed a right retrograde pyelogram with a similar fashion with the above findings.  I then advanced a 0.038 sensor wire through the open-ended catheter in the right ureter and up into the right renal pelvis.  The catheter was then removed with a wire exchange for a 6 [place] x24 cm double-J ureteral stent.  This wa passed up into the right renal pelvis under fluoroscopic guidance with a nice curl noted in the renal pelvis.  The stent was then advanced to the bladder neck and wire removed leaving a nice curl within the bladder.  A 16 French Foley catheter was subsequently placed.  Stent tether was taken off.  The patient tolerated this procedure well.  The remainder of the case will be dictated by Dr. Barry Dienes.  Ardis Hughs, M.D.

## 2018-04-29 NOTE — Op Note (Signed)
I was present with Dr. Barry Dienes at the time of diagnostic laparoscopy and agreed that the mass in question appeared to arise in the [facility]. Her bilateral ovaries and fallopian tubes appeared normal. Her uterus appeared normal.  I deferred further surgical management to Dr.Byerly.

## 2018-04-30 ENCOUNTER — Encounter (HOSPITAL_COMMUNITY): Payer: Self-pay | Admitting: General Surgery

## 2018-04-30 LAB — CBC
HCT: 24 % — ABNORMAL LOW (ref 36.0–46.0)
Hemoglobin: 7.9 g/dL — ABNORMAL LOW (ref 12.0–15.0)
MCH: 30.4 pg (ref 26.0–34.0)
MCHC: 32.9 g/dL (ref 30.0–36.0)
MCV: 92.3 fL (ref 78.0–100.0)
PLATELETS: 207 10*3/uL (ref 150–400)
RBC: 2.6 MIL/uL — ABNORMAL LOW (ref 3.87–5.11)
RDW: 14.6 % (ref 11.5–15.5)
WBC: 13.9 10*3/uL — ABNORMAL HIGH (ref 4.0–10.5)

## 2018-04-30 LAB — BASIC METABOLIC PANEL
Anion gap: 6 (ref 5–15)
BUN: 7 mg/dL (ref 6–20)
CALCIUM: 8.5 mg/dL — AB (ref 8.9–10.3)
CO2: 28 mmol/L (ref 22–32)
CREATININE: 0.58 mg/dL (ref 0.44–1.00)
Chloride: 106 mmol/L (ref 98–111)
GFR calc Af Amer: >60 mL/min (ref >60)
Glucose, Bld: 185 mg/dL — ABNORMAL HIGH (ref 70–99)
Potassium: 4.5 mmol/L (ref 3.5–5.1)
Sodium: 140 mmol/L (ref 135–145)

## 2018-04-30 NOTE — Progress Notes (Signed)
1 Day Post-Op   Subjective/Chief Complaint: Pt urinated without foley.  Had a near syncope event yesterday that resolved with fluid. HCT down this AM.  No n/v.  No flatus yet, but belching present.    Objective: Vital signs in last 24 hours: Temp:  [97.5 F (36.4 C)-99.3 F (37.4 C)] 98.5 F (36.9 C) (09/12 0610) Pulse Rate:  [62-121] 89 (09/12 0610) Resp:  [11-18] 18 (09/12 0610) BP: (79-121)/(52-93) 103/66 (09/12 0610) SpO2:  [100 %] 100 % (09/12 0610) Weight:  [63.5 kg] 63.5 kg (09/12 0614) Last BM Date: 04/29/18  Intake/Output from previous day: 09/11 0701 - 09/12 0700 In: 5524.1 [P.O.:720; I.V.:3595.7; IV Piggyback:1208.3] Out: 1607 [Urine:1750; Blood:25] Intake/Output this shift: No intake/output data recorded.  General appearance: alert, cooperative and no distress Resp: breathing comfortably GI:  Soft, approp tender, mildly distended.  Dressing c/d/i.   Lab Results:  Recent Labs    04/29/18 1228 04/30/18 0416  WBC 17.3* 13.9*  HGB 12.5 7.9*  HCT 37.9 24.0*  PLT 245 207   BMET Recent Labs    04/29/18 1228 04/30/18 0416  NA  --  140  K  --  4.5  CL  --  106  CO2  --  28  GLUCOSE  --  185*  BUN  --  7  CREATININE 0.52 0.58  CALCIUM  --  8.5*   PT/INR No results for input(s): LABPROT, INR in the last 72 hours. ABG No results for input(s): PHART, HCO3 in the last 72 hours.  Invalid input(s): PCO2, PO2  Studies/Results: Dg C-arm 1-60 Min-no Report  Result Date: 04/29/2018 Fluoroscopy was utilized by the requesting physician.  No radiographic interpretation.    Anti-infectives: Anti-infectives (From admission, onward)   Start     Dose/Rate Route Frequency Ordered Stop   04/29/18 1600  clindamycin (CLEOCIN) IVPB 900 mg     900 mg 100 mL/hr over 30 Minutes Intravenous Every 8 hours 04/29/18 1439 04/29/18 1634   04/29/18 0645  ciprofloxacin (CIPRO) IVPB 400 mg     400 mg 200 mL/hr over 60 Minutes Intravenous On call to O.R. 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  Fulls Pain control Await return of bowel function Await pathology.   LOS: 1 day    Erica Ross 04/30/2018

## 2018-05-01 LAB — HEMOGLOBIN AND HEMATOCRIT, BLOOD
HCT: 24 % — ABNORMAL LOW (ref 36.0–46.0)
Hemoglobin: 7.8 g/dL — ABNORMAL LOW (ref 12.0–15.0)

## 2018-05-01 LAB — BASIC METABOLIC PANEL
Anion gap: 4 — ABNORMAL LOW (ref 5–15)
BUN: 9 mg/dL (ref 6–20)
CHLORIDE: 108 mmol/L (ref 98–111)
CO2: 29 mmol/L (ref 22–32)
CREATININE: 0.5 mg/dL (ref 0.44–1.00)
Calcium: 8.1 mg/dL — ABNORMAL LOW (ref 8.9–10.3)
GFR calc Af Amer: >60 mL/min (ref >60)
GFR calc non Af Amer: >60 mL/min (ref >60)
GLUCOSE: 96 mg/dL (ref 70–99)
Potassium: 4.1 mmol/L (ref 3.5–5.1)
Sodium: 141 mmol/L (ref 135–145)

## 2018-05-01 LAB — TROPONIN I
Troponin I: <0.03 ng/mL (ref <0.03)
Troponin I: <0.03 ng/mL (ref <0.03)

## 2018-05-01 LAB — CBC
HCT: 20.6 % — ABNORMAL LOW (ref 36.0–46.0)
Hemoglobin: 6.6 g/dL — CL (ref 12.0–15.0)
MCH: 29.9 pg (ref 26.0–34.0)
MCHC: 32 g/dL (ref 30.0–36.0)
MCV: 93.2 fL (ref 78.0–100.0)
Platelets: 187 10*3/uL (ref 150–400)
RBC: 2.21 MIL/uL — ABNORMAL LOW (ref 3.87–5.11)
RDW: 14.9 % (ref 11.5–15.5)
WBC: 9.9 10*3/uL (ref 4.0–10.5)

## 2018-05-01 NOTE — Progress Notes (Signed)
Carol Ada aware of patient stating she felt like her heart was racing at times. Denies chest pain or SOB.Vital signs showed HR of 120. EKG ordered.

## 2018-05-01 NOTE — Progress Notes (Signed)
Carol Ada aware of EKG results. H&H ordered and troponin STAT. Will continue to monitor.

## 2018-05-01 NOTE — Plan of Care (Signed)

## 2018-05-01 NOTE — Progress Notes (Signed)
Pt ambulating in hallway without difficulty. 

## 2018-05-01 NOTE — Progress Notes (Signed)
2 Days Post-Op   Subjective/Chief Complaint: No overnight events.  HCT down to 20.  No dizziness or lightheadedness.    Objective: Vital signs in last 24 hours: Temp:  [98.2 F (36.8 C)-98.7 F (37.1 C)] 98.2 F (36.8 C) (09/13 0413) Pulse Rate:  [88-93] 88 (09/13 0413) Resp:  [15-18] 15 (09/13 0413) BP: (99-114)/(57-65) 114/58 (09/13 0413) SpO2:  [99 %-100 %] 99 % (09/13 0413) Weight:  [64.5 kg] 64.5 kg (09/13 0413) Last BM Date: 05/01/18  Intake/Output from previous day: 09/12 0701 - 09/13 0700 In: 1932.6 [P.O.:1020; I.V.:912.6] Out: 3025 [Urine:3025] Intake/Output this shift: Total I/O In: 240 [P.O.:240] Out: 1100 [Urine:1100]  General appearance: alert, cooperative and no distress Resp: breathing comfortably GI:  Soft, approp tender, mildly distended.  Dressing c/d/i.    Lab Results:  Recent Labs    04/30/18 0416 05/01/18 0408  WBC 13.9* 9.9  HGB 7.9* 6.6*  HCT 24.0* 20.6*  PLT 207 187   BMET Recent Labs    04/30/18 0416 05/01/18 0408  NA 140 141  K 4.5 4.1  CL 106 108  CO2 28 29  GLUCOSE 185* 96  BUN 7 9  CREATININE 0.58 0.50  CALCIUM 8.5* 8.1*   PT/INR No results for input(s): LABPROT, INR in the last 72 hours. ABG No results for input(s): PHART, HCO3 in the last 72 hours.  Invalid input(s): PCO2, PO2  Studies/Results: No results found.  Anti-infectives: Anti-infectives (From admission, onward)   Start     Dose/Rate Route Frequency Ordered Stop   04/29/18 1600  clindamycin (CLEOCIN) IVPB 900 mg     900 mg 100 mL/hr over 30 Minutes Intravenous Every 8 hours 04/29/18 1439 04/29/18 1634   04/29/18 0645  ciprofloxacin (CIPRO) IVPB 400 mg     400 mg 200 mL/hr over 60 Minutes Intravenous On call to O.R. 04/29/18 0636 04/29/18 0904      Assessment/Plan: s/p Procedure(s): LAPAROSCOPIC RIGHT HEMI-COLECTOMY ERAS PATHWAY (N/A) CYSTOSCOPY WITH  BILATERAL RETROGRADE PYELOGRAM RIGHT STENT PLACEMENT (Bilateral) Right colon/mesenteric mass - ?  Calcified GIST Right hydronephrosis.  REg diet Pain control Await return of bowel function Await pathology.   LOS: 2 days    Erica Ross 05/01/2018

## 2018-05-01 NOTE — Progress Notes (Addendum)
CRITICAL VALUE ALERT  Critical Value:  Hgb 6.6  Date & Time Notied:  05/01/18 0500  Provider Notified: Johney Maine  Orders Received/Actions taken: Order for type & screen. Hold any anticoagulants. Call MD [facility] when she arrives and have her decide whether to transfuse or not.

## 2018-05-02 DIAGNOSIS — K219 Gastro-esophageal reflux disease without esophagitis: Secondary | ICD-10-CM | POA: Diagnosis present

## 2018-05-02 DIAGNOSIS — D62 Acute posthemorrhagic anemia: Secondary | ICD-10-CM | POA: Diagnosis not present

## 2018-05-02 DIAGNOSIS — N132 Hydronephrosis with renal and ureteral calculous obstruction: Secondary | ICD-10-CM | POA: Diagnosis present

## 2018-05-02 DIAGNOSIS — R55 Syncope and collapse: Secondary | ICD-10-CM | POA: Diagnosis not present

## 2018-05-02 DIAGNOSIS — I959 Hypotension, unspecified: Secondary | ICD-10-CM | POA: Diagnosis not present

## 2018-05-02 DIAGNOSIS — R1903 Right lower quadrant abdominal swelling, mass and lump: Secondary | ICD-10-CM | POA: Diagnosis present

## 2018-05-02 LAB — BASIC METABOLIC PANEL
Anion gap: 8 (ref 5–15)
BUN: 9 mg/dL (ref 6–20)
CHLORIDE: 107 mmol/L (ref 98–111)
CO2: 28 mmol/L (ref 22–32)
CREATININE: 0.55 mg/dL (ref 0.44–1.00)
Calcium: 8.5 mg/dL — ABNORMAL LOW (ref 8.9–10.3)
GFR calc Af Amer: >60 mL/min (ref >60)
GFR calc non Af Amer: >60 mL/min (ref >60)
Glucose, Bld: 104 mg/dL — ABNORMAL HIGH (ref 70–99)
POTASSIUM: 4.3 mmol/L (ref 3.5–5.1)
Sodium: 143 mmol/L (ref 135–145)

## 2018-05-02 LAB — CBC
HEMATOCRIT: 20.9 % — AB (ref 36.0–46.0)
Hemoglobin: 6.8 g/dL — CL (ref 12.0–15.0)
MCH: 30.2 pg (ref 26.0–34.0)
MCHC: 32.5 g/dL (ref 30.0–36.0)
MCV: 92.9 fL (ref 78.0–100.0)
PLATELETS: 191 10*3/uL (ref 150–400)
RBC: 2.25 MIL/uL — AB (ref 3.87–5.11)
RDW: 14.9 % (ref 11.5–15.5)
WBC: 9.1 10*3/uL (ref 4.0–10.5)

## 2018-05-02 LAB — TROPONIN I: Troponin I: <0.03 ng/mL (ref <0.03)

## 2018-05-02 LAB — PREPARE RBC (CROSSMATCH)

## 2018-05-02 LAB — HEMOGLOBIN AND HEMATOCRIT, BLOOD
HEMATOCRIT: 22.5 % — AB (ref 36.0–46.0)
HEMOGLOBIN: 7.4 g/dL — AB (ref 12.0–15.0)

## 2018-05-02 LAB — TYPE AND SCREEN
ABO/RH(D): O NEG
Antibody Screen: NEGATIVE

## 2018-05-02 MED ORDER — IOPAMIDOL (ISOVUE-300) INJECTION 61%
INTRAVENOUS | Status: AC
  Filled 2018-05-02: qty 30

## 2018-05-02 MED ORDER — SODIUM CHLORIDE 0.9% IV SOLUTION: Freq: Once | INTRAVENOUS | Status: DC

## 2018-05-02 NOTE — Progress Notes (Signed)
     Assessment & Plan: POD#3 status post right colectomy, stent placement  Tolerating soft diet  Hgb 6.8 this AM, not symptomatic at present  Foley, stent management per urology  Follow Hgb, symptoms  If diet tolerated and no further symptoms from anemia, consider discharge home tomorrow.  Will follow.        Earnstine Regal, MD, [facility]       Office: [phone]   Chief Complaint: Pelvic mass, right colectomy  Subjective: Patient in bed, comfortable.  Tolerating soft diet.  Tachycardia improved.  Denies syncopal symptoms.  Objective: Vital signs in last 24 hours: Temp:  [98.5 F (36.9 C)-99.7 F (37.6 C)] 98.5 F (36.9 C) (09/14 0526) Pulse Rate:  [97-120] 98 (09/14 0526) Resp:  [17-18] 17 (09/14 0526) BP: (102-127)/(60-75) 127/75 (09/14 0526) SpO2:  [100 %] 100 % (09/14 0526) Weight:  [61.3 kg] 61.3 kg (09/14 0554) Last BM Date: 05/01/18  Intake/Output from previous day: 09/13 0701 - 09/14 0700 In: 837 [P.O.:837] Out: 2900 [Urine:2900] Intake/Output this shift: No intake/output data recorded.  Physical Exam: HEENT - sclerae clear, mucous membranes moist Neck - soft Chest - clear bilaterally Cor - RRR Abdomen - soft without distension; BS active; incisions dry and intact Ext - no edema, non-tender Neuro - alert & oriented, no focal deficits  Lab Results:  Recent Labs    05/01/18 0408 05/01/18 1626 05/02/18 0408  WBC 9.9  --  9.1  HGB 6.6* 7.8* 6.8*  HCT 20.6* 24.0* 20.9*  PLT 187  --  191   BMET Recent Labs    05/01/18 0408 05/02/18 0408  NA 141 143  K 4.1 4.3  CL 108 107  CO2 29 28  GLUCOSE 96 104*  BUN 9 9  CREATININE 0.50 0.55  CALCIUM 8.1* 8.5*   PT/INR No results for input(s): LABPROT, INR in the last 72 hours. Comprehensive Metabolic Panel:    Component Value Date/Time   NA 143 05/02/2018 0408   NA 141 05/01/2018 0408   NA 141 02/10/2018 1037   NA 140 03/25/2016 1554   K 4.3 05/02/2018  0408   K 4.1 05/01/2018 0408   CL 107 05/02/2018 0408   CL 108 05/01/2018 0408   CO2 28 05/02/2018 0408   CO2 29 05/01/2018 0408   BUN 9 05/02/2018 0408   BUN 9 05/01/2018 0408   BUN 12 02/10/2018 1037   BUN 12 03/25/2016 1554   CREATININE 0.55 05/02/2018 0408   CREATININE 0.50 05/01/2018 0408   GLUCOSE 104 (H) 05/02/2018 0408   GLUCOSE 96 05/01/2018 0408   CALCIUM 8.5 (L) 05/02/2018 0408   CALCIUM 8.1 (L) 05/01/2018 0408   AST 16 02/10/2018 1037   AST 19 03/25/2016 1554   ALT 14 02/10/2018 1037   ALT 15 03/25/2016 1554   ALKPHOS 68 02/10/2018 1037   ALKPHOS 66 03/25/2016 1554   BILITOT 0.3 02/10/2018 1037   BILITOT <0.2 03/25/2016 1554   PROT 7.6 02/10/2018 1037   PROT 7.3 03/25/2016 1554   ALBUMIN 4.4 02/10/2018 1037   ALBUMIN 4.3 03/25/2016 1554    Studies/Results: No results found.    Bhakti Labella M 05/02/2018  Patient ID: Erica Ross, female   DOB: [birth year], 42 y.o.   MRN: [id number]

## 2018-05-02 NOTE — Progress Notes (Signed)
Spoke with Dr. Harlow Asa, order to Hold blood transfuion at this time pending Hbg at 1600. Pt in agreement with this.

## 2018-05-03 LAB — CBC
HEMATOCRIT: 22.5 % — AB (ref 36.0–46.0)
Hemoglobin: 7.4 g/dL — ABNORMAL LOW (ref 12.0–15.0)
MCH: 30.5 pg (ref 26.0–34.0)
MCHC: 32.9 g/dL (ref 30.0–36.0)
MCV: 92.6 fL (ref 78.0–100.0)
PLATELETS: 259 10*3/uL (ref 150–400)
RBC: 2.43 MIL/uL — ABNORMAL LOW (ref 3.87–5.11)
RDW: 14.7 % (ref 11.5–15.5)
WBC: 8.6 10*3/uL (ref 4.0–10.5)

## 2018-05-03 LAB — BASIC METABOLIC PANEL
ANION GAP: 9 (ref 5–15)
BUN: 15 mg/dL (ref 6–20)
CO2: 27 mmol/L (ref 22–32)
Calcium: 8.8 mg/dL — ABNORMAL LOW (ref 8.9–10.3)
Chloride: 106 mmol/L (ref 98–111)
Creatinine, Ser: 0.5 mg/dL (ref 0.44–1.00)
GFR calc non Af Amer: >60 mL/min (ref >60)
Glucose, Bld: 128 mg/dL — ABNORMAL HIGH (ref 70–99)
POTASSIUM: 3.6 mmol/L (ref 3.5–5.1)
Sodium: 142 mmol/L (ref 135–145)

## 2018-05-03 MED ORDER — TRAMADOL HCL 50 MG PO TABS: 50.0000 mg | ORAL_TABLET | Freq: Four times a day (QID) | ORAL | 0 refills | Status: DC | PRN

## 2018-05-03 NOTE — Discharge Instructions (Signed)
DISCHARGE INSTRUCTIONS FOR KIDNEY STONE/URETERAL STENT   MEDICATIONS:  1.  Resume all your other meds from home - except do not take any extra narcotic pain meds that you may have at home.    ACTIVITY:  1. No strenuous activity x 1week  2. No driving while on narcotic pain medications  3. Drink plenty of water  4. Continue to walk at home - you can still get blood clots when you are at home, so keep active, but don't over do it.  5. May return to work/school tomorrow or when you feel ready    [facility]  LAPAROSCOPIC SURGERY:  POST-OP INSTRUCTIONS  Always review your discharge instruction sheet given to you by the facility where your surgery was performed.  A prescription for pain medication may be given to you upon discharge.  Take your pain medication as prescribed.  If narcotic pain medicine is not needed, then you may take acetaminophen (Tylenol) or ibuprofen (Advil) as needed.  Take your usually prescribed medications unless otherwise directed.  If you need a refill on your pain medication, please contact your pharmacy.  They will contact our office to request authorization. Prescriptions will not be filled after 5 P.M. or on weekends.  You should follow a light diet the first few days after arrival home, such as soup and crackers or toast.  Be sure to include plenty of fluids daily.  Most patients will experience some swelling and bruising in the area of the incisions.  Ice packs will help.  Swelling and bruising can take several days to resolve.   It is common to experience some constipation after surgery.  Increasing fluid intake and taking a stool softener (such as Colace) will usually help or prevent this problem from occurring.  A mild laxative (Milk of Magnesia or Miralax) should be taken according to package instructions if there has been no bowel movement after 48 hours.  You will have steri-strips and a gauze dressing over your incisions.  You may  remove the gauze bandage on the second day after surgery, and you may shower at that time.  Leave your steri-strips (small skin tapes) in place directly over the incision.  These strips should remain on the skin for 5-7 days and then be removed.  You may get them wet in the shower and pat them dry.  Any sutures or staples will be removed at the office during your follow-up visit.  ACTIVITIES:  You may resume regular (light) daily activities beginning the next day - such as daily self-care, walking, climbing stairs - gradually increasing activities as tolerated.  You may have sexual intercourse when it is comfortable.  Refrain from any heavy lifting or straining until approved by your doctor.  You may drive when you are no longer taking prescription pain medication, you can comfortably wear a seatbelt, and you can safely maneuver your car and apply brakes.  You should see your doctor in the office for a follow-up appointment approximately 2-3 weeks after your surgery.  Make sure that you call for this appointment within a day or two after you arrive home to insure a convenient appointment time.  WHEN TO CALL YOUR DOCTOR: 1. Fever over 101.0 2. Inability to urinate 3. Continued bleeding from incision 4. Increased pain, redness, or drainage from the incision 5. Increasing abdominal pain  The clinic staff is available to answer your questions during regular business hours.  Please dont hesitate to call and ask to speak to  one of the nurses for clinical concerns.  If you have a medical emergency, go to the nearest emergency room or call 911.  A surgeon from [facility] is always on call for the hospital.  Earnstine Regal, MD, [facility] Office: [employer] Free:  [place] [phone]  Website: [email or web address]  BATHING:  1. You can shower and we recommend daily showers   SIGNS/SYMPTOMS TO CALL:  Please call us if you  have a fever greater than 101.5, uncontrolled nausea/vomiting, uncontrolled pain, dizziness, unable to urinate, bloody urine, chest pain, shortness of breath, leg swelling, leg pain, redness around wound, drainage from wound, or any other concerns or questions.   You can reach [place] at [phone].   FOLLOW-UP:  1. You have an 2 weeks for stent removal.

## 2018-05-03 NOTE — Progress Notes (Signed)
Assessment & Plan: POD#4 - status post right colectomy, stent placement             Tolerating soft diet             Hgb 7.4 this AM, stable to rising past 24 hours             Foley, stent management per urology  Doing well.  No further symptoms from anemia.  Tolerating diet without complaints.  Anxious to go home.  Discharge home today.  Follow up with Dr. Barry Dienes in 2 weeks.        Earnstine Regal, MD, [facility]       Office: [phone]   Chief Complaint: Colon/pelvic mass  Subjective: Patient doing well this AM.  Family at bedside.  No complaints.  Objective: Vital signs in last 24 hours: Temp:  [98.4 F (36.9 C)-99.5 F (37.5 C)] 98.4 F (36.9 C) (09/15 0536) Pulse Rate:  [83-90] 90 (09/15 0536) Resp:  [18] 18 (09/15 0536) BP: (105-118)/(65-73) 118/66 (09/15 0536) SpO2:  [100 %] 100 % (09/15 0536) Last BM Date: 05/02/18  Intake/Output from previous day: 09/14 0701 - 09/15 0700 In: 1320 [P.O.:1320] Out: 1200 [Urine:1200] Intake/Output this shift: No intake/output data recorded.  Physical Exam: HEENT - sclerae clear, mucous membranes moist Neck - soft Chest - clear bilaterally Cor - RRR Abdomen - soft without distension; dressing removed; wounds dry and intact Ext - no edema, non-tender Neuro - alert & oriented, no focal deficits  Lab Results:  Recent Labs    05/02/18 0408 05/02/18 1118 05/03/18 0356  WBC 9.1  --  8.6  HGB 6.8* 7.4* 7.4*  HCT 20.9* 22.5* 22.5*  PLT 191  --  259   BMET Recent Labs    05/02/18 0408 05/03/18 0356  NA 143 142  K 4.3 3.6  CL 107 106  CO2 28 27  GLUCOSE 104* 128*  BUN 9 15  CREATININE 0.55 0.50  CALCIUM 8.5* 8.8*   PT/INR No results for input(s): LABPROT, INR in the last 72 hours. Comprehensive Metabolic Panel:    Component Value Date/Time   NA 142 05/03/2018 0356   NA 143 05/02/2018 0408   NA 141 02/10/2018 1037   NA 140 03/25/2016 1554   K 3.6 05/03/2018 0356   K 4.3 05/02/2018 0408   CL 106 05/03/2018 0356   CL 107 05/02/2018 0408   CO2 27 05/03/2018 0356   CO2 28 05/02/2018 0408   BUN 15 05/03/2018 0356   BUN 9 05/02/2018 0408   BUN 12 02/10/2018 1037   BUN 12 03/25/2016 1554   CREATININE 0.50 05/03/2018 0356   CREATININE 0.55 05/02/2018 0408   GLUCOSE 128 (H) 05/03/2018 0356   GLUCOSE 104 (H) 05/02/2018 0408   CALCIUM 8.8 (L) 05/03/2018 0356   CALCIUM 8.5 (L) 05/02/2018 0408   AST 16 02/10/2018 1037   AST 19 03/25/2016 1554   ALT 14 02/10/2018 1037   ALT 15 03/25/2016 1554   ALKPHOS 68 02/10/2018 1037   ALKPHOS 66 03/25/2016 1554   BILITOT 0.3 02/10/2018 1037   BILITOT <0.2 03/25/2016 1554   PROT 7.6 02/10/2018 1037   PROT 7.3 03/25/2016 1554   ALBUMIN 4.4 02/10/2018 1037   ALBUMIN 4.3 03/25/2016 1554    Studies/Results: No results found.    Erica Ross 05/03/2018  Patient ID: Erica Ross, female   DOB: 12/01/1975, 42 y.o.  MRN: [id number]

## 2018-05-05 NOTE — Discharge Summary (Signed)
Physician Discharge Summary  Patient ID: Erica Ross MRN: [id number] DOB/AGE: October 25, 1975 42 y.o.  Admit date: 04/29/2018 Discharge date: 05/03/2018  Admission Diagnoses: Patient Active Problem List   Diagnosis Date Noted  . 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 Adnexal mass 03/17/2018  . Hydronephrosis 03/17/2018     Discharge Diagnoses:  Active Problems:   Calcified mesenteric mass Acute blood loss anemia Right ureteral stent for hydronephrosis  Discharged Condition: stable  Hospital Course:  Pt was admitted to the floor following a diagnostic laparoscopy and lap right hemicolectomy for a pelvic mass.  It was unclear preoperatively what the organ of origin was, so Dr. Gerarda Fraction and I explored together.  It became quickly apparent that the mass was in the wall or the mesentery of the cecum very close to the ileocecal valve.   She had an episode of syncope and hypotension on POD 0-1 which responded to fluid.  Her HCT at that point was 37.9 down from 40.  The next day her HCT did drop to 24, but her BP was stable, mental status was good, she denied dizziness, and she had good urine output.   She was able to ambulate and her diet was advanced.  She had bowel function.  Her HCT stabilized at 22 and she was discharged to home in stable condition.  She did not require transfusion.   She transitioned to oral pain medication.  She was ambulatory.   Consults: None  Significant Diagnostic Studies: labs: see above  Treatments: surgery: see above  Discharge Exam: Blood pressure 118/66, pulse 90, temperature 98.4 F (36.9 C), temperature source Oral, resp. rate 18, height 5' 3.25" (1.607 m), weight 61.3 kg, SpO2 100 %. General appearance: alert, cooperative and no distress Resp: breathing comfortably GI: soft, non distended, approp tender Skin: sl pale.    Disposition:   Discharge Instructions    Diet - low sodium heart healthy   Complete by:  As directed    Increase activity  slowly   Complete by:  As directed    No wound care   Complete by:  As directed      Allergies as of 05/03/2018      Reactions   Watermelon [citrullus Vulgaris] Itching   Throat itches.   Amoxicillin Rash   Has patient had a PCN reaction causing immediate rash, facial/tongue/throat swelling, SOB or lightheadedness with hypotension: No Has patient had a PCN reaction causing severe rash involving mucus membranes or skin necrosis: No Has patient had a PCN reaction that required hospitalization: No Has patient had a PCN reaction occurring within the last 10 years: Yes--very mild rash If all of the above answers are "NO", then may proceed with Cephalosporin use.      Medication List    TAKE these medications   cetirizine HCl 5 MG/5ML Soln Commonly known as:  Zyrtec Take 5 mg by mouth daily as needed for allergies.   Diindolylmethane Powd Take 1 capsule by mouth daily. (DIM)   EVENING PRIMROSE OIL PO Take 1,200 mg by mouth daily.   FISH OIL OMEGA-3 PO Take 2 capsules by mouth daily.   nitrofurantoin (macrocrystal-monohydrate) 100 MG capsule Commonly known as:  MACROBID Take 1 capsule (100 mg total) by mouth 2 (two) times daily. 1 po BId   PROBIOTIC PO Take 1 capsule by mouth daily.   RESVERATROL PO Take 1 capsule by mouth daily.   SODIUM BICARBONATE PO Take 2 capsules by mouth at bedtime.   SUPER GREENS Powd Take  1 Scoop by mouth daily.   traMADol 50 MG tablet Commonly known as:  ULTRAM Take 1-2 tablets (50-100 mg total) by mouth every 6 (six) hours as needed.   vitamin C 1000 MG tablet Take 1,000 mg by mouth daily. w/Elderberry   Vitamin D-3 5000 units Tabs Take 1,500 Units by mouth daily.      Follow-up Information    Ardis Hughs, MD In 2 weeks.   Specialty:  Urology Why:  Stent removal Contact information: [place] [address] [phone]        Stark Klein, MD. Schedule an appointment as soon as possible for a visit in 2  week(s).   Specialty:  General Surgery Contact information: [address] [place] [address] [phone]           Signed: Stark Klein 05/05/2018, 12:42 PM

## 2018-05-06 ENCOUNTER — Emergency Department (HOSPITAL_COMMUNITY): Payer: BLUE CROSS/BLUE SHIELD

## 2018-05-06 ENCOUNTER — Inpatient Hospital Stay (HOSPITAL_COMMUNITY)
Admission: EM | Admit: 2018-05-06 | Discharge: 2018-05-19 | DRG: 862 | Disposition: A | Discharge status: Home / Self Care | Payer: BLUE CROSS/BLUE SHIELD | Attending: General Surgery | Admitting: General Surgery

## 2018-05-06 ENCOUNTER — Other Ambulatory Visit: Payer: Self-pay

## 2018-05-06 ENCOUNTER — Encounter (HOSPITAL_COMMUNITY): Payer: Self-pay | Admitting: Obstetrics and Gynecology

## 2018-05-06 DIAGNOSIS — T8143XA Infection following a procedure, organ and space surgical site, initial encounter: Secondary | ICD-10-CM

## 2018-05-06 DIAGNOSIS — K651 Peritoneal abscess: Secondary | ICD-10-CM | POA: Diagnosis present

## 2018-05-06 DIAGNOSIS — B998 Other infectious disease: Secondary | ICD-10-CM | POA: Diagnosis present

## 2018-05-06 DIAGNOSIS — Z9049 Acquired absence of other specified parts of digestive tract: Secondary | ICD-10-CM

## 2018-05-06 DIAGNOSIS — R1900 Intra-abdominal and pelvic swelling, mass and lump, unspecified site: Secondary | ICD-10-CM | POA: Diagnosis not present

## 2018-05-06 DIAGNOSIS — Z91018 Allergy to other foods: Secondary | ICD-10-CM | POA: Diagnosis not present

## 2018-05-06 DIAGNOSIS — N1330 Unspecified hydronephrosis: Secondary | ICD-10-CM | POA: Diagnosis present

## 2018-05-06 DIAGNOSIS — R300 Dysuria: Secondary | ICD-10-CM | POA: Diagnosis not present

## 2018-05-06 DIAGNOSIS — Z87442 Personal history of urinary calculi: Secondary | ICD-10-CM | POA: Diagnosis not present

## 2018-05-06 DIAGNOSIS — K6811 Postprocedural retroperitoneal abscess: Secondary | ICD-10-CM | POA: Diagnosis not present

## 2018-05-06 DIAGNOSIS — Y838 Other surgical procedures as the cause of abnormal reaction of the patient, or of later complication, without mention of misadventure at the time of the procedure: Secondary | ICD-10-CM | POA: Diagnosis not present

## 2018-05-06 DIAGNOSIS — K219 Gastro-esophageal reflux disease without esophagitis: Secondary | ICD-10-CM | POA: Diagnosis present

## 2018-05-06 DIAGNOSIS — Z79899 Other long term (current) drug therapy: Secondary | ICD-10-CM | POA: Diagnosis not present

## 2018-05-06 DIAGNOSIS — Z466 Encounter for fitting and adjustment of urinary device: Secondary | ICD-10-CM | POA: Diagnosis not present

## 2018-05-06 DIAGNOSIS — T8140XA Infection following a procedure, unspecified, initial encounter: Secondary | ICD-10-CM | POA: Diagnosis present

## 2018-05-06 DIAGNOSIS — L0291 Cutaneous abscess, unspecified: Secondary | ICD-10-CM

## 2018-05-06 DIAGNOSIS — Z888 Allergy status to other drugs, medicaments and biological substances status: Secondary | ICD-10-CM | POA: Diagnosis not present

## 2018-05-06 DIAGNOSIS — K8020 Calculus of gallbladder without cholecystitis without obstruction: Secondary | ICD-10-CM | POA: Diagnosis not present

## 2018-05-06 DIAGNOSIS — R000 Tachycardia, unspecified: Secondary | ICD-10-CM | POA: Diagnosis not present

## 2018-05-06 DIAGNOSIS — T8149XA Infection following a procedure, other surgical site, initial encounter: Secondary | ICD-10-CM

## 2018-05-06 DIAGNOSIS — D649 Anemia, unspecified: Secondary | ICD-10-CM | POA: Diagnosis not present

## 2018-05-06 LAB — CBC WITH DIFFERENTIAL/PLATELET
Basophils Absolute: 0 10*3/uL (ref 0.0–0.1)
Basophils Relative: 0 %
EOS ABS: 0 10*3/uL (ref 0.0–0.7)
EOS PCT: 0 %
HCT: 27.5 % — ABNORMAL LOW (ref 36.0–46.0)
Hemoglobin: 9.5 g/dL — ABNORMAL LOW (ref 12.0–15.0)
LYMPHS PCT: 4 %
Lymphs Abs: 0.8 10*3/uL (ref 0.7–4.0)
MCH: 30 pg (ref 26.0–34.0)
MCHC: 34.5 g/dL (ref 30.0–36.0)
MCV: 86.8 fL (ref 78.0–100.0)
Monocytes Absolute: 2.1 10*3/uL — ABNORMAL HIGH (ref 0.1–1.0)
Monocytes Relative: 9 %
NEUTROS ABS: 19.8 10*3/uL — AB (ref 1.7–7.7)
Neutrophils Relative %: 87 %
PLATELETS: 728 10*3/uL — AB (ref 150–400)
RBC: 3.17 MIL/uL — AB (ref 3.87–5.11)
RDW: 13.8 % (ref 11.5–15.5)
WBC: 22.7 10*3/uL — AB (ref 4.0–10.5)

## 2018-05-06 LAB — I-STAT CHEM 8, ED
BUN: 22 mg/dL — AB (ref 6–20)
CALCIUM ION: 1.11 mmol/L — AB (ref 1.15–1.40)
CREATININE: 0.7 mg/dL (ref 0.44–1.00)
Chloride: 88 mmol/L — ABNORMAL LOW (ref 98–111)
GLUCOSE: 171 mg/dL — AB (ref 70–99)
HCT: 29 % — ABNORMAL LOW (ref 36.0–46.0)
HEMOGLOBIN: 9.9 g/dL — AB (ref 12.0–15.0)
Potassium: 4.2 mmol/L (ref 3.5–5.1)
Sodium: 126 mmol/L — ABNORMAL LOW (ref 135–145)
TCO2: 29 mmol/L (ref 22–32)

## 2018-05-06 LAB — URINALYSIS, ROUTINE W REFLEX MICROSCOPIC
Bilirubin Urine: NEGATIVE
Glucose, UA: NEGATIVE mg/dL
Ketones, ur: 5 mg/dL — AB
Nitrite: NEGATIVE
Protein, ur: 30 mg/dL — AB
SPECIFIC GRAVITY, URINE: 1.026 (ref 1.005–1.030)
pH: 6 (ref 5.0–8.0)

## 2018-05-06 LAB — I-STAT BETA HCG BLOOD, ED (MC, WL, AP ONLY): HCG, QUANTITATIVE: 8.6 m[IU]/mL — AB (ref <5)

## 2018-05-06 LAB — TYPE AND SCREEN
ABO/RH(D): O NEG
ABO/RH(D): O NEG
ANTIBODY SCREEN: NEGATIVE
Antibody Screen: NEGATIVE
UNIT DIVISION: 0

## 2018-05-06 LAB — BPAM RBC
Blood Product Expiration Date: 201910132359
Unit Type and Rh: 9500

## 2018-05-06 LAB — COMPREHENSIVE METABOLIC PANEL
ALT: 18 U/L (ref 0–44)
ANION GAP: 16 — AB (ref 5–15)
AST: 26 U/L (ref 15–41)
Albumin: 2.5 g/dL — ABNORMAL LOW (ref 3.5–5.0)
Alkaline Phosphatase: 76 U/L (ref 38–126)
BUN: 22 mg/dL — ABNORMAL HIGH (ref 6–20)
CO2: 25 mmol/L (ref 22–32)
Calcium: 9.4 mg/dL (ref 8.9–10.3)
Chloride: 91 mmol/L — ABNORMAL LOW (ref 98–111)
Creatinine, Ser: 0.64 mg/dL (ref 0.44–1.00)
GFR calc Af Amer: >60 mL/min (ref >60)
Glucose, Bld: 167 mg/dL — ABNORMAL HIGH (ref 70–99)
Potassium: 4.3 mmol/L (ref 3.5–5.1)
Sodium: 132 mmol/L — ABNORMAL LOW (ref 135–145)
TOTAL PROTEIN: 6.7 g/dL (ref 6.5–8.1)
Total Bilirubin: 1 mg/dL (ref 0.3–1.2)

## 2018-05-06 LAB — I-STAT CG4 LACTIC ACID, ED: Lactic Acid, Venous: 1.58 mmol/L (ref 0.5–1.9)

## 2018-05-06 LAB — I-STAT TROPONIN, ED: Troponin i, poc: 0 ng/mL (ref 0.00–0.08)

## 2018-05-06 MED ORDER — FENTANYL CITRATE (PF) 100 MCG/2ML IJ SOLN
50.0000 ug | Freq: Once | INTRAMUSCULAR | Status: AC
  Administered 2018-05-06: 50 ug via INTRAVENOUS
  Filled 2018-05-06: qty 2

## 2018-05-06 MED ORDER — METRONIDAZOLE IN NACL 5-0.79 MG/ML-% IV SOLN
500.0000 mg | Freq: Once | INTRAVENOUS | Status: AC
  Administered 2018-05-06: 500 mg via INTRAVENOUS
  Filled 2018-05-06: qty 100

## 2018-05-06 MED ORDER — IOPAMIDOL (ISOVUE-370) INJECTION 76%
100.0000 mL | Freq: Once | INTRAVENOUS | Status: AC | PRN
  Administered 2018-05-06: 100 mL via INTRAVENOUS

## 2018-05-06 MED ORDER — IOPAMIDOL (ISOVUE-370) INJECTION 76%
INTRAVENOUS | Status: AC
  Filled 2018-05-06: qty 100

## 2018-05-06 MED ORDER — SODIUM CHLORIDE 0.9 % IV BOLUS
1000.0000 mL | Freq: Once | INTRAVENOUS | Status: AC
  Administered 2018-05-06: 1000 mL via INTRAVENOUS

## 2018-05-06 MED ORDER — SODIUM CHLORIDE 0.9 % IV SOLN
2.0000 g | Freq: Once | INTRAVENOUS | Status: AC
  Administered 2018-05-06: 2 g via INTRAVENOUS
  Filled 2018-05-06: qty 2

## 2018-05-06 NOTE — ED Triage Notes (Signed)
Pt reports she is 1 week post op and is concerned about her heart rate. Pt recently had laparoscopic surgery for hernia repair. Pt reports lot of gas and discomfort. Pt reports her hemoglobin was low before she went home and is also concerned about that.

## 2018-05-06 NOTE — ED Notes (Signed)
ED Provider at bedside. 

## 2018-05-06 NOTE — ED Provider Notes (Signed)
[facility] DEPT Provider Note   CSN: [id number] Arrival date & time: 05/06/18  2009     History   Chief Complaint Chief Complaint  Patient presents with  . Post-op Problem    HPI Erica Ross is a 42 y.o. female.  The history is provided by the patient. No language interpreter was used.   Erica Ross is a 42 y.o. female who presents to the Emergency Department complaining of tachycardia. She presents to the emergency department for evaluation of tachycardia and weakness. She is one week postoperative following laparoscopic surgery to remove abdominal mass. The surgery was complicated with hydronephrosis and she had a ureteral stent placed. She did have anemia following the surgery, but did not required transfusion. She reports feeling bloating and gas in her abdomen but no significant pain. She does feel weak and try to be more active today. Her last BM was yesterday and but she is passing less gas. She does endorse dysuria and a feeling of pressure when she goes to urinate. She had a temperature to 101 today. She did not check her temperature prior to today but she has been feeling sweaty at times. She denies any chest pain, cough, shortness of breath, nausea, vomiting. Symptoms are moderate and constant in nature. Past Medical History:  Diagnosis Date  . Adnexal mass 03/02/2018   Right 5.1cm  . Gallstones   . GERD (gastroesophageal reflux disease)    occ   . Hay fever   . Hernia, umbilical   . Kidney stones   . Left breast mass   . Renal calculus, right   . UTI (urinary tract infection)     Patient Active Problem List   Diagnosis Date Noted  . Post-operative infection 05/07/2018  . 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 Adnexal mass 03/17/2018  . Hydronephrosis 03/17/2018    Past Surgical History:  Procedure Laterality Date  . CYSTOSCOPY WITH STENT PLACEMENT Bilateral 04/29/2018   Procedure: CYSTOSCOPY WITH  BILATERAL RETROGRADE  PYELOGRAM RIGHT STENT PLACEMENT;  Surgeon: Ardis Hughs, MD;  Location: [facility];  Service: Urology;  Laterality: Bilateral;  . LAPAROSCOPIC REMOVAL ABDOMINAL MASS N/A 04/29/2018   Procedure: LAPAROSCOPIC RIGHT HEMI-COLECTOMY ERAS PATHWAY;  Surgeon: Stark Klein, MD;  Location: [facility];  Service: General;  Laterality: N/A;     OB History   None      Home Medications    Prior to Admission medications   Medication Sig Start Date End Date Taking? Authorizing Provider  acetaminophen (TYLENOL) 500 MG tablet Take 500-1,000 mg by mouth daily as needed for moderate pain.   Yes [provider]  Ascorbic Acid (VITAMIN C) 1000 MG tablet Take 1,000 mg by mouth daily. w/Elderberry   Yes [provider]  Cholecalciferol (VITAMIN D-3) 5000 units TABS Take 1,500 Units by mouth daily.    Yes [provider]  IRON PO Take 1 tablet by mouth daily.   Yes [provider]  Misc Natural Products (SUPER GREENS) POWD Take 1 Scoop by mouth daily.   Yes [provider]  Probiotic Product (PROBIOTIC PO) Take 1 capsule by mouth daily.   Yes [provider]  traMADol (ULTRAM) 50 MG tablet Take 1-2 tablets (50-100 mg total) by mouth every 6 (six) hours as needed. 05/03/18  Yes Armandina Gemma, MD  Diindolylmethane POWD Take 1 capsule by mouth daily. (DIM)    [provider]  EVENING PRIMROSE OIL PO Take 1,200 mg by mouth daily.  [provider]  nitrofurantoin, macrocrystal-monohydrate, (MACROBID) 100 MG capsule Take 1 capsule (100 mg total) by mouth 2 (two) times daily. 1 po BId Patient not taking: Reported on 05/06/2018 03/21/18   Dettinger, Fransisca Kaufmann, MD  Omega-3 Fatty Acids (FISH OIL OMEGA-3 PO) Take 2 capsules by mouth daily.     [provider]  RESVERATROL PO Take 1 capsule by mouth daily.     [provider]  SODIUM BICARBONATE PO Take 2 capsules by mouth at bedtime.    [provider]    Family History Family  History  Problem Relation Age of Onset  . Diabetes Mother   . Aneurysm Father   . Diabetes Brother   . Breast cancer Paternal Grandmother   . Breast cancer Other     Social History Social History   Tobacco Use  . Smoking status: Never Smoker  . Smokeless tobacco: Never Used  Substance Use Topics  . Alcohol use: No    Alcohol/week: 0.0 standard drinks  . Drug use: No     Allergies   Watermelon [citrullus vulgaris] and Amoxicillin   Review of Systems Review of Systems  All other systems reviewed and are negative.    Physical Exam Updated Vital Signs BP 121/83 (BP Location: Left Arm)   Pulse (!) 128   Temp (!) 100.5 F (38.1 C) (Rectal) Comment: EDP Reese notified  Resp 18   Ht 5\' 4"  (1.626 m)   Wt 59 kg   LMP 04/29/2018   SpO2 97%   BMI 22.31 kg/m   Physical Exam  Constitutional: She is oriented to person, place, and time. She appears well-developed and well-nourished.  HENT:  Head: Normocephalic and atraumatic.  Cardiovascular: Regular rhythm.  No murmur heard. Tachycardic  Pulmonary/Chest: Effort normal and breath sounds normal. No respiratory distress.  Abdominal:  Healing midline surgical incision. There is mild abdominal distention with mild to moderate left-sided abdominal tenderness. No guarding or rebound.  Musculoskeletal: She exhibits no edema or tenderness.  Neurological: She is alert and oriented to person, place, and time.  Skin: Skin is warm and dry. There is pallor.  Psychiatric: She has a normal mood and affect. Her behavior is normal.  Nursing note and vitals reviewed.    ED Treatments / Results  Labs (all labs ordered are listed, but only abnormal results are displayed) Labs Reviewed  COMPREHENSIVE METABOLIC PANEL - Abnormal; Notable for the following components:      Result Value   Sodium 132 (*)    Chloride 91 (*)    Glucose, Bld 167 (*)    BUN 22 (*)    Albumin 2.5 (*)    Anion gap 16 (*)    All other components within  normal limits  CBC WITH DIFFERENTIAL/PLATELET - Abnormal; Notable for the following components:   WBC 22.7 (*)    RBC 3.17 (*)    Hemoglobin 9.5 (*)    HCT 27.5 (*)    Platelets 728 (*)    Neutro Abs 19.8 (*)    Monocytes Absolute 2.1 (*)    All other components within normal limits  URINALYSIS, ROUTINE W REFLEX MICROSCOPIC - Abnormal; Notable for the following components:   Hgb urine dipstick MODERATE (*)    Ketones, ur 5 (*)    Protein, ur 30 (*)    Leukocytes, UA TRACE (*)    Bacteria, UA RARE (*)    All other components within normal limits  I-STAT BETA HCG BLOOD, ED ([facility], [facility], [facility]  ONLY) - Abnormal; Notable for the following components:   I-stat hCG, quantitative 8.6 (*)    All other components within normal limits  I-STAT CHEM 8, ED - Abnormal; Notable for the following components:   Sodium 126 (*)    Chloride 88 (*)    BUN 22 (*)    Glucose, Bld 171 (*)    Calcium, Ion 1.11 (*)    Hemoglobin 9.9 (*)    HCT 29.0 (*)    All other components within normal limits  CULTURE, BLOOD (ROUTINE X 2)  CULTURE, BLOOD (ROUTINE X 2)  URINE CULTURE  I-STAT TROPONIN, ED  I-STAT TROPONIN, ED  I-STAT CG4 LACTIC ACID, ED  TYPE AND SCREEN    EKG EKG Interpretation  Date/Time:  Wednesday May 06 2018 20:32:42 EDT Ventricular Rate:  143 PR Interval:    QRS Duration: 79 QT Interval:  299 QTC Calculation: 462 R Axis:   49 Text Interpretation:  Sinus tachycardia Atrial premature complex Borderline T wave abnormalities Confirmed by Quintella Reichert [phone]) on 05/06/2018 8:51:59 PM   Radiology Ct Angio Chest Pe W/cm &/or Wo Cm  Result Date: 05/06/2018 CLINICAL DATA:  One week postop from laparoscopic hernia repair. Lots of gas and discomfort. Bloating, tachycardia, and fever. Concern about heart rate. EXAM: CT ANGIOGRAPHY CHEST CT ABDOMEN AND PELVIS WITH CONTRAST TECHNIQUE: Multidetector CT imaging of the chest was performed using the standard protocol during bolus administration of  intravenous contrast. Multiplanar CT image reconstructions and MIPs were obtained to evaluate the vascular anatomy. Multidetector CT imaging of the abdomen and pelvis was performed using the standard protocol during bolus administration of intravenous contrast. CONTRAST:  153mL ISOVUE-370 IOPAMIDOL (ISOVUE-370) INJECTION 76% COMPARISON:  CT abdomen and pelvis 02/28/2018 FINDINGS: CTA CHEST FINDINGS Cardiovascular: Examination is somewhat limited due to motion artifact. There is good opacification of the central and segmental pulmonary arteries. No focal filling defects demonstrated. No evidence of significant pulmonary embolus. Normal caliber thoracic aorta. No aortic dissection. Great vessel origins are patent. Normal heart size. No pericardial effusions. Mediastinum/Nodes: Esophagus is decompressed. Probable small esophageal hiatal hernia. No significant lymphadenopathy in the chest. Lungs/Pleura: Evaluation is limited by motion artifact. No focal consolidation and no airspace disease identified. No pleural effusions. No pneumothorax. Musculoskeletal: No chest wall abnormality. No acute or significant osseous findings. Review of the MIP images confirms the above findings. CT ABDOMEN and PELVIS FINDINGS Hepatobiliary: Large stones in the gallbladder. No wall thickening or inflammatory changes. No bile duct dilatation. No focal liver lesions. Pancreas: Unremarkable. No pancreatic ductal dilatation or surrounding inflammatory changes. Spleen: Normal in size without focal abnormality. Adrenals/Urinary Tract: No adrenal gland nodules. Renal nephrograms are homogeneous and normal. Right ureteral stent with proximal pigtail in the right renal pelvis and distal pigtail in the bladder. There is some residual hydronephrosis and hydroureter which may indicate poor stent function. Left kidney and ureter are unremarkable. Small amount of gas in the bladder without bladder wall thickening. This may be due to previous  instrumentation. Stomach/Bowel: Gas and fluid in the stomach without abnormal distention or wall thickening. Dilated fluid-filled small bowel with distal decompression. Appearance suspicious for small bowel obstruction. Ileus less likely due to change in caliber. Scattered stool in the colon without significant colonic distention. Anastomosis in the right colon. No definite bowel wall or colonic wall thickening. Vascular/Lymphatic: No significant vascular findings are present. No enlarged abdominal or pelvic lymph nodes. Reproductive: Uterus and ovaries are not enlarged. Other: Since the previous study, there has been interval  removal of the partially calcified soft tissue mass seen previously in the right lower quadrant. There is interval presence of diffuse free intra-abdominal air. This is likely residual postoperative gas but follow-up is suggested to exclude a leak. Several loculated fluid collections are demonstrated in the right lower quadrant and pelvis. Largest is in the posterior pelvis posterior to the uterus and measuring 9.9 x 10.2 cm. Fluid collection anterior to the uterus measures up to about 8.1 cm diameter. Less well defined fluid collection in the right lower quadrant posterior to the cecum in anastomosis measuring about 6.4 cm in diameter. Fluid collections have increased density measurements. There is a small amount of diffuse free fluid throughout the abdomen as well. While these could represent postoperative fluid collections, the appearance is worrisome for multiple abscesses. Seromas or hematomas less likely. Musculoskeletal: No acute or significant osseous findings. Review of the MIP images confirms the above findings. IMPRESSION: CTA CHEST: No evidence of significant pulmonary embolus. No evidence of active pulmonary disease. CT ABDOMEN AND PELVIS: 1. Multiple loculated fluid collections throughout the abdomen and pelvis with well-defined low-density components. These are worrisome for  postoperative abscesses. Hematoma/seroma less likely. 2. Diffuse free intraperitoneal air and free fluid, likely postoperative. Consider follow-up to exclude bowel leak. 3. Fluid-filled dilated small bowel loops with distal decompression likely indicating small bowel obstruction. Ileus less likely. No abnormal bowel wall thickening or pneumatosis. 4. Right ureteral stent with proximal pigtail in the right renal pelvis and distal pigtail in the bladder. Residual hydronephrosis and hydroureter may indicate poor stent function. Small amount of gas in the bladder may be due to previous instrumentation. Electronically Signed   By: Lucienne Capers M.D.   On: 05/06/2018 23:49   Ct Abdomen Pelvis W Contrast  Result Date: 05/06/2018 CLINICAL DATA:  One week postop from laparoscopic hernia repair. Lots of gas and discomfort. Bloating, tachycardia, and fever. Concern about heart rate. EXAM: CT ANGIOGRAPHY CHEST CT ABDOMEN AND PELVIS WITH CONTRAST TECHNIQUE: Multidetector CT imaging of the chest was performed using the standard protocol during bolus administration of intravenous contrast. Multiplanar CT image reconstructions and MIPs were obtained to evaluate the vascular anatomy. Multidetector CT imaging of the abdomen and pelvis was performed using the standard protocol during bolus administration of intravenous contrast. CONTRAST:  191mL ISOVUE-370 IOPAMIDOL (ISOVUE-370) INJECTION 76% COMPARISON:  CT abdomen and pelvis 02/28/2018 FINDINGS: CTA CHEST FINDINGS Cardiovascular: Examination is somewhat limited due to motion artifact. There is good opacification of the central and segmental pulmonary arteries. No focal filling defects demonstrated. No evidence of significant pulmonary embolus. Normal caliber thoracic aorta. No aortic dissection. Great vessel origins are patent. Normal heart size. No pericardial effusions. Mediastinum/Nodes: Esophagus is decompressed. Probable small esophageal hiatal hernia. No significant  lymphadenopathy in the chest. Lungs/Pleura: Evaluation is limited by motion artifact. No focal consolidation and no airspace disease identified. No pleural effusions. No pneumothorax. Musculoskeletal: No chest wall abnormality. No acute or significant osseous findings. Review of the MIP images confirms the above findings. CT ABDOMEN and PELVIS FINDINGS Hepatobiliary: Large stones in the gallbladder. No wall thickening or inflammatory changes. No bile duct dilatation. No focal liver lesions. Pancreas: Unremarkable. No pancreatic ductal dilatation or surrounding inflammatory changes. Spleen: Normal in size without focal abnormality. Adrenals/Urinary Tract: No adrenal gland nodules. Renal nephrograms are homogeneous and normal. Right ureteral stent with proximal pigtail in the right renal pelvis and distal pigtail in the bladder. There is some residual hydronephrosis and hydroureter which may indicate poor stent function. Left kidney  and ureter are unremarkable. Small amount of gas in the bladder without bladder wall thickening. This may be due to previous instrumentation. Stomach/Bowel: Gas and fluid in the stomach without abnormal distention or wall thickening. Dilated fluid-filled small bowel with distal decompression. Appearance suspicious for small bowel obstruction. Ileus less likely due to change in caliber. Scattered stool in the colon without significant colonic distention. Anastomosis in the right colon. No definite bowel wall or colonic wall thickening. Vascular/Lymphatic: No significant vascular findings are present. No enlarged abdominal or pelvic lymph nodes. Reproductive: Uterus and ovaries are not enlarged. Other: Since the previous study, there has been interval removal of the partially calcified soft tissue mass seen previously in the right lower quadrant. There is interval presence of diffuse free intra-abdominal air. This is likely residual postoperative gas but follow-up is suggested to exclude a  leak. Several loculated fluid collections are demonstrated in the right lower quadrant and pelvis. Largest is in the posterior pelvis posterior to the uterus and measuring 9.9 x 10.2 cm. Fluid collection anterior to the uterus measures up to about 8.1 cm diameter. Less well defined fluid collection in the right lower quadrant posterior to the cecum in anastomosis measuring about 6.4 cm in diameter. Fluid collections have increased density measurements. There is a small amount of diffuse free fluid throughout the abdomen as well. While these could represent postoperative fluid collections, the appearance is worrisome for multiple abscesses. Seromas or hematomas less likely. Musculoskeletal: No acute or significant osseous findings. Review of the MIP images confirms the above findings. IMPRESSION: CTA CHEST: No evidence of significant pulmonary embolus. No evidence of active pulmonary disease. CT ABDOMEN AND PELVIS: 1. Multiple loculated fluid collections throughout the abdomen and pelvis with well-defined low-density components. These are worrisome for postoperative abscesses. Hematoma/seroma less likely. 2. Diffuse free intraperitoneal air and free fluid, likely postoperative. Consider follow-up to exclude bowel leak. 3. Fluid-filled dilated small bowel loops with distal decompression likely indicating small bowel obstruction. Ileus less likely. No abnormal bowel wall thickening or pneumatosis. 4. Right ureteral stent with proximal pigtail in the right renal pelvis and distal pigtail in the bladder. Residual hydronephrosis and hydroureter may indicate poor stent function. Small amount of gas in the bladder may be due to previous instrumentation. Electronically Signed   By: Lucienne Capers M.D.   On: 05/06/2018 23:49    Procedures Procedures (including critical care time) CRITICAL CARE Performed by: Quintella Reichert   Total critical care time: 35 minutes  Critical care time was exclusive of separately  billable procedures and treating other patients.  Critical care was necessary to treat or prevent imminent or life-threatening deterioration.  Critical care was time spent personally by me on the following activities: development of treatment plan with patient and/or surrogate as well as nursing, discussions with consultants, evaluation of patient's response to treatment, examination of patient, obtaining history from patient or surrogate, ordering and performing treatments and interventions, ordering and review of laboratory studies, ordering and review of radiographic studies, pulse oximetry and re-evaluation of patient's condition.  Medications Ordered in ED Medications  iopamidol (ISOVUE-370) 76 % injection (has no administration in time range)  lactated ringers infusion ( Intravenous New Bag/Given 05/07/18 0017)  sodium chloride 0.9 % bolus 1,000 mL (0 mLs Intravenous Stopped 05/06/18 2247)  iopamidol (ISOVUE-370) 76 % injection 100 mL (100 mLs Intravenous Contrast Given 05/06/18 2255)  ceFEPIme (MAXIPIME) 2 g in sodium chloride 0.9 % 100 mL IVPB (2 g Intravenous New Bag/Given 05/06/18 2316)  And  metroNIDAZOLE (FLAGYL) IVPB 500 mg (0 mg Intravenous Stopped 05/06/18 2355)  fentaNYL (SUBLIMAZE) injection 50 mcg (50 mcg Intravenous Given 05/06/18 2355)     Initial Impression / Assessment and Plan / ED Course  I have reviewed the triage vital signs and the nursing notes.  Pertinent labs & imaging results that were available during my care of the patient were reviewed by me and considered in my medical decision making (see chart for details).     Patient here for evaluation of tachycardia, abdominal bloating one week following laparoscopic surgery. She is tachycardic and diaphoretic on initial ED evaluation. She does have mild tenderness on initial evaluation without peritoneal findings. Labs demonstrate leukocytosis with normal lactate. CT scan concerning for possible intra-abdominal abscess.  On repeat assessment she does have significant increase in her abdominal tenderness diffusely with voluntary guarding. Dr. Marcello Moores with general surgery consulted regarding further treatment.  Final Clinical Impressions(s) / ED Diagnoses   Final diagnoses:  None    ED Discharge Orders    None       Quintella Reichert, MD 05/07/18 [phone]

## 2018-05-06 NOTE — ED Notes (Signed)
EKG Given to Dr. Melina Copa not Dr. Ayesha Rumpf.

## 2018-05-07 ENCOUNTER — Other Ambulatory Visit: Payer: Self-pay

## 2018-05-07 ENCOUNTER — Inpatient Hospital Stay (HOSPITAL_COMMUNITY): Payer: BLUE CROSS/BLUE SHIELD

## 2018-05-07 DIAGNOSIS — Y838 Other surgical procedures as the cause of abnormal reaction of the patient, or of later complication, without mention of misadventure at the time of the procedure: Secondary | ICD-10-CM | POA: Diagnosis present

## 2018-05-07 DIAGNOSIS — Z888 Allergy status to other drugs, medicaments and biological substances status: Secondary | ICD-10-CM | POA: Diagnosis not present

## 2018-05-07 DIAGNOSIS — Z91018 Allergy to other foods: Secondary | ICD-10-CM | POA: Diagnosis not present

## 2018-05-07 DIAGNOSIS — Z9049 Acquired absence of other specified parts of digestive tract: Secondary | ICD-10-CM | POA: Diagnosis not present

## 2018-05-07 DIAGNOSIS — N1330 Unspecified hydronephrosis: Secondary | ICD-10-CM | POA: Diagnosis present

## 2018-05-07 DIAGNOSIS — Z87442 Personal history of urinary calculi: Secondary | ICD-10-CM | POA: Diagnosis not present

## 2018-05-07 DIAGNOSIS — B998 Other infectious disease: Secondary | ICD-10-CM | POA: Diagnosis present

## 2018-05-07 DIAGNOSIS — K219 Gastro-esophageal reflux disease without esophagitis: Secondary | ICD-10-CM | POA: Diagnosis present

## 2018-05-07 DIAGNOSIS — K651 Peritoneal abscess: Secondary | ICD-10-CM | POA: Diagnosis present

## 2018-05-07 DIAGNOSIS — T8140XA Infection following a procedure, unspecified, initial encounter: Secondary | ICD-10-CM | POA: Diagnosis present

## 2018-05-07 DIAGNOSIS — R300 Dysuria: Secondary | ICD-10-CM | POA: Diagnosis present

## 2018-05-07 DIAGNOSIS — Z79899 Other long term (current) drug therapy: Secondary | ICD-10-CM | POA: Diagnosis not present

## 2018-05-07 LAB — BLOOD CULTURE ID PANEL (REFLEXED)
ACINETOBACTER BAUMANNII: NOT DETECTED
CANDIDA KRUSEI: NOT DETECTED
CANDIDA PARAPSILOSIS: NOT DETECTED
CANDIDA TROPICALIS: NOT DETECTED
Candida albicans: NOT DETECTED
Candida glabrata: NOT DETECTED
ESCHERICHIA COLI: NOT DETECTED
Enterobacter cloacae complex: NOT DETECTED
Enterobacteriaceae species: NOT DETECTED
Enterococcus species: NOT DETECTED
Haemophilus influenzae: NOT DETECTED
KLEBSIELLA OXYTOCA: NOT DETECTED
KLEBSIELLA PNEUMONIAE: NOT DETECTED
LISTERIA MONOCYTOGENES: NOT DETECTED
Neisseria meningitidis: NOT DETECTED
Proteus species: NOT DETECTED
Pseudomonas aeruginosa: NOT DETECTED
SERRATIA MARCESCENS: NOT DETECTED
STAPHYLOCOCCUS AUREUS BCID: NOT DETECTED
Staphylococcus species: NOT DETECTED
Streptococcus agalactiae: NOT DETECTED
Streptococcus pneumoniae: NOT DETECTED
Streptococcus pyogenes: NOT DETECTED
Streptococcus species: NOT DETECTED

## 2018-05-07 LAB — CBC
HEMATOCRIT: 24.3 % — AB (ref 36.0–46.0)
Hemoglobin: 8.4 g/dL — ABNORMAL LOW (ref 12.0–15.0)
MCH: 30 pg (ref 26.0–34.0)
MCHC: 34.6 g/dL (ref 30.0–36.0)
MCV: 86.8 fL (ref 78.0–100.0)
PLATELETS: 654 10*3/uL — AB (ref 150–400)
RBC: 2.8 MIL/uL — ABNORMAL LOW (ref 3.87–5.11)
RDW: 14 % (ref 11.5–15.5)
WBC: 21.8 10*3/uL — ABNORMAL HIGH (ref 4.0–10.5)

## 2018-05-07 LAB — BASIC METABOLIC PANEL
Anion gap: 11 (ref 5–15)
BUN: 19 mg/dL (ref 6–20)
CHLORIDE: 97 mmol/L — AB (ref 98–111)
CO2: 26 mmol/L (ref 22–32)
CREATININE: 0.52 mg/dL (ref 0.44–1.00)
Calcium: 8.4 mg/dL — ABNORMAL LOW (ref 8.9–10.3)
GFR calc Af Amer: >60 mL/min (ref >60)
GFR calc non Af Amer: >60 mL/min (ref >60)
GLUCOSE: 138 mg/dL — AB (ref 70–99)
POTASSIUM: 4.2 mmol/L (ref 3.5–5.1)
SODIUM: 134 mmol/L — AB (ref 135–145)

## 2018-05-07 LAB — PROTIME-INR
INR: 1.08
Prothrombin Time: 13.9 seconds (ref 11.4–15.2)

## 2018-05-07 MED ORDER — ONDANSETRON 4 MG PO TBDP: 4.0000 mg | ORAL_TABLET | Freq: Four times a day (QID) | ORAL | Status: DC | PRN

## 2018-05-07 MED ORDER — TRAMADOL HCL 50 MG PO TABS
50.0000 mg | ORAL_TABLET | Freq: Four times a day (QID) | ORAL | Status: DC | PRN
  Administered 2018-05-07 – 2018-05-17 (×11): 50 mg via ORAL
  Administered 2018-05-19: 100 mg via ORAL
  Filled 2018-05-07 (×3): qty 1
  Filled 2018-05-07: qty 2
  Filled 2018-05-07 (×7): qty 1
  Filled 2018-05-07: qty 2
  Filled 2018-05-07: qty 1
  Filled 2018-05-07: qty 2

## 2018-05-07 MED ORDER — HYDROMORPHONE HCL 1 MG/ML IJ SOLN
0.5000 mg | INTRAMUSCULAR | Status: DC | PRN
  Administered 2018-05-08: 0.5 mg via INTRAVENOUS
  Administered 2018-05-08: 1 mg via INTRAVENOUS
  Administered 2018-05-10 – 2018-05-11 (×2): 0.5 mg via INTRAVENOUS
  Administered 2018-05-12 – 2018-05-18 (×7): 1 mg via INTRAVENOUS
  Administered 2018-05-19: 0.5 mg via INTRAVENOUS
  Filled 2018-05-07 (×15): qty 1

## 2018-05-07 MED ORDER — MIDAZOLAM HCL 2 MG/2ML IJ SOLN
INTRAMUSCULAR | Status: AC
  Filled 2018-05-07: qty 4

## 2018-05-07 MED ORDER — MIDAZOLAM HCL 2 MG/2ML IJ SOLN
INTRAMUSCULAR | Status: AC | PRN
  Administered 2018-05-07 (×2): 1 mg via INTRAVENOUS
  Administered 2018-05-07: 2 mg via INTRAVENOUS

## 2018-05-07 MED ORDER — SODIUM CHLORIDE 0.9 % IV BOLUS
1000.0000 mL | Freq: Once | INTRAVENOUS | Status: AC
  Administered 2018-05-07: 1000 mL via INTRAVENOUS

## 2018-05-07 MED ORDER — ACETAMINOPHEN 500 MG PO TABS
1000.0000 mg | ORAL_TABLET | Freq: Four times a day (QID) | ORAL | Status: DC
  Administered 2018-05-07 – 2018-05-19 (×46): 1000 mg via ORAL
  Filled 2018-05-07 (×49): qty 2

## 2018-05-07 MED ORDER — LORATADINE 10 MG PO TABS
10.0000 mg | ORAL_TABLET | Freq: Every day | ORAL | Status: DC
  Administered 2018-05-07 – 2018-05-19 (×12): 10 mg via ORAL
  Filled 2018-05-07 (×13): qty 1

## 2018-05-07 MED ORDER — LACTATED RINGERS IV SOLN
INTRAVENOUS | Status: DC
  Administered 2018-05-07: via INTRAVENOUS

## 2018-05-07 MED ORDER — SODIUM CHLORIDE 0.9 % IV SOLN
2.0000 g | Freq: Every day | INTRAVENOUS | Status: DC
  Administered 2018-05-07 – 2018-05-16 (×11): 2 g via INTRAVENOUS
  Filled 2018-05-07 (×5): qty 2
  Filled 2018-05-07 (×2): qty 20
  Filled 2018-05-07 (×2): qty 2
  Filled 2018-05-07: qty 20
  Filled 2018-05-07: qty 2

## 2018-05-07 MED ORDER — KCL IN DEXTROSE-NACL 20-5-0.45 MEQ/L-%-% IV SOLN
INTRAVENOUS | Status: DC
  Administered 2018-05-07 (×2): via INTRAVENOUS
  Administered 2018-05-08: 50 mL/h via INTRAVENOUS
  Administered 2018-05-08 – 2018-05-14 (×6): via INTRAVENOUS
  Filled 2018-05-07 (×11): qty 1000

## 2018-05-07 MED ORDER — ONDANSETRON HCL 4 MG/2ML IJ SOLN
4.0000 mg | Freq: Four times a day (QID) | INTRAMUSCULAR | Status: DC | PRN
  Administered 2018-05-07 – 2018-05-19 (×20): 4 mg via INTRAVENOUS
  Filled 2018-05-07 (×20): qty 2

## 2018-05-07 MED ORDER — LIDOCAINE HCL (PF) 1 % IJ SOLN
INTRAMUSCULAR | Status: AC | PRN
  Administered 2018-05-07: 20 mL

## 2018-05-07 MED ORDER — DIPHENHYDRAMINE HCL 25 MG PO CAPS: 25.0000 mg | ORAL_CAPSULE | Freq: Four times a day (QID) | ORAL | Status: DC | PRN

## 2018-05-07 MED ORDER — DIPHENHYDRAMINE HCL 50 MG/ML IJ SOLN: 25.0000 mg | Freq: Four times a day (QID) | INTRAMUSCULAR | Status: DC | PRN

## 2018-05-07 MED ORDER — FENTANYL CITRATE (PF) 100 MCG/2ML IJ SOLN
INTRAMUSCULAR | Status: AC
  Filled 2018-05-07: qty 2

## 2018-05-07 MED ORDER — METRONIDAZOLE IN NACL 5-0.79 MG/ML-% IV SOLN
500.0000 mg | Freq: Three times a day (TID) | INTRAVENOUS | Status: DC
  Administered 2018-05-07 – 2018-05-17 (×31): 500 mg via INTRAVENOUS
  Filled 2018-05-07 (×32): qty 100

## 2018-05-07 MED ORDER — FENTANYL CITRATE (PF) 100 MCG/2ML IJ SOLN
INTRAMUSCULAR | Status: AC | PRN
  Administered 2018-05-07 (×2): 50 ug via INTRAVENOUS

## 2018-05-07 MED ORDER — LACTATED RINGERS IV SOLN
INTRAVENOUS | Status: DC
  Administered 2018-05-07 (×2): via INTRAVENOUS

## 2018-05-07 MED ORDER — ENOXAPARIN SODIUM 40 MG/0.4ML ~~LOC~~ SOLN
40.0000 mg | Freq: Every day | SUBCUTANEOUS | Status: DC
  Administered 2018-05-07 – 2018-05-10 (×4): 40 mg via SUBCUTANEOUS
  Filled 2018-05-07 (×5): qty 0.4

## 2018-05-07 MED ORDER — SODIUM CHLORIDE 0.9% FLUSH
5.0000 mL | Freq: Three times a day (TID) | INTRAVENOUS | Status: DC
  Administered 2018-05-07 – 2018-05-19 (×31): 5 mL

## 2018-05-07 NOTE — Progress Notes (Signed)
Subjective/Chief Complaint: C/o nausea and bloating.    Objective: Vital signs in last 24 hours: Temp:  [98.7 F (37.1 C)-100.5 F (38.1 C)] 99.3 F (37.4 C) (09/19 0518) Pulse Rate:  [114-138] 114 (09/19 0608) Resp:  [17-29] 20 (09/19 0518) BP: (117-128)/(68-89) 121/71 (09/19 0518) SpO2:  [95 %-100 %] 100 % (09/19 0518) Weight:  [59 kg] 59 kg (09/18 2027) Last BM Date: 05/06/18  Intake/Output from previous day: 09/18 0701 - 09/19 0700 In: 1837.2 [P.O.:200; I.V.:471.4; IV Piggyback:1165.8] Out: -  Intake/Output this shift: Total I/O In: 584.4 [I.V.:484.4; IV Piggyback:100] Out: -   General appearance: alert, cooperative and mild distress Resp: breathing comfortably GI: soft, distended, approp tender. Extremities: extremities normal, atraumatic, no cyanosis or edema  Lab Results:  Recent Labs    05/06/18 2104 05/06/18 2112 05/07/18 0434  WBC 22.7*  --  21.8*  HGB 9.5* 9.9* 8.4*  HCT 27.5* 29.0* 24.3*  PLT 728*  --  654*   BMET Recent Labs    05/06/18 2104 05/06/18 2112 05/07/18 0434  NA 132* 126* 134*  K 4.3 4.2 4.2  CL 91* 88* 97*  CO2 25  --  26  GLUCOSE 167* 171* 138*  BUN 22* 22* 19  CREATININE 0.64 0.70 0.52  CALCIUM 9.4  --  8.4*   PT/INR No results for input(s): LABPROT, INR in the last 72 hours. ABG No results for input(s): PHART, HCO3 in the last 72 hours.  Invalid input(s): PCO2, PO2  Studies/Results: Ct Angio Chest Pe W/cm &/or Wo Cm  Result Date: 05/06/2018 CLINICAL DATA:  One week postop from laparoscopic hernia repair. Lots of gas and discomfort. Bloating, tachycardia, and fever. Concern about heart rate. EXAM: CT ANGIOGRAPHY CHEST CT ABDOMEN AND PELVIS WITH CONTRAST TECHNIQUE: Multidetector CT imaging of the chest was performed using the standard protocol during bolus administration of intravenous contrast. Multiplanar CT image reconstructions and MIPs were obtained to evaluate the vascular anatomy. Multidetector CT imaging of the  abdomen and pelvis was performed using the standard protocol during bolus administration of intravenous contrast. CONTRAST:  162mL ISOVUE-370 IOPAMIDOL (ISOVUE-370) INJECTION 76% COMPARISON:  CT abdomen and pelvis 02/28/2018 FINDINGS: CTA CHEST FINDINGS Cardiovascular: Examination is somewhat limited due to motion artifact. There is good opacification of the central and segmental pulmonary arteries. No focal filling defects demonstrated. No evidence of significant pulmonary embolus. Normal caliber thoracic aorta. No aortic dissection. Great vessel origins are patent. Normal heart size. No pericardial effusions. Mediastinum/Nodes: Esophagus is decompressed. Probable small esophageal hiatal hernia. No significant lymphadenopathy in the chest. Lungs/Pleura: Evaluation is limited by motion artifact. No focal consolidation and no airspace disease identified. No pleural effusions. No pneumothorax. Musculoskeletal: No chest wall abnormality. No acute or significant osseous findings. Review of the MIP images confirms the above findings. CT ABDOMEN and PELVIS FINDINGS Hepatobiliary: Large stones in the gallbladder. No wall thickening or inflammatory changes. No bile duct dilatation. No focal liver lesions. Pancreas: Unremarkable. No pancreatic ductal dilatation or surrounding inflammatory changes. Spleen: Normal in size without focal abnormality. Adrenals/Urinary Tract: No adrenal gland nodules. Renal nephrograms are homogeneous and normal. Right ureteral stent with proximal pigtail in the right renal pelvis and distal pigtail in the bladder. There is some residual hydronephrosis and hydroureter which may indicate poor stent function. Left kidney and ureter are unremarkable. Small amount of gas in the bladder without bladder wall thickening. This may be due to previous instrumentation. Stomach/Bowel: Gas and fluid in the stomach without abnormal distention or wall thickening. Dilated  fluid-filled small bowel with distal  decompression. Appearance suspicious for small bowel obstruction. Ileus less likely due to change in caliber. Scattered stool in the colon without significant colonic distention. Anastomosis in the right colon. No definite bowel wall or colonic wall thickening. Vascular/Lymphatic: No significant vascular findings are present. No enlarged abdominal or pelvic lymph nodes. Reproductive: Uterus and ovaries are not enlarged. Other: Since the previous study, there has been interval removal of the partially calcified soft tissue mass seen previously in the right lower quadrant. There is interval presence of diffuse free intra-abdominal air. This is likely residual postoperative gas but follow-up is suggested to exclude a leak. Several loculated fluid collections are demonstrated in the right lower quadrant and pelvis. Largest is in the posterior pelvis posterior to the uterus and measuring 9.9 x 10.2 cm. Fluid collection anterior to the uterus measures up to about 8.1 cm diameter. Less well defined fluid collection in the right lower quadrant posterior to the cecum in anastomosis measuring about 6.4 cm in diameter. Fluid collections have increased density measurements. There is a small amount of diffuse free fluid throughout the abdomen as well. While these could represent postoperative fluid collections, the appearance is worrisome for multiple abscesses. Seromas or hematomas less likely. Musculoskeletal: No acute or significant osseous findings. Review of the MIP images confirms the above findings. IMPRESSION: CTA CHEST: No evidence of significant pulmonary embolus. No evidence of active pulmonary disease. CT ABDOMEN AND PELVIS: 1. Multiple loculated fluid collections throughout the abdomen and pelvis with well-defined low-density components. These are worrisome for postoperative abscesses. Hematoma/seroma less likely. 2. Diffuse free intraperitoneal air and free fluid, likely postoperative. Consider follow-up to exclude  bowel leak. 3. Fluid-filled dilated small bowel loops with distal decompression likely indicating small bowel obstruction. Ileus less likely. No abnormal bowel wall thickening or pneumatosis. 4. Right ureteral stent with proximal pigtail in the right renal pelvis and distal pigtail in the bladder. Residual hydronephrosis and hydroureter may indicate poor stent function. Small amount of gas in the bladder may be due to previous instrumentation. Electronically Signed   By: Lucienne Capers M.D.   On: 05/06/2018 23:49   Ct Abdomen Pelvis W Contrast  Result Date: 05/06/2018 CLINICAL DATA:  One week postop from laparoscopic hernia repair. Lots of gas and discomfort. Bloating, tachycardia, and fever. Concern about heart rate. EXAM: CT ANGIOGRAPHY CHEST CT ABDOMEN AND PELVIS WITH CONTRAST TECHNIQUE: Multidetector CT imaging of the chest was performed using the standard protocol during bolus administration of intravenous contrast. Multiplanar CT image reconstructions and MIPs were obtained to evaluate the vascular anatomy. Multidetector CT imaging of the abdomen and pelvis was performed using the standard protocol during bolus administration of intravenous contrast. CONTRAST:  142mL ISOVUE-370 IOPAMIDOL (ISOVUE-370) INJECTION 76% COMPARISON:  CT abdomen and pelvis 02/28/2018 FINDINGS: CTA CHEST FINDINGS Cardiovascular: Examination is somewhat limited due to motion artifact. There is good opacification of the central and segmental pulmonary arteries. No focal filling defects demonstrated. No evidence of significant pulmonary embolus. Normal caliber thoracic aorta. No aortic dissection. Great vessel origins are patent. Normal heart size. No pericardial effusions. Mediastinum/Nodes: Esophagus is decompressed. Probable small esophageal hiatal hernia. No significant lymphadenopathy in the chest. Lungs/Pleura: Evaluation is limited by motion artifact. No focal consolidation and no airspace disease identified. No pleural  effusions. No pneumothorax. Musculoskeletal: No chest wall abnormality. No acute or significant osseous findings. Review of the MIP images confirms the above findings. CT ABDOMEN and PELVIS FINDINGS Hepatobiliary: Large stones in the gallbladder. No wall  thickening or inflammatory changes. No bile duct dilatation. No focal liver lesions. Pancreas: Unremarkable. No pancreatic ductal dilatation or surrounding inflammatory changes. Spleen: Normal in size without focal abnormality. Adrenals/Urinary Tract: No adrenal gland nodules. Renal nephrograms are homogeneous and normal. Right ureteral stent with proximal pigtail in the right renal pelvis and distal pigtail in the bladder. There is some residual hydronephrosis and hydroureter which may indicate poor stent function. Left kidney and ureter are unremarkable. Small amount of gas in the bladder without bladder wall thickening. This may be due to previous instrumentation. Stomach/Bowel: Gas and fluid in the stomach without abnormal distention or wall thickening. Dilated fluid-filled small bowel with distal decompression. Appearance suspicious for small bowel obstruction. Ileus less likely due to change in caliber. Scattered stool in the colon without significant colonic distention. Anastomosis in the right colon. No definite bowel wall or colonic wall thickening. Vascular/Lymphatic: No significant vascular findings are present. No enlarged abdominal or pelvic lymph nodes. Reproductive: Uterus and ovaries are not enlarged. Other: Since the previous study, there has been interval removal of the partially calcified soft tissue mass seen previously in the right lower quadrant. There is interval presence of diffuse free intra-abdominal air. This is likely residual postoperative gas but follow-up is suggested to exclude a leak. Several loculated fluid collections are demonstrated in the right lower quadrant and pelvis. Largest is in the posterior pelvis posterior to the uterus  and measuring 9.9 x 10.2 cm. Fluid collection anterior to the uterus measures up to about 8.1 cm diameter. Less well defined fluid collection in the right lower quadrant posterior to the cecum in anastomosis measuring about 6.4 cm in diameter. Fluid collections have increased density measurements. There is a small amount of diffuse free fluid throughout the abdomen as well. While these could represent postoperative fluid collections, the appearance is worrisome for multiple abscesses. Seromas or hematomas less likely. Musculoskeletal: No acute or significant osseous findings. Review of the MIP images confirms the above findings. IMPRESSION: CTA CHEST: No evidence of significant pulmonary embolus. No evidence of active pulmonary disease. CT ABDOMEN AND PELVIS: 1. Multiple loculated fluid collections throughout the abdomen and pelvis with well-defined low-density components. These are worrisome for postoperative abscesses. Hematoma/seroma less likely. 2. Diffuse free intraperitoneal air and free fluid, likely postoperative. Consider follow-up to exclude bowel leak. 3. Fluid-filled dilated small bowel loops with distal decompression likely indicating small bowel obstruction. Ileus less likely. No abnormal bowel wall thickening or pneumatosis. 4. Right ureteral stent with proximal pigtail in the right renal pelvis and distal pigtail in the bladder. Residual hydronephrosis and hydroureter may indicate poor stent function. Small amount of gas in the bladder may be due to previous instrumentation. Electronically Signed   By: Lucienne Capers M.D.   On: 05/06/2018 23:49    Anti-infectives: Anti-infectives (From admission, onward)   Start     Dose/Rate Route Frequency Ordered Stop   05/07/18 0630  metroNIDAZOLE (FLAGYL) IVPB 500 mg     500 mg 100 mL/hr over 60 Minutes Intravenous Every 8 hours 05/07/18 0144     05/07/18 0300  cefTRIAXone (ROCEPHIN) 2 g in sodium chloride 0.9 % 100 mL IVPB     2 g 200 mL/hr over 30  Minutes Intravenous Daily at bedtime 05/07/18 0144     05/06/18 2300  ceFEPIme (MAXIPIME) 2 g in sodium chloride 0.9 % 100 mL IVPB     2 g 200 mL/hr over 30 Minutes Intravenous  Once 05/06/18 2256 05/06/18 2346   05/06/18 2300  metroNIDAZOLE (FLAGYL) IVPB 500 mg     500 mg 100 mL/hr over 60 Minutes Intravenous  Once 05/06/18 2256 05/06/18 2355      Assessment/Plan: s/p * No surgery found *  S/p right hemicolectomy c/b bleeding, now with likely infected hematoma.    iv antibiotics  NPo Perc drain to abscess in pelvis.   LOS: 0 days    Stark Klein 05/07/2018

## 2018-05-07 NOTE — Progress Notes (Signed)
MEDICATION-RELATED CONSULT NOTE   IR Procedure Consult - Anticoagulant/Antiplatelet PTA/Inpatient Med List Review by Pharmacist    Procedure: CT guided R transgluteal drain    Completed: 9166 today  Post-Procedural bleeding risk per IR MD assessment: LOW  Antithrombotic medications on inpatient or PTA profile prior to procedure:  None, Lovenox ordered to start tonight    Recommended restart time per IR Post-Procedure Guidelines:  4 hrs or next standard dosing interval (if > 4 hrs)   Other considerations:  none   Plan:      Lovenox 40 mg SQ q24 hr already ordered to start at 2200 tonight; this is ~4 hrs and at next standard dosing interval  Reuel Boom, PharmD, BCPS [phone] 05/07/2018, 6:50 PM

## 2018-05-07 NOTE — Consult Note (Signed)
Chief Complaint: Patient was seen in consultation today for postprocedural intraabdominal abscess.  Referring Physician(s): Stark Klein  Supervising Physician: Corrie Mckusick  Patient Status: [facility] - In-pt  History of Present Illness: Erica Ross is a 42 y.o. female with a past medical history of GERD, umbilical hernia, cholelithiasis, nephrolithiasis, cystitis, adnexal mass, left breast mass, and hay fever. She was found to have an adnexal mass that was obstructing her right ureter and underwent a diagnostic laparoscopy with Dr. Dorris Fetch and cystoscopy with right ureteral stent placement with Dr. Louis Meckel on 04/29/2018. She was then discharged home 05/03/2018. She presented to ED 05/06/2018 with complaints of tachycardia and weakness. She was found to have a postprocedural intraabdominal abscess.  CT abdomen/pelvis 05/06/2018: 1. Multiple loculated fluid collections throughout the abdomen and pelvis with well-defined low-density components. These are worrisome for postoperative abscesses. Hematoma/seroma less likely. 2. Diffuse free intraperitoneal air and free fluid, likely postoperative. Consider follow-up to exclude bowel leak. 3. Fluid-filled dilated small bowel loops with distal decompression likely indicating small bowel obstruction. Ileus less likely. No abnormal bowel wall thickening or pneumatosis. 4. Right ureteral stent with proximal pigtail in the right renal pelvis and distal pigtail in the bladder. Residual hydronephrosis and hydroureter may indicate poor stent function. Small amount of gas in the bladder may be due to previous instrumentation.  IR requested by Dr. Barry Dienes for possible image-guided percutaneous intraabdominal abscess drain placement. Patient awake and alert laying in bed. Accompanied by husband at bedside. Complains of abdominal pain, stable at this time. Denies fever, chills, chest pain, dyspnea, dizziness, or headache.   Past Medical History:  Diagnosis  Date  . Adnexal mass 03/02/2018   Right 5.1cm  . Gallstones   . GERD (gastroesophageal reflux disease)    occ   . Hay fever   . Hernia, umbilical   . Kidney stones   . Left breast mass   . Renal calculus, right   . UTI (urinary tract infection)     Past Surgical History:  Procedure Laterality Date  . CYSTOSCOPY WITH STENT PLACEMENT Bilateral 04/29/2018   Procedure: CYSTOSCOPY WITH  BILATERAL RETROGRADE PYELOGRAM RIGHT STENT PLACEMENT;  Surgeon: Ardis Hughs, MD;  Location: [facility]S;  Service: Urology;  Laterality: Bilateral;  . LAPAROSCOPIC REMOVAL ABDOMINAL MASS N/A 04/29/2018   Procedure: LAPAROSCOPIC RIGHT HEMI-COLECTOMY ERAS PATHWAY;  Surgeon: Stark Klein, MD;  Location: [facility];  Service: General;  Laterality: N/A;    Allergies: Watermelon [citrullus vulgaris] and Amoxicillin  Medications: Prior to Admission medications   Medication Sig Start Date End Date Taking? Authorizing Provider  acetaminophen (TYLENOL) 500 MG tablet Take 500-1,000 mg by mouth daily as needed for moderate pain.   Yes [provider]  Ascorbic Acid (VITAMIN C) 1000 MG tablet Take 1,000 mg by mouth daily. w/Elderberry   Yes [provider]  Cholecalciferol (VITAMIN D-3) 5000 units TABS Take 1,500 Units by mouth daily.    Yes [provider]  IRON PO Take 1 tablet by mouth daily.   Yes [provider]  Misc Natural Products (SUPER GREENS) POWD Take 1 Scoop by mouth daily.   Yes [provider]  Probiotic Product (PROBIOTIC PO) Take 1 capsule by mouth daily.   Yes [provider]  traMADol (ULTRAM) 50 MG tablet Take 1-2 tablets (50-100 mg total) by mouth every 6 (six) hours as needed. 05/03/18  Yes Armandina Gemma, MD  Diindolylmethane POWD Take 1 capsule by mouth daily. ([place])    [provider]  EVENING PRIMROSE OIL PO Take 1,200 mg by mouth daily.    [provider]  nitrofurantoin, macrocrystal-monohydrate, (MACROBID) 100 MG capsule  Take 1 capsule (100 mg total) by mouth 2 (two) times daily. 1 po BId Patient not taking: Reported on 05/06/2018 03/21/18   Dettinger, Fransisca Kaufmann, MD  Omega-3 Fatty Acids (FISH OIL OMEGA-3 PO) Take 2 capsules by mouth daily.     [provider]  RESVERATROL PO Take 1 capsule by mouth daily.     [provider]  SODIUM BICARBONATE PO Take 2 capsules by mouth at bedtime.    [provider]     Family History  Problem Relation Age of Onset  . Diabetes Mother   . Aneurysm Father   . Diabetes Brother   . Breast cancer Paternal Grandmother   . Breast cancer Other     Social History   Socioeconomic History  . Marital status: Married    Spouse name: Not on file  . Number of children: Not on file  . Years of education: Not on file  . Highest education level: Not on file  Occupational History  . Not on file  Social Needs  . Financial resource strain: Not on file  . Food insecurity:    Worry: Not on file    Inability: Not on file  . Transportation needs:    Medical: Not on file    Non-medical: Not on file  Tobacco Use  . Smoking status: Never Smoker  . Smokeless tobacco: Never Used  Substance and Sexual Activity  . Alcohol use: No    Alcohol/week: 0.0 standard drinks  . Drug use: No  . Sexual activity: Yes  Lifestyle  . Physical activity:    Days per week: Not on file    Minutes per session: Not on file  . Stress: Not on file  Relationships  . Social connections:    Talks on phone: Not on file    Gets together: Not on file    Attends religious service: Not on file    Active member of club or organization: Not on file    Attends meetings of clubs or organizations: Not on file    Relationship status: Not on file  Other Topics Concern  . Not on file  Social History Narrative  . Not on file     Review of Systems: A 12 point ROS discussed and pertinent positives are indicated in the HPI above.  All other systems are negative.  Review of Systems    Constitutional: Negative for chills and fever.  Respiratory: Negative for shortness of breath and wheezing.   Cardiovascular: Negative for chest pain and palpitations.  Gastrointestinal: Positive for abdominal pain.  Neurological: Negative for dizziness and headaches.  Psychiatric/Behavioral: Negative for behavioral problems and confusion.    Vital Signs: BP 121/71 (BP Location: Left Arm)   Pulse (!) 114   Temp 99.3 F (37.4 C) (Oral)   Resp 20   Ht 5\' 4"  (1.626 m)   Wt 130 lb (59 kg)   LMP 04/29/2018   SpO2 100%   BMI 22.31 kg/m   Physical Exam  Constitutional: She is oriented to person, place, and time. She appears well-developed and well-nourished. No distress.  Cardiovascular: Regular rhythm and normal heart sounds.  No murmur heard. Tachycardic.  Pulmonary/Chest: Effort normal and breath sounds normal. No respiratory distress. She has no wheezes.  Abdominal: Soft. There is tenderness.  Neurological: She is alert and oriented to person, place,  and time.  Skin: Skin is warm and dry.  Psychiatric: She has a normal mood and affect. Her behavior is normal. Judgment and thought content normal.  Nursing note and vitals reviewed.    MD Evaluation Airway: WNL Heart: WNL Abdomen: WNL Chest/ Lungs: WNL ASA  Classification: 2 Mallampati/Airway Score: One   Imaging: Ct Angio Chest Pe W/cm &/or Wo Cm  Result Date: 05/06/2018 CLINICAL DATA:  One week postop from laparoscopic hernia repair. Lots of gas and discomfort. Bloating, tachycardia, and fever. Concern about heart rate. EXAM: CT ANGIOGRAPHY CHEST CT ABDOMEN AND PELVIS WITH CONTRAST TECHNIQUE: Multidetector CT imaging of the chest was performed using the standard protocol during bolus administration of intravenous contrast. Multiplanar CT image reconstructions and MIPs were obtained to evaluate the vascular anatomy. Multidetector CT imaging of the abdomen and pelvis was performed using the standard protocol during bolus  administration of intravenous contrast. CONTRAST:  185mL ISOVUE-370 IOPAMIDOL (ISOVUE-370) INJECTION 76% COMPARISON:  CT abdomen and pelvis 02/28/2018 FINDINGS: CTA CHEST FINDINGS Cardiovascular: Examination is somewhat limited due to motion artifact. There is good opacification of the central and segmental pulmonary arteries. No focal filling defects demonstrated. No evidence of significant pulmonary embolus. Normal caliber thoracic aorta. No aortic dissection. Great vessel origins are patent. Normal heart size. No pericardial effusions. Mediastinum/Nodes: Esophagus is decompressed. Probable small esophageal hiatal hernia. No significant lymphadenopathy in the chest. Lungs/Pleura: Evaluation is limited by motion artifact. No focal consolidation and no airspace disease identified. No pleural effusions. No pneumothorax. Musculoskeletal: No chest wall abnormality. No acute or significant osseous findings. Review of the MIP images confirms the above findings. CT ABDOMEN and PELVIS FINDINGS Hepatobiliary: Large stones in the gallbladder. No wall thickening or inflammatory changes. No bile duct dilatation. No focal liver lesions. Pancreas: Unremarkable. No pancreatic ductal dilatation or surrounding inflammatory changes. Spleen: Normal in size without focal abnormality. Adrenals/Urinary Tract: No adrenal gland nodules. Renal nephrograms are homogeneous and normal. Right ureteral stent with proximal pigtail in the right renal pelvis and distal pigtail in the bladder. There is some residual hydronephrosis and hydroureter which may indicate poor stent function. Left kidney and ureter are unremarkable. Small amount of gas in the bladder without bladder wall thickening. This may be due to previous instrumentation. Stomach/Bowel: Gas and fluid in the stomach without abnormal distention or wall thickening. Dilated fluid-filled small bowel with distal decompression. Appearance suspicious for small bowel obstruction. Ileus less  likely due to change in caliber. Scattered stool in the colon without significant colonic distention. Anastomosis in the right colon. No definite bowel wall or colonic wall thickening. Vascular/Lymphatic: No significant vascular findings are present. No enlarged abdominal or pelvic lymph nodes. Reproductive: Uterus and ovaries are not enlarged. Other: Since the previous study, there has been interval removal of the partially calcified soft tissue mass seen previously in the right lower quadrant. There is interval presence of diffuse free intra-abdominal air. This is likely residual postoperative gas but follow-up is suggested to exclude a leak. Several loculated fluid collections are demonstrated in the right lower quadrant and pelvis. Largest is in the posterior pelvis posterior to the uterus and measuring 9.9 x 10.2 cm. Fluid collection anterior to the uterus measures up to about 8.1 cm diameter. Less well defined fluid collection in the right lower quadrant posterior to the cecum in anastomosis measuring about 6.4 cm in diameter. Fluid collections have increased density measurements. There is a small amount of diffuse free fluid throughout the abdomen as well. While these could represent postoperative  fluid collections, the appearance is worrisome for multiple abscesses. Seromas or hematomas less likely. Musculoskeletal: No acute or significant osseous findings. Review of the MIP images confirms the above findings. IMPRESSION: CTA CHEST: No evidence of significant pulmonary embolus. No evidence of active pulmonary disease. CT ABDOMEN AND PELVIS: 1. Multiple loculated fluid collections throughout the abdomen and pelvis with well-defined low-density components. These are worrisome for postoperative abscesses. Hematoma/seroma less likely. 2. Diffuse free intraperitoneal air and free fluid, likely postoperative. Consider follow-up to exclude bowel leak. 3. Fluid-filled dilated small bowel loops with distal  decompression likely indicating small bowel obstruction. Ileus less likely. No abnormal bowel wall thickening or pneumatosis. 4. Right ureteral stent with proximal pigtail in the right renal pelvis and distal pigtail in the bladder. Residual hydronephrosis and hydroureter may indicate poor stent function. Small amount of gas in the bladder may be due to previous instrumentation. Electronically Signed   By: Lucienne Capers M.D.   On: 05/06/2018 23:49   Ct Abdomen Pelvis W Contrast  Result Date: 05/06/2018 CLINICAL DATA:  One week postop from laparoscopic hernia repair. Lots of gas and discomfort. Bloating, tachycardia, and fever. Concern about heart rate. EXAM: CT ANGIOGRAPHY CHEST CT ABDOMEN AND PELVIS WITH CONTRAST TECHNIQUE: Multidetector CT imaging of the chest was performed using the standard protocol during bolus administration of intravenous contrast. Multiplanar CT image reconstructions and MIPs were obtained to evaluate the vascular anatomy. Multidetector CT imaging of the abdomen and pelvis was performed using the standard protocol during bolus administration of intravenous contrast. CONTRAST:  121mL ISOVUE-370 IOPAMIDOL (ISOVUE-370) INJECTION 76% COMPARISON:  CT abdomen and pelvis 02/28/2018 FINDINGS: CTA CHEST FINDINGS Cardiovascular: Examination is somewhat limited due to motion artifact. There is good opacification of the central and segmental pulmonary arteries. No focal filling defects demonstrated. No evidence of significant pulmonary embolus. Normal caliber thoracic aorta. No aortic dissection. Great vessel origins are patent. Normal heart size. No pericardial effusions. Mediastinum/Nodes: Esophagus is decompressed. Probable small esophageal hiatal hernia. No significant lymphadenopathy in the chest. Lungs/Pleura: Evaluation is limited by motion artifact. No focal consolidation and no airspace disease identified. No pleural effusions. No pneumothorax. Musculoskeletal: No chest wall abnormality.  No acute or significant osseous findings. Review of the MIP images confirms the above findings. CT ABDOMEN and PELVIS FINDINGS Hepatobiliary: Large stones in the gallbladder. No wall thickening or inflammatory changes. No bile duct dilatation. No focal liver lesions. Pancreas: Unremarkable. No pancreatic ductal dilatation or surrounding inflammatory changes. Spleen: Normal in size without focal abnormality. Adrenals/Urinary Tract: No adrenal gland nodules. Renal nephrograms are homogeneous and normal. Right ureteral stent with proximal pigtail in the right renal pelvis and distal pigtail in the bladder. There is some residual hydronephrosis and hydroureter which may indicate poor stent function. Left kidney and ureter are unremarkable. Small amount of gas in the bladder without bladder wall thickening. This may be due to previous instrumentation. Stomach/Bowel: Gas and fluid in the stomach without abnormal distention or wall thickening. Dilated fluid-filled small bowel with distal decompression. Appearance suspicious for small bowel obstruction. Ileus less likely due to change in caliber. Scattered stool in the colon without significant colonic distention. Anastomosis in the right colon. No definite bowel wall or colonic wall thickening. Vascular/Lymphatic: No significant vascular findings are present. No enlarged abdominal or pelvic lymph nodes. Reproductive: Uterus and ovaries are not enlarged. Other: Since the previous study, there has been interval removal of the partially calcified soft tissue mass seen previously in the right lower quadrant. There is interval presence  of diffuse free intra-abdominal air. This is likely residual postoperative gas but follow-up is suggested to exclude a leak. Several loculated fluid collections are demonstrated in the right lower quadrant and pelvis. Largest is in the posterior pelvis posterior to the uterus and measuring 9.9 x 10.2 cm. Fluid collection anterior to the uterus  measures up to about 8.1 cm diameter. Less well defined fluid collection in the right lower quadrant posterior to the cecum in anastomosis measuring about 6.4 cm in diameter. Fluid collections have increased density measurements. There is a small amount of diffuse free fluid throughout the abdomen as well. While these could represent postoperative fluid collections, the appearance is worrisome for multiple abscesses. Seromas or hematomas less likely. Musculoskeletal: No acute or significant osseous findings. Review of the MIP images confirms the above findings. IMPRESSION: CTA CHEST: No evidence of significant pulmonary embolus. No evidence of active pulmonary disease. CT ABDOMEN AND PELVIS: 1. Multiple loculated fluid collections throughout the abdomen and pelvis with well-defined low-density components. These are worrisome for postoperative abscesses. Hematoma/seroma less likely. 2. Diffuse free intraperitoneal air and free fluid, likely postoperative. Consider follow-up to exclude bowel leak. 3. Fluid-filled dilated small bowel loops with distal decompression likely indicating small bowel obstruction. Ileus less likely. No abnormal bowel wall thickening or pneumatosis. 4. Right ureteral stent with proximal pigtail in the right renal pelvis and distal pigtail in the bladder. Residual hydronephrosis and hydroureter may indicate poor stent function. Small amount of gas in the bladder may be due to previous instrumentation. Electronically Signed   By: Lucienne Capers M.D.   On: 05/06/2018 23:49   Dg C-arm 1-60 Min-no Report  Result Date: 04/29/2018 Fluoroscopy was utilized by the requesting physician.  No radiographic interpretation.    Labs:  CBC: Recent Labs    05/02/18 0408  05/03/18 0356 05/06/18 2104 05/06/18 2112 05/07/18 0434  WBC 9.1  --  8.6 22.7*  --  21.8*  HGB 6.8*   < > 7.4* 9.5* 9.9* 8.4*  HCT 20.9*   < > 22.5* 27.5* 29.0* 24.3*  PLT 191  --  259 728*  --  654*   < > = values in  this interval not displayed.    COAGS: No results for input(s): INR, APTT in the last 8760 hours.  BMP: Recent Labs    05/02/18 0408 05/03/18 0356 05/06/18 2104 05/06/18 2112 05/07/18 0434  NA 143 142 132* 126* 134*  K 4.3 3.6 4.3 4.2 4.2  CL 107 106 91* 88* 97*  CO2 28 27 25   --  26  GLUCOSE 104* 128* 167* 171* 138*  BUN 9 15 22* 22* 19  CALCIUM 8.5* 8.8* 9.4  --  8.4*  CREATININE 0.55 0.50 0.64 0.70 0.52  GFRNONAA >60 >60 >60  --  >60  GFRAA >60 >60 >60  --  >60    LIVER FUNCTION TESTS: Recent Labs    02/10/18 1037 05/06/18 2104  BILITOT 0.3 1.0  AST 16 26  ALT 14 18  ALKPHOS 68 76  PROT 7.6 6.7  ALBUMIN 4.4 2.5*    TUMOR MARKERS: Recent Labs    04/22/18 [place] <1    Assessment and Plan:  Postprocedural intraabdominal abscess. Plan for image-guided percutaneous intraabdominal abscess drain placement tentatively today with Dr. Earleen Newport. Patient is NPO. She does not take blood thinners. INR pending.  Risks and benefits discussed with the patient including bleeding, infection, damage to adjacent structures, bowel perforation/fistula connection, and sepsis. All of the patient's questions  were answered, patient is agreeable to proceed. Consent signed and in chart.   Thank you for this interesting consult.  I greatly enjoyed meeting Erica Ross and look forward to participating in their care.  A copy of this report was sent to the requesting provider on this date.  Electronically Signed: Earley Abide, PA-C 05/07/2018, 11:51 AM   I spent a total of 20 Minutes in face to face in clinical consultation, greater than 50% of which was counseling/coordinating care for postprocedural intraabdominal abscess.

## 2018-05-07 NOTE — Procedures (Addendum)
Interventional Radiology Procedure Note  Procedure: CT guided right transgluteal drain.  1F drain. ~350cc of dark bloody fluid removed. .  Complications: None Recommendations:   -culture sent - OK from VIR standpoint to advance diet.  Primary team can place order at discretion - Do not submerge  - Routine care   Signed,  Dulcy Fanny. Earleen Newport, DO

## 2018-05-07 NOTE — Progress Notes (Signed)
PHARMACY - PHYSICIAN COMMUNICATION CRITICAL VALUE ALERT - BLOOD CULTURE IDENTIFICATION (BCID)  Erica Ross is an 42 y.o. female who presented to [facility] on 05/06/2018 with a chief complaint of nausea and bloating.   Assessment:  S/p CT guided R transgluteal drain (include suspected source if known) 1 of 4 bottles Gm- rods, No Growth per BCID Name of physician (or Provider) ContactedBarry Dienes, F  Current antibiotics: Rocephin 2 Gm IV q24h  Changes to prescribed antibiotics recommended:  Patient is on recommended antibiotics - No changes needed  Results for orders placed or performed during the hospital encounter of 05/06/18  Blood Culture ID Panel (Reflexed) (Collected: 05/06/2018  9:29 PM)  Result Value Ref Range   Enterococcus species NOT DETECTED NOT DETECTED   Listeria monocytogenes NOT DETECTED NOT DETECTED   Staphylococcus species NOT DETECTED NOT DETECTED   Staphylococcus aureus NOT DETECTED NOT DETECTED   Streptococcus species NOT DETECTED NOT DETECTED   Streptococcus agalactiae NOT DETECTED NOT DETECTED   Streptococcus pneumoniae NOT DETECTED NOT DETECTED   Streptococcus pyogenes NOT DETECTED NOT DETECTED   Acinetobacter baumannii NOT DETECTED NOT DETECTED   Enterobacteriaceae species NOT DETECTED NOT DETECTED   Enterobacter cloacae complex NOT DETECTED NOT DETECTED   Escherichia coli NOT DETECTED NOT DETECTED   Klebsiella oxytoca NOT DETECTED NOT DETECTED   Klebsiella pneumoniae NOT DETECTED NOT DETECTED   Proteus species NOT DETECTED NOT DETECTED   Serratia marcescens NOT DETECTED NOT DETECTED   Haemophilus influenzae NOT DETECTED NOT DETECTED   Neisseria meningitidis NOT DETECTED NOT DETECTED   Pseudomonas aeruginosa NOT DETECTED NOT DETECTED   Candida albicans NOT DETECTED NOT DETECTED   Candida glabrata NOT DETECTED NOT DETECTED   Candida krusei NOT DETECTED NOT DETECTED   Candida parapsilosis NOT DETECTED NOT DETECTED   Candida tropicalis NOT DETECTED NOT  DETECTED    [name] 05/07/2018  11:06 PM

## 2018-05-07 NOTE — H&P (Signed)
[name] is an 42 y.o. female.   Chief Complaint: tachycardia, fevers HPI: Pt presents to the emergency department for evaluation of tachycardia and weakness. She is one week postoperative following laparoscopic R colectomy to remove mesenteric mass. She had a ureteral stent placed due to hydronephrosis. She did have anemia following the surgery, but did not required transfusion. At home she reports feeling bloating and gas in her abdomen but no significant pain. She does feel weak more weak today. Her last BM was yesterday and she is passing some gas. She does endorse dysuria and a feeling of pressure when she goes to urinate. She had a temperature to 101 today. She did not check her temperature prior to today but she has been feeling sweaty at times. She denies any chest pain, cough, shortness of breath, nausea, vomiting. Symptoms are moderate   Past Medical History:  Diagnosis Date  . Adnexal mass 03/02/2018   Right 5.1cm  . Gallstones   . GERD (gastroesophageal reflux disease)    occ   . Hay fever   . Hernia, umbilical   . Kidney stones   . Left breast mass   . Renal calculus, right   . UTI (urinary tract infection)     Past Surgical History:  Procedure Laterality Date  . CYSTOSCOPY WITH STENT PLACEMENT Bilateral 04/29/2018   Procedure: CYSTOSCOPY WITH  BILATERAL RETROGRADE PYELOGRAM RIGHT STENT PLACEMENT;  Surgeon: Ardis Hughs, MD;  Location: [facility];  Service: Urology;  Laterality: Bilateral;  . LAPAROSCOPIC REMOVAL ABDOMINAL MASS N/A 04/29/2018   Procedure: LAPAROSCOPIC RIGHT HEMI-COLECTOMY ERAS PATHWAY;  Surgeon: Stark Klein, MD;  Location: [facility];  Service: General;  Laterality: N/A;    Family History  Problem Relation Age of Onset  . Diabetes Mother   . Aneurysm Father   . Diabetes Brother   . Breast cancer Paternal Grandmother   . Breast cancer Other    Social History:  reports that she has never smoked. She has never used smokeless tobacco. She reports  that she does not drink alcohol or use drugs.  Allergies:  Allergies  Allergen Reactions  . Watermelon [Citrullus Vulgaris] Itching    Throat itches.  . Amoxicillin Rash    Has patient had a PCN reaction causing immediate rash, facial/tongue/throat swelling, SOB or lightheadedness with hypotension: No Has patient had a PCN reaction causing severe rash involving mucus membranes or skin necrosis: No Has patient had a PCN reaction that required hospitalization: No Has patient had a PCN reaction occurring within the last 10 years: Yes--very mild rash If all of the above answers are "NO", then may proceed with Cephalosporin use.      (Not in a hospital admission)  Results for orders placed or performed during the hospital encounter of 05/06/18 (from the past 48 hour(s))  Type and screen [place]     Status: None   Collection Time: 05/06/18  8:50 PM  Result Value Ref Range   ABO/RH(D) O NEG    Antibody Screen NEG    Sample Expiration      05/09/2018 Performed at [facility], [place] [address], [place], [place] [address]   Comprehensive metabolic panel     Status: Abnormal   Collection Time: 05/06/18  9:04 PM  Result Value Ref Range   Sodium 132 (L) 135 - 145 mmol/L   Potassium 4.3 3.5 - 5.1 mmol/L   Chloride 91 (L) 98 - 111 mmol/L   CO2 25 22 -  32 mmol/L   Glucose, Bld 167 (H) 70 - 99 mg/dL   BUN 22 (H) 6 - 20 mg/dL   Creatinine, Ser 0.64 0.44 - 1.00 mg/dL   Calcium 9.4 8.9 - 10.3 mg/dL   Total Protein 6.7 6.5 - 8.1 g/dL   Albumin 2.5 (L) 3.5 - 5.0 g/dL   AST 26 15 - 41 U/L   ALT 18 0 - 44 U/L   Alkaline Phosphatase 76 38 - 126 U/L   Total Bilirubin 1.0 0.3 - 1.2 mg/dL   GFR calc non Af Amer >60 >60 mL/min   GFR calc Af Amer >60 >60 mL/min    Comment: (NOTE) The eGFR has been calculated using the CKD EPI equation. This calculation has not been validated in all clinical situations. eGFR's persistently <60 mL/min signify possible  Chronic Kidney Disease.    Anion gap 16 (H) 5 - 15    Comment: Performed at [facility], [place] [address], [place], [place] [address]  CBC WITH DIFFERENTIAL     Status: Abnormal   Collection Time: 05/06/18  9:04 PM  Result Value Ref Range   WBC 22.7 (H) 4.0 - 10.5 K/uL   RBC 3.17 (L) 3.87 - 5.11 MIL/uL   Hemoglobin 9.5 (L) 12.0 - 15.0 g/dL   HCT 27.5 (L) 36.0 - 46.0 %   MCV 86.8 78.0 - 100.0 fL   MCH 30.0 26.0 - 34.0 pg   MCHC 34.5 30.0 - 36.0 g/dL   RDW 13.8 11.5 - 15.5 %   Platelets 728 (H) 150 - 400 K/uL   Neutrophils Relative % 87 %   Neutro Abs 19.8 (H) 1.7 - 7.7 K/uL   Lymphocytes Relative 4 %   Lymphs Abs 0.8 0.7 - 4.0 K/uL   Monocytes Relative 9 %   Monocytes Absolute 2.1 (H) 0.1 - 1.0 K/uL   Eosinophils Relative 0 %   Eosinophils Absolute 0.0 0.0 - 0.7 K/uL   Basophils Relative 0 %   Basophils Absolute 0.0 0.0 - 0.1 K/uL    Comment: Performed at [facility], [place] [address] [place], [place] [address]  I-stat troponin, ED     Status: None   Collection Time: 05/06/18  9:05 PM  Result Value Ref Range   Troponin i, poc 0.00 0.00 - 0.08 ng/mL   Comment 3            Comment: Due to the release kinetics of cTnI, a negative result within the first hours of the onset of symptoms does not rule out myocardial infarction with certainty. If myocardial infarction is still suspected, repeat the test at appropriate intervals.   I-Stat beta hCG blood, ED     Status: Abnormal   Collection Time: 05/06/18  9:06 PM  Result Value Ref Range   I-stat hCG, quantitative 8.6 (H) <5 mIU/mL   Comment 3            Comment:   GEST. AGE      CONC.  (mIU/mL)   <=1 WEEK        5 - 50     2 WEEKS       50 - 500     3 WEEKS       100 - 10,000     4 WEEKS     1,000 - 30,000        FEMALE AND NON-PREGNANT FEMALE:     LESS THAN 5 mIU/mL   I-stat Chem 8, ED  Status: Abnormal   Collection Time: 05/06/18  9:12 PM  Result Value Ref Range   Sodium 126 (L)  135 - 145 mmol/L   Potassium 4.2 3.5 - 5.1 mmol/L   Chloride 88 (L) 98 - 111 mmol/L   BUN 22 (H) 6 - 20 mg/dL   Creatinine, Ser 0.70 0.44 - 1.00 mg/dL   Glucose, Bld 171 (H) 70 - 99 mg/dL   Calcium, Ion 1.11 (L) 1.15 - 1.40 mmol/L   TCO2 29 22 - 32 mmol/L   Hemoglobin 9.9 (L) 12.0 - 15.0 g/dL   HCT 29.0 (L) 36.0 - 46.0 %  I-Stat CG4 Lactic Acid, ED  (not at  [facility])     Status: None   Collection Time: 05/06/18  9:16 PM  Result Value Ref Range   Lactic Acid, Venous 1.58 0.5 - 1.9 mmol/L  Urinalysis, Routine w reflex microscopic     Status: Abnormal   Collection Time: 05/06/18  9:29 PM  Result Value Ref Range   Color, Urine YELLOW YELLOW   APPearance CLEAR CLEAR   Specific Gravity, Urine 1.026 1.005 - 1.030   pH 6.0 5.0 - 8.0   Glucose, UA NEGATIVE NEGATIVE mg/dL   Hgb urine dipstick MODERATE (A) NEGATIVE   Bilirubin Urine NEGATIVE NEGATIVE   Ketones, ur 5 (A) NEGATIVE mg/dL   Protein, ur 30 (A) NEGATIVE mg/dL   Nitrite NEGATIVE NEGATIVE   Leukocytes, UA TRACE (A) NEGATIVE   RBC / HPF 21-50 0 - 5 RBC/hpf   WBC, UA 6-10 0 - 5 WBC/hpf   Bacteria, UA RARE (A) NONE SEEN   Squamous Epithelial / LPF 0-5 0 - 5   Mucus PRESENT    Ca Oxalate Crys, UA PRESENT     Comment: Performed at [facility], [place] [address] [place], [place] [address]   Ct Angio Chest Pe W/cm &/or Wo Cm  Result Date: 05/06/2018 CLINICAL DATA:  One week postop from laparoscopic hernia repair. Lots of gas and discomfort. Bloating, tachycardia, and fever. Concern about heart rate. EXAM: CT ANGIOGRAPHY CHEST CT ABDOMEN AND PELVIS WITH CONTRAST TECHNIQUE: Multidetector CT imaging of the chest was performed using the standard protocol during bolus administration of intravenous contrast. Multiplanar CT image reconstructions and MIPs were obtained to evaluate the vascular anatomy. Multidetector CT imaging of the abdomen and pelvis was performed using the standard protocol during bolus administration of  intravenous contrast. CONTRAST:  185m ISOVUE-370 IOPAMIDOL (ISOVUE-370) INJECTION 76% COMPARISON:  CT abdomen and pelvis 02/28/2018 FINDINGS: CTA CHEST FINDINGS Cardiovascular: Examination is somewhat limited due to motion artifact. There is good opacification of the central and segmental pulmonary arteries. No focal filling defects demonstrated. No evidence of significant pulmonary embolus. Normal caliber thoracic aorta. No aortic dissection. Great vessel origins are patent. Normal heart size. No pericardial effusions. Mediastinum/Nodes: Esophagus is decompressed. Probable small esophageal hiatal hernia. No significant lymphadenopathy in the chest. Lungs/Pleura: Evaluation is limited by motion artifact. No focal consolidation and no airspace disease identified. No pleural effusions. No pneumothorax. Musculoskeletal: No chest wall abnormality. No acute or significant osseous findings. Review of the MIP images confirms the above findings. CT ABDOMEN and PELVIS FINDINGS Hepatobiliary: Large stones in the gallbladder. No wall thickening or inflammatory changes. No bile duct dilatation. No focal liver lesions. Pancreas: Unremarkable. No pancreatic ductal dilatation or surrounding inflammatory changes. Spleen: Normal in size without focal abnormality. Adrenals/Urinary Tract: No adrenal gland nodules. Renal nephrograms are homogeneous and normal. Right ureteral stent with proximal pigtail in the right  renal pelvis and distal pigtail in the bladder. There is some residual hydronephrosis and hydroureter which may indicate poor stent function. Left kidney and ureter are unremarkable. Small amount of gas in the bladder without bladder wall thickening. This may be due to previous instrumentation. Stomach/Bowel: Gas and fluid in the stomach without abnormal distention or wall thickening. Dilated fluid-filled small bowel with distal decompression. Appearance suspicious for small bowel obstruction. Ileus less likely due to  change in caliber. Scattered stool in the colon without significant colonic distention. Anastomosis in the right colon. No definite bowel wall or colonic wall thickening. Vascular/Lymphatic: No significant vascular findings are present. No enlarged abdominal or pelvic lymph nodes. Reproductive: Uterus and ovaries are not enlarged. Other: Since the previous study, there has been interval removal of the partially calcified soft tissue mass seen previously in the right lower quadrant. There is interval presence of diffuse free intra-abdominal air. This is likely residual postoperative gas but follow-up is suggested to exclude a leak. Several loculated fluid collections are demonstrated in the right lower quadrant and pelvis. Largest is in the posterior pelvis posterior to the uterus and measuring 9.9 x 10.2 cm. Fluid collection anterior to the uterus measures up to about 8.1 cm diameter. Less well defined fluid collection in the right lower quadrant posterior to the cecum in anastomosis measuring about 6.4 cm in diameter. Fluid collections have increased density measurements. There is a small amount of diffuse free fluid throughout the abdomen as well. While these could represent postoperative fluid collections, the appearance is worrisome for multiple abscesses. Seromas or hematomas less likely. Musculoskeletal: No acute or significant osseous findings. Review of the MIP images confirms the above findings. IMPRESSION: CTA CHEST: No evidence of significant pulmonary embolus. No evidence of active pulmonary disease. CT ABDOMEN AND PELVIS: 1. Multiple loculated fluid collections throughout the abdomen and pelvis with well-defined low-density components. These are worrisome for postoperative abscesses. Hematoma/seroma less likely. 2. Diffuse free intraperitoneal air and free fluid, likely postoperative. Consider follow-up to exclude bowel leak. 3. Fluid-filled dilated small bowel loops with distal decompression likely  indicating small bowel obstruction. Ileus less likely. No abnormal bowel wall thickening or pneumatosis. 4. Right ureteral stent with proximal pigtail in the right renal pelvis and distal pigtail in the bladder. Residual hydronephrosis and hydroureter may indicate poor stent function. Small amount of gas in the bladder may be due to previous instrumentation. Electronically Signed   By: Lucienne Capers M.D.   On: 05/06/2018 23:49   Ct Abdomen Pelvis W Contrast  Result Date: 05/06/2018 CLINICAL DATA:  One week postop from laparoscopic hernia repair. Lots of gas and discomfort. Bloating, tachycardia, and fever. Concern about heart rate. EXAM: CT ANGIOGRAPHY CHEST CT ABDOMEN AND PELVIS WITH CONTRAST TECHNIQUE: Multidetector CT imaging of the chest was performed using the standard protocol during bolus administration of intravenous contrast. Multiplanar CT image reconstructions and MIPs were obtained to evaluate the vascular anatomy. Multidetector CT imaging of the abdomen and pelvis was performed using the standard protocol during bolus administration of intravenous contrast. CONTRAST:  174m ISOVUE-370 IOPAMIDOL (ISOVUE-370) INJECTION 76% COMPARISON:  CT abdomen and pelvis 02/28/2018 FINDINGS: CTA CHEST FINDINGS Cardiovascular: Examination is somewhat limited due to motion artifact. There is good opacification of the central and segmental pulmonary arteries. No focal filling defects demonstrated. No evidence of significant pulmonary embolus. Normal caliber thoracic aorta. No aortic dissection. Great vessel origins are patent. Normal heart size. No pericardial effusions. Mediastinum/Nodes: Esophagus is decompressed. Probable small esophageal hiatal hernia. No  significant lymphadenopathy in the chest. Lungs/Pleura: Evaluation is limited by motion artifact. No focal consolidation and no airspace disease identified. No pleural effusions. No pneumothorax. Musculoskeletal: No chest wall abnormality. No acute or  significant osseous findings. Review of the MIP images confirms the above findings. CT ABDOMEN and PELVIS FINDINGS Hepatobiliary: Large stones in the gallbladder. No wall thickening or inflammatory changes. No bile duct dilatation. No focal liver lesions. Pancreas: Unremarkable. No pancreatic ductal dilatation or surrounding inflammatory changes. Spleen: Normal in size without focal abnormality. Adrenals/Urinary Tract: No adrenal gland nodules. Renal nephrograms are homogeneous and normal. Right ureteral stent with proximal pigtail in the right renal pelvis and distal pigtail in the bladder. There is some residual hydronephrosis and hydroureter which may indicate poor stent function. Left kidney and ureter are unremarkable. Small amount of gas in the bladder without bladder wall thickening. This may be due to previous instrumentation. Stomach/Bowel: Gas and fluid in the stomach without abnormal distention or wall thickening. Dilated fluid-filled small bowel with distal decompression. Appearance suspicious for small bowel obstruction. Ileus less likely due to change in caliber. Scattered stool in the colon without significant colonic distention. Anastomosis in the right colon. No definite bowel wall or colonic wall thickening. Vascular/Lymphatic: No significant vascular findings are present. No enlarged abdominal or pelvic lymph nodes. Reproductive: Uterus and ovaries are not enlarged. Other: Since the previous study, there has been interval removal of the partially calcified soft tissue mass seen previously in the right lower quadrant. There is interval presence of diffuse free intra-abdominal air. This is likely residual postoperative gas but follow-up is suggested to exclude a leak. Several loculated fluid collections are demonstrated in the right lower quadrant and pelvis. Largest is in the posterior pelvis posterior to the uterus and measuring 9.9 x 10.2 cm. Fluid collection anterior to the uterus measures up to  about 8.1 cm diameter. Less well defined fluid collection in the right lower quadrant posterior to the cecum in anastomosis measuring about 6.4 cm in diameter. Fluid collections have increased density measurements. There is a small amount of diffuse free fluid throughout the abdomen as well. While these could represent postoperative fluid collections, the appearance is worrisome for multiple abscesses. Seromas or hematomas less likely. Musculoskeletal: No acute or significant osseous findings. Review of the MIP images confirms the above findings. IMPRESSION: CTA CHEST: No evidence of significant pulmonary embolus. No evidence of active pulmonary disease. CT ABDOMEN AND PELVIS: 1. Multiple loculated fluid collections throughout the abdomen and pelvis with well-defined low-density components. These are worrisome for postoperative abscesses. Hematoma/seroma less likely. 2. Diffuse free intraperitoneal air and free fluid, likely postoperative. Consider follow-up to exclude bowel leak. 3. Fluid-filled dilated small bowel loops with distal decompression likely indicating small bowel obstruction. Ileus less likely. No abnormal bowel wall thickening or pneumatosis. 4. Right ureteral stent with proximal pigtail in the right renal pelvis and distal pigtail in the bladder. Residual hydronephrosis and hydroureter may indicate poor stent function. Small amount of gas in the bladder may be due to previous instrumentation. Electronically Signed   By: Lucienne Capers M.D.   On: 05/06/2018 23:49    Review of Systems  Constitutional: Positive for chills and fever.  Respiratory: Negative for cough and shortness of breath.   Cardiovascular: Positive for palpitations. Negative for chest pain.  Gastrointestinal: Negative for abdominal pain, nausea and vomiting.  Genitourinary: Positive for dysuria. Negative for hematuria and urgency.  Skin: Negative for itching and rash.  Neurological: Negative for dizziness and headaches.  Blood pressure 121/83, pulse (!) 128, temperature (!) 100.5 F (38.1 C), temperature source Rectal, resp. rate 18, height '5\' 4"'  (1.626 m), weight 59 kg, last menstrual period 04/29/2018, SpO2 97 %. Physical Exam  Constitutional: She is oriented to person, place, and time. She appears well-developed and well-nourished.  HENT:  Head: Normocephalic and atraumatic.  Eyes: Pupils are equal, round, and reactive to light. Conjunctivae and EOM are normal.  Neck: Normal range of motion.  Cardiovascular: Normal rate and regular rhythm.  Respiratory: Effort normal. No respiratory distress.  GI: Soft. She exhibits no distension. There is tenderness (RLQ). There is no rebound and no guarding.  Neurological: She is alert and oriented to person, place, and time.     Assessment/Plan 42 y.o. F 1 wk s/p R colectomy.  CT with multiple fluid collections.  Given wbc and fevers, I believe she may have an abscess forming in her RLQ.  I do not see any evidence of a direct leak, but contrast did not make it through to her colon very well.    Admit to floor.  IVF's.  IV abx.  Possible IR drain in AM.  NPO.  Leighton Ruff C., MD 7/56/4332, 1:00 AM

## 2018-05-07 NOTE — Progress Notes (Signed)
Initial Nutrition Assessment  INTERVENTION:   Diet advancement per MD Once diet advanced, Provide Ensure Enlive po BID, each supplement provides 350 kcal and 20 grams of protein  NUTRITION DIAGNOSIS:   Increased nutrient needs related to post-op healing as evidenced by estimated needs.  GOAL:   Patient will meet greater than or equal to 90% of their needs  MONITOR:   Diet advancement, Labs, Weight trends, I & O's  REASON FOR ASSESSMENT:   Malnutrition Screening Tool    ASSESSMENT:   42 y.o. female with a past medical history of GERD, umbilical hernia, cholelithiasis, nephrolithiasis, cystitis, adnexal mass, left breast mass, and hay fever. She was found to have an adnexal mass that was obstructing her right ureter and underwent a diagnostic laparoscopy with Dr. Dorris Fetch and cystoscopy with right ureteral stent placement with Dr. Louis Meckel on 04/29/2018. She was then discharged home 05/03/2018. She presented to ED 05/06/2018 with complaints of tachycardia and weakness. She was found to have a postprocedural intraabdominal abscess.  Patient currently NPO for pending drain placement via IR. Pt is s/p lap colectomy 9/11. Initially following surgery pt was tolerating diet and eating ~75-80% of a soft diet. Pt was discharged 9/15. At home was not eating much. Recommend supplements once diet is advanced.  Per weight records, pt has lost 17 lb since 6/25 (12% wt loss x 3 months, significant for time frame).  Medications: D5 and .45% NaCl w/ Kcl infusion at 125 ml/hr Labs reviewed: Low Na   NUTRITION - FOCUSED PHYSICAL EXAM:  Nutrition focused physical exam shows no sign of depletion of muscle mass or body fat.  Diet Order:   Diet Order            Diet NPO time specified Except for: Ice Chips  Diet effective now              EDUCATION NEEDS:   Not appropriate for education at this time  Skin:  Skin Assessment: Reviewed RN Assessment  Last BM:  9/18  Height:   Ht Readings  from Last 1 Encounters:  05/06/18 5\' 4"  (1.626 m)    Weight:   Wt Readings from Last 1 Encounters:  05/06/18 59 kg    Ideal Body Weight:  54.5 kg  BMI:  Body mass index is 22.31 kg/m.  Estimated Nutritional Needs:   Kcal:  1500-1700  Protein:  70-80g  Fluid:  1.7L/day  Clayton Bibles, MS, RD, LDN [facility] Diet[facility] Pager: [phone] After Hours Pager: [phone]

## 2018-05-08 LAB — BASIC METABOLIC PANEL
Anion gap: 8 (ref 5–15)
BUN: 11 mg/dL (ref 6–20)
CALCIUM: 7.4 mg/dL — AB (ref 8.9–10.3)
CHLORIDE: 101 mmol/L (ref 98–111)
CO2: 25 mmol/L (ref 22–32)
CREATININE: 0.37 mg/dL — AB (ref 0.44–1.00)
GFR calc Af Amer: >60 mL/min (ref >60)
GFR calc non Af Amer: >60 mL/min (ref >60)
Glucose, Bld: 131 mg/dL — ABNORMAL HIGH (ref 70–99)
Potassium: 3.9 mmol/L (ref 3.5–5.1)
SODIUM: 134 mmol/L — AB (ref 135–145)

## 2018-05-08 LAB — URINE CULTURE

## 2018-05-08 LAB — CBC
HCT: 21.9 % — ABNORMAL LOW (ref 36.0–46.0)
HEMOGLOBIN: 7.3 g/dL — AB (ref 12.0–15.0)
MCH: 29.9 pg (ref 26.0–34.0)
MCHC: 33.3 g/dL (ref 30.0–36.0)
MCV: 89.8 fL (ref 78.0–100.0)
PLATELETS: 574 10*3/uL — AB (ref 150–400)
RBC: 2.44 MIL/uL — ABNORMAL LOW (ref 3.87–5.11)
RDW: 14.8 % (ref 11.5–15.5)
WBC: 17 10*3/uL — ABNORMAL HIGH (ref 4.0–10.5)

## 2018-05-08 LAB — LACTIC ACID, PLASMA: Lactic Acid, Venous: 1 mmol/L (ref 0.5–1.9)

## 2018-05-08 NOTE — Progress Notes (Signed)
Received call from [facility] regarding patient's MEWS score had increased briefly to a 4.  Great brief report given on pt's condition and recent events.  [place] advised me that the MEWS was elevated due to elevated HR and fever.  Did not receive the impression that pt was in any distress or emergent condition was present at the time.  To clarify intent of the call, I asked [place] if she would like for me to come to the bedside, she advised, no, that she just wanted to notify me of the MEWS score due to the new MEWS protocol.  Chart reviewed, MEWS currently back down to 2.  HR has been elevated 110 to 130s since surgery was done.  Pt currently has a fever of 100 and tylenol has been given.  Did not see a current Lactic Acid level, will add to labs this am per RRT protocol.  No other action from RRT needed at this point.  If patient's condition changes the rapid response nurse is always available, please call us at [phone].  Jacqulyn Ducking ICU/SD Care Coordinator / Rapid Response Nurse Rapid Response Number:  [phone]

## 2018-05-08 NOTE — Progress Notes (Signed)
Referring Physician(s): Byerly,F  Supervising Physician: Arne Cleveland  Patient Status:  [facility] - In-pt  Chief Complaint:  Abdominal-pelvic fluid collections  Subjective: Pt feeling a little better today; does have some soreness at right transgluteal drain site.   Allergies: Watermelon [citrullus vulgaris] and Amoxicillin  Medications: Prior to Admission medications   Medication Sig Start Date End Date Taking? Authorizing Provider  acetaminophen (TYLENOL) 500 MG tablet Take 500-1,000 mg by mouth daily as needed for moderate pain.   Yes [provider]  Ascorbic Acid (VITAMIN C) 1000 MG tablet Take 1,000 mg by mouth daily. w/Elderberry   Yes [provider]  Cholecalciferol (VITAMIN D-3) 5000 units TABS Take 1,500 Units by mouth daily.    Yes [provider]  IRON PO Take 1 tablet by mouth daily.   Yes [provider]  Misc Natural Products (SUPER GREENS) POWD Take 1 Scoop by mouth daily.   Yes [provider]  Probiotic Product (PROBIOTIC PO) Take 1 capsule by mouth daily.   Yes [provider]  traMADol (ULTRAM) 50 MG tablet Take 1-2 tablets (50-100 mg total) by mouth every 6 (six) hours as needed. 05/03/18  Yes Armandina Gemma, MD  Diindolylmethane POWD Take 1 capsule by mouth daily. (DIM)    [provider]  EVENING PRIMROSE OIL PO Take 1,200 mg by mouth daily.    [provider]  nitrofurantoin, macrocrystal-monohydrate, (MACROBID) 100 MG capsule Take 1 capsule (100 mg total) by mouth 2 (two) times daily. 1 po BId Patient not taking: Reported on 05/06/2018 03/21/18   Dettinger, Fransisca Kaufmann, MD  Omega-3 Fatty Acids (FISH OIL OMEGA-3 PO) Take 2 capsules by mouth daily.     [provider]  RESVERATROL PO Take 1 capsule by mouth daily.     [provider]  SODIUM BICARBONATE PO Take 2 capsules by mouth at bedtime.    [provider]     Vital Signs: BP (!) 116/59 (BP Location: Right  Arm)   Pulse (!) 105   Temp 98.6 F (37 C) (Oral)   Resp 18   Ht 5\' 4"  (1.626 m)   Wt 130 lb (59 kg)   LMP 04/29/2018   SpO2 100%   BMI 22.31 kg/m   Physical Exam right transgluteal drain intact, insertion site mildly tender, output 80 cc blood-tinged fluid  Imaging: Ct Angio Chest Pe W/cm &/or Wo Cm  Result Date: 05/06/2018 CLINICAL DATA:  One week postop from laparoscopic hernia repair. Lots of gas and discomfort. Bloating, tachycardia, and fever. Concern about heart rate. EXAM: CT ANGIOGRAPHY CHEST CT ABDOMEN AND PELVIS WITH CONTRAST TECHNIQUE: Multidetector CT imaging of the chest was performed using the standard protocol during bolus administration of intravenous contrast. Multiplanar CT image reconstructions and MIPs were obtained to evaluate the vascular anatomy. Multidetector CT imaging of the abdomen and pelvis was performed using the standard protocol during bolus administration of intravenous contrast. CONTRAST:  123mL ISOVUE-370 IOPAMIDOL (ISOVUE-370) INJECTION 76% COMPARISON:  CT abdomen and pelvis 02/28/2018 FINDINGS: CTA CHEST FINDINGS Cardiovascular: Examination is somewhat limited due to motion artifact. There is good opacification of the central and segmental pulmonary arteries. No focal filling defects demonstrated. No evidence of significant pulmonary embolus. Normal caliber thoracic aorta. No aortic dissection. Great vessel origins are patent. Normal heart size. No pericardial effusions. Mediastinum/Nodes: Esophagus is decompressed. Probable small esophageal hiatal hernia. No significant lymphadenopathy in the chest. Lungs/Pleura: Evaluation is limited by motion artifact. No focal consolidation and no airspace disease  identified. No pleural effusions. No pneumothorax. Musculoskeletal: No chest wall abnormality. No acute or significant osseous findings. Review of the MIP images confirms the above findings. CT ABDOMEN and PELVIS FINDINGS Hepatobiliary: Large stones in the  gallbladder. No wall thickening or inflammatory changes. No bile duct dilatation. No focal liver lesions. Pancreas: Unremarkable. No pancreatic ductal dilatation or surrounding inflammatory changes. Spleen: Normal in size without focal abnormality. Adrenals/Urinary Tract: No adrenal gland nodules. Renal nephrograms are homogeneous and normal. Right ureteral stent with proximal pigtail in the right renal pelvis and distal pigtail in the bladder. There is some residual hydronephrosis and hydroureter which may indicate poor stent function. Left kidney and ureter are unremarkable. Small amount of gas in the bladder without bladder wall thickening. This may be due to previous instrumentation. Stomach/Bowel: Gas and fluid in the stomach without abnormal distention or wall thickening. Dilated fluid-filled small bowel with distal decompression. Appearance suspicious for small bowel obstruction. Ileus less likely due to change in caliber. Scattered stool in the colon without significant colonic distention. Anastomosis in the right colon. No definite bowel wall or colonic wall thickening. Vascular/Lymphatic: No significant vascular findings are present. No enlarged abdominal or pelvic lymph nodes. Reproductive: Uterus and ovaries are not enlarged. Other: Since the previous study, there has been interval removal of the partially calcified soft tissue mass seen previously in the right lower quadrant. There is interval presence of diffuse free intra-abdominal air. This is likely residual postoperative gas but follow-up is suggested to exclude a leak. Several loculated fluid collections are demonstrated in the right lower quadrant and pelvis. Largest is in the posterior pelvis posterior to the uterus and measuring 9.9 x 10.2 cm. Fluid collection anterior to the uterus measures up to about 8.1 cm diameter. Less well defined fluid collection in the right lower quadrant posterior to the cecum in anastomosis measuring about 6.4 cm in  diameter. Fluid collections have increased density measurements. There is a small amount of diffuse free fluid throughout the abdomen as well. While these could represent postoperative fluid collections, the appearance is worrisome for multiple abscesses. Seromas or hematomas less likely. Musculoskeletal: No acute or significant osseous findings. Review of the MIP images confirms the above findings. IMPRESSION: CTA CHEST: No evidence of significant pulmonary embolus. No evidence of active pulmonary disease. CT ABDOMEN AND PELVIS: 1. Multiple loculated fluid collections throughout the abdomen and pelvis with well-defined low-density components. These are worrisome for postoperative abscesses. Hematoma/seroma less likely. 2. Diffuse free intraperitoneal air and free fluid, likely postoperative. Consider follow-up to exclude bowel leak. 3. Fluid-filled dilated small bowel loops with distal decompression likely indicating small bowel obstruction. Ileus less likely. No abnormal bowel wall thickening or pneumatosis. 4. Right ureteral stent with proximal pigtail in the right renal pelvis and distal pigtail in the bladder. Residual hydronephrosis and hydroureter may indicate poor stent function. Small amount of gas in the bladder may be due to previous instrumentation. Electronically Signed   By: Lucienne Capers M.D.   On: 05/06/2018 23:49   Ct Abdomen Pelvis W Contrast  Result Date: 05/06/2018 CLINICAL DATA:  One week postop from laparoscopic hernia repair. Lots of gas and discomfort. Bloating, tachycardia, and fever. Concern about heart rate. EXAM: CT ANGIOGRAPHY CHEST CT ABDOMEN AND PELVIS WITH CONTRAST TECHNIQUE: Multidetector CT imaging of the chest was performed using the standard protocol during bolus administration of intravenous contrast. Multiplanar CT image reconstructions and MIPs were obtained to evaluate the vascular anatomy. Multidetector CT imaging of the abdomen and pelvis was  performed using the  standard protocol during bolus administration of intravenous contrast. CONTRAST:  165mL ISOVUE-370 IOPAMIDOL (ISOVUE-370) INJECTION 76% COMPARISON:  CT abdomen and pelvis 02/28/2018 FINDINGS: CTA CHEST FINDINGS Cardiovascular: Examination is somewhat limited due to motion artifact. There is good opacification of the central and segmental pulmonary arteries. No focal filling defects demonstrated. No evidence of significant pulmonary embolus. Normal caliber thoracic aorta. No aortic dissection. Great vessel origins are patent. Normal heart size. No pericardial effusions. Mediastinum/Nodes: Esophagus is decompressed. Probable small esophageal hiatal hernia. No significant lymphadenopathy in the chest. Lungs/Pleura: Evaluation is limited by motion artifact. No focal consolidation and no airspace disease identified. No pleural effusions. No pneumothorax. Musculoskeletal: No chest wall abnormality. No acute or significant osseous findings. Review of the MIP images confirms the above findings. CT ABDOMEN and PELVIS FINDINGS Hepatobiliary: Large stones in the gallbladder. No wall thickening or inflammatory changes. No bile duct dilatation. No focal liver lesions. Pancreas: Unremarkable. No pancreatic ductal dilatation or surrounding inflammatory changes. Spleen: Normal in size without focal abnormality. Adrenals/Urinary Tract: No adrenal gland nodules. Renal nephrograms are homogeneous and normal. Right ureteral stent with proximal pigtail in the right renal pelvis and distal pigtail in the bladder. There is some residual hydronephrosis and hydroureter which may indicate poor stent function. Left kidney and ureter are unremarkable. Small amount of gas in the bladder without bladder wall thickening. This may be due to previous instrumentation. Stomach/Bowel: Gas and fluid in the stomach without abnormal distention or wall thickening. Dilated fluid-filled small bowel with distal decompression. Appearance suspicious for small  bowel obstruction. Ileus less likely due to change in caliber. Scattered stool in the colon without significant colonic distention. Anastomosis in the right colon. No definite bowel wall or colonic wall thickening. Vascular/Lymphatic: No significant vascular findings are present. No enlarged abdominal or pelvic lymph nodes. Reproductive: Uterus and ovaries are not enlarged. Other: Since the previous study, there has been interval removal of the partially calcified soft tissue mass seen previously in the right lower quadrant. There is interval presence of diffuse free intra-abdominal air. This is likely residual postoperative gas but follow-up is suggested to exclude a leak. Several loculated fluid collections are demonstrated in the right lower quadrant and pelvis. Largest is in the posterior pelvis posterior to the uterus and measuring 9.9 x 10.2 cm. Fluid collection anterior to the uterus measures up to about 8.1 cm diameter. Less well defined fluid collection in the right lower quadrant posterior to the cecum in anastomosis measuring about 6.4 cm in diameter. Fluid collections have increased density measurements. There is a small amount of diffuse free fluid throughout the abdomen as well. While these could represent postoperative fluid collections, the appearance is worrisome for multiple abscesses. Seromas or hematomas less likely. Musculoskeletal: No acute or significant osseous findings. Review of the MIP images confirms the above findings. IMPRESSION: CTA CHEST: No evidence of significant pulmonary embolus. No evidence of active pulmonary disease. CT ABDOMEN AND PELVIS: 1. Multiple loculated fluid collections throughout the abdomen and pelvis with well-defined low-density components. These are worrisome for postoperative abscesses. Hematoma/seroma less likely. 2. Diffuse free intraperitoneal air and free fluid, likely postoperative. Consider follow-up to exclude bowel leak. 3. Fluid-filled dilated small  bowel loops with distal decompression likely indicating small bowel obstruction. Ileus less likely. No abnormal bowel wall thickening or pneumatosis. 4. Right ureteral stent with proximal pigtail in the right renal pelvis and distal pigtail in the bladder. Residual hydronephrosis and hydroureter may indicate poor stent function. Small amount of  gas in the bladder may be due to previous instrumentation. Electronically Signed   By: Lucienne Capers M.D.   On: 05/06/2018 23:49   Ct Image Guided Drainage By Percutaneous Catheter  Result Date: 05/07/2018 INDICATION: 42 year old female with a history of pelvic fluid collection. EXAM: CT-GUIDED DRAINAGE OF PELVIC FLUID MEDICATIONS: The patient is currently admitted to the hospital and receiving intravenous antibiotics. The antibiotics were administered within an appropriate time frame prior to the initiation of the procedure. ANESTHESIA/SEDATION: 4.0 mg IV Versed 100 mcg IV Fentanyl Moderate Sedation Time:  10 minutes The patient was continuously monitored during the procedure by the interventional radiology nurse under my direct supervision. COMPLICATIONS: None TECHNIQUE: Informed written consent was obtained from the patient after a thorough discussion of the procedural risks, benefits and alternatives. All questions were addressed. Maximal Sterile Barrier Technique was utilized including caps, mask, sterile gowns, sterile gloves, sterile drape, hand hygiene and skin antiseptic. A timeout was performed prior to the initiation of the procedure. PROCEDURE: The right gluteal region was prepped with chlorhexidine in a sterile fashion, and a sterile drape was applied covering the operative field. A sterile gown and sterile gloves were used for the procedure. Local anesthesia was provided with 1% Lidocaine. Using CT guidance, modified Seldinger technique was used to place a right transgluteal 12 [place] drain. Approximately 350 cc of dark bloody fluid aspirated. Catheter  was sutured in position attached to bulb suction. Sample sent for analysis. Patient tolerated the procedure well and remained hemodynamically stable throughout. No complications were encountered and no significant blood loss. FINDINGS: Abscess of the pelvis treated with right trans gluteal drain. IMPRESSION: Status post right transgluteal 12 French drainage of pelvic fluid collection. Sample sent for culture. Signed, Dulcy Fanny. Dellia Nims, RPVI [facility] Radiology Electronically Signed   By: Corrie Mckusick D.O.   On: 05/07/2018 18:00    Labs:  CBC: Recent Labs    05/03/18 0356 05/06/18 2104 05/06/18 2112 05/07/18 0434 05/08/18 0509  WBC 8.6 22.7*  --  21.8* 17.0*  HGB 7.4* 9.5* 9.9* 8.4* 7.3*  HCT 22.5* 27.5* 29.0* 24.3* 21.9*  PLT 259 728*  --  654* 574*    COAGS: Recent Labs    05/07/18 1338  INR 1.08    BMP: Recent Labs    05/03/18 0356 05/06/18 2104 05/06/18 2112 05/07/18 0434 05/08/18 0509  NA 142 132* 126* 134* 134*  K 3.6 4.3 4.2 4.2 3.9  CL 106 91* 88* 97* 101  CO2 27 25  --  26 25  GLUCOSE 128* 167* 171* 138* 131*  BUN 15 22* 22* 19 11  CALCIUM 8.8* 9.4  --  8.4* 7.4*  CREATININE 0.50 0.64 0.70 0.52 0.37*  GFRNONAA >60 >60  --  >60 >60  GFRAA >60 >60  --  >60 >60    LIVER FUNCTION TESTS: Recent Labs    02/10/18 1037 05/06/18 2104  BILITOT 0.3 1.0  AST 16 26  ALT 14 18  ALKPHOS 68 76  PROT 7.6 6.7  ALBUMIN 4.4 2.5*    Assessment and Plan: Pt s/p right ureteral stent, laparoscopic partial colectomy- R hemicolectomy, repair of umbilical hernia 1/77/9390; path with inflamm myofibroblastic tumor; postop abdominal /pelvic fluid collections, status post right transgluteal drain placement 9/19; afebrile, creatinine normal, WBC 17 down from 21.8, hemoglobin 7.3 down from 8.4.  Drain fluid cultures pending.  Continue drain irrigation and output monitoring.  Recheck follow-up CT within 1  week.   Electronically  Signed: D. Rowe Robert, PA-C 05/08/2018, 3:23 PM   I spent a total of 15 minutes at the the patient's bedside AND on the patient's hospital floor or unit, greater than 50% of which was counseling/coordinating care for pelvic fluid collection drain    Patient ID: Erica Ross, female   DOB: 1976/07/04, 42 y.o.   MRN: [id number]

## 2018-05-08 NOTE — Progress Notes (Signed)
Subjective/Chief Complaint: Feeling quite a bit better with drain, but sore.    Objective: Vital signs in last 24 hours: Temp:  [98.2 F (36.8 C)-100 F (37.8 C)] 98.2 F (36.8 C) (09/20 0533) Pulse Rate:  [107-137] 107 (09/20 0910) Resp:  [10-21] 18 (09/20 0600) BP: (105-134)/(64-84) 123/74 (09/20 0533) SpO2:  [97 %-100 %] 99 % (09/20 0533) Last BM Date: 05/07/18  Intake/Output from previous day: 09/19 0701 - 09/20 0700 In: 3433.7 [P.O.:200; I.V.:2728.7; IV Piggyback:499.9] Out: 470 [Urine:400; Drains:70] Intake/Output this shift: No intake/output data recorded.  General appearance: alert, cooperative and mild distress Resp: breathing comfortably GI: soft, less distended, approp tender. Extremities: extremities normal, atraumatic, no cyanosis or edema Drain looks like old blood  Lab Results:  Recent Labs    05/07/18 0434 05/08/18 0509  WBC 21.8* 17.0*  HGB 8.4* 7.3*  HCT 24.3* 21.9*  PLT 654* 574*   BMET Recent Labs    05/07/18 0434 05/08/18 0509  NA 134* 134*  K 4.2 3.9  CL 97* 101  CO2 26 25  GLUCOSE 138* 131*  BUN 19 11  CREATININE 0.52 0.37*  CALCIUM 8.4* 7.4*   PT/INR Recent Labs    05/07/18 1338  LABPROT 13.9  INR 1.08   ABG No results for input(s): PHART, HCO3 in the last 72 hours.  Invalid input(s): PCO2, PO2  Studies/Results: Ct Angio Chest Pe W/cm &/or Wo Cm  Result Date: 05/06/2018 CLINICAL DATA:  One week postop from laparoscopic hernia repair. Lots of gas and discomfort. Bloating, tachycardia, and fever. Concern about heart rate. EXAM: CT ANGIOGRAPHY CHEST CT ABDOMEN AND PELVIS WITH CONTRAST TECHNIQUE: Multidetector CT imaging of the chest was performed using the standard protocol during bolus administration of intravenous contrast. Multiplanar CT image reconstructions and MIPs were obtained to evaluate the vascular anatomy. Multidetector CT imaging of the abdomen and pelvis was performed using the standard protocol during bolus  administration of intravenous contrast. CONTRAST:  197mL ISOVUE-370 IOPAMIDOL (ISOVUE-370) INJECTION 76% COMPARISON:  CT abdomen and pelvis 02/28/2018 FINDINGS: CTA CHEST FINDINGS Cardiovascular: Examination is somewhat limited due to motion artifact. There is good opacification of the central and segmental pulmonary arteries. No focal filling defects demonstrated. No evidence of significant pulmonary embolus. Normal caliber thoracic aorta. No aortic dissection. Great vessel origins are patent. Normal heart size. No pericardial effusions. Mediastinum/Nodes: Esophagus is decompressed. Probable small esophageal hiatal hernia. No significant lymphadenopathy in the chest. Lungs/Pleura: Evaluation is limited by motion artifact. No focal consolidation and no airspace disease identified. No pleural effusions. No pneumothorax. Musculoskeletal: No chest wall abnormality. No acute or significant osseous findings. Review of the MIP images confirms the above findings. CT ABDOMEN and PELVIS FINDINGS Hepatobiliary: Large stones in the gallbladder. No wall thickening or inflammatory changes. No bile duct dilatation. No focal liver lesions. Pancreas: Unremarkable. No pancreatic ductal dilatation or surrounding inflammatory changes. Spleen: Normal in size without focal abnormality. Adrenals/Urinary Tract: No adrenal gland nodules. Renal nephrograms are homogeneous and normal. Right ureteral stent with proximal pigtail in the right renal pelvis and distal pigtail in the bladder. There is some residual hydronephrosis and hydroureter which may indicate poor stent function. Left kidney and ureter are unremarkable. Small amount of gas in the bladder without bladder wall thickening. This may be due to previous instrumentation. Stomach/Bowel: Gas and fluid in the stomach without abnormal distention or wall thickening. Dilated fluid-filled small bowel with distal decompression. Appearance suspicious for small bowel obstruction. Ileus less  likely due to change in caliber. Scattered  stool in the colon without significant colonic distention. Anastomosis in the right colon. No definite bowel wall or colonic wall thickening. Vascular/Lymphatic: No significant vascular findings are present. No enlarged abdominal or pelvic lymph nodes. Reproductive: Uterus and ovaries are not enlarged. Other: Since the previous study, there has been interval removal of the partially calcified soft tissue mass seen previously in the right lower quadrant. There is interval presence of diffuse free intra-abdominal air. This is likely residual postoperative gas but follow-up is suggested to exclude a leak. Several loculated fluid collections are demonstrated in the right lower quadrant and pelvis. Largest is in the posterior pelvis posterior to the uterus and measuring 9.9 x 10.2 cm. Fluid collection anterior to the uterus measures up to about 8.1 cm diameter. Less well defined fluid collection in the right lower quadrant posterior to the cecum in anastomosis measuring about 6.4 cm in diameter. Fluid collections have increased density measurements. There is a small amount of diffuse free fluid throughout the abdomen as well. While these could represent postoperative fluid collections, the appearance is worrisome for multiple abscesses. Seromas or hematomas less likely. Musculoskeletal: No acute or significant osseous findings. Review of the MIP images confirms the above findings. IMPRESSION: CTA CHEST: No evidence of significant pulmonary embolus. No evidence of active pulmonary disease. CT ABDOMEN AND PELVIS: 1. Multiple loculated fluid collections throughout the abdomen and pelvis with well-defined low-density components. These are worrisome for postoperative abscesses. Hematoma/seroma less likely. 2. Diffuse free intraperitoneal air and free fluid, likely postoperative. Consider follow-up to exclude bowel leak. 3. Fluid-filled dilated small bowel loops with distal  decompression likely indicating small bowel obstruction. Ileus less likely. No abnormal bowel wall thickening or pneumatosis. 4. Right ureteral stent with proximal pigtail in the right renal pelvis and distal pigtail in the bladder. Residual hydronephrosis and hydroureter may indicate poor stent function. Small amount of gas in the bladder may be due to previous instrumentation. Electronically Signed   By: Lucienne Capers M.D.   On: 05/06/2018 23:49   Ct Abdomen Pelvis W Contrast  Result Date: 05/06/2018 CLINICAL DATA:  One week postop from laparoscopic hernia repair. Lots of gas and discomfort. Bloating, tachycardia, and fever. Concern about heart rate. EXAM: CT ANGIOGRAPHY CHEST CT ABDOMEN AND PELVIS WITH CONTRAST TECHNIQUE: Multidetector CT imaging of the chest was performed using the standard protocol during bolus administration of intravenous contrast. Multiplanar CT image reconstructions and MIPs were obtained to evaluate the vascular anatomy. Multidetector CT imaging of the abdomen and pelvis was performed using the standard protocol during bolus administration of intravenous contrast. CONTRAST:  168mL ISOVUE-370 IOPAMIDOL (ISOVUE-370) INJECTION 76% COMPARISON:  CT abdomen and pelvis 02/28/2018 FINDINGS: CTA CHEST FINDINGS Cardiovascular: Examination is somewhat limited due to motion artifact. There is good opacification of the central and segmental pulmonary arteries. No focal filling defects demonstrated. No evidence of significant pulmonary embolus. Normal caliber thoracic aorta. No aortic dissection. Great vessel origins are patent. Normal heart size. No pericardial effusions. Mediastinum/Nodes: Esophagus is decompressed. Probable small esophageal hiatal hernia. No significant lymphadenopathy in the chest. Lungs/Pleura: Evaluation is limited by motion artifact. No focal consolidation and no airspace disease identified. No pleural effusions. No pneumothorax. Musculoskeletal: No chest wall abnormality.  No acute or significant osseous findings. Review of the MIP images confirms the above findings. CT ABDOMEN and PELVIS FINDINGS Hepatobiliary: Large stones in the gallbladder. No wall thickening or inflammatory changes. No bile duct dilatation. No focal liver lesions. Pancreas: Unremarkable. No pancreatic ductal dilatation or surrounding inflammatory changes.  Spleen: Normal in size without focal abnormality. Adrenals/Urinary Tract: No adrenal gland nodules. Renal nephrograms are homogeneous and normal. Right ureteral stent with proximal pigtail in the right renal pelvis and distal pigtail in the bladder. There is some residual hydronephrosis and hydroureter which may indicate poor stent function. Left kidney and ureter are unremarkable. Small amount of gas in the bladder without bladder wall thickening. This may be due to previous instrumentation. Stomach/Bowel: Gas and fluid in the stomach without abnormal distention or wall thickening. Dilated fluid-filled small bowel with distal decompression. Appearance suspicious for small bowel obstruction. Ileus less likely due to change in caliber. Scattered stool in the colon without significant colonic distention. Anastomosis in the right colon. No definite bowel wall or colonic wall thickening. Vascular/Lymphatic: No significant vascular findings are present. No enlarged abdominal or pelvic lymph nodes. Reproductive: Uterus and ovaries are not enlarged. Other: Since the previous study, there has been interval removal of the partially calcified soft tissue mass seen previously in the right lower quadrant. There is interval presence of diffuse free intra-abdominal air. This is likely residual postoperative gas but follow-up is suggested to exclude a leak. Several loculated fluid collections are demonstrated in the right lower quadrant and pelvis. Largest is in the posterior pelvis posterior to the uterus and measuring 9.9 x 10.2 cm. Fluid collection anterior to the uterus  measures up to about 8.1 cm diameter. Less well defined fluid collection in the right lower quadrant posterior to the cecum in anastomosis measuring about 6.4 cm in diameter. Fluid collections have increased density measurements. There is a small amount of diffuse free fluid throughout the abdomen as well. While these could represent postoperative fluid collections, the appearance is worrisome for multiple abscesses. Seromas or hematomas less likely. Musculoskeletal: No acute or significant osseous findings. Review of the MIP images confirms the above findings. IMPRESSION: CTA CHEST: No evidence of significant pulmonary embolus. No evidence of active pulmonary disease. CT ABDOMEN AND PELVIS: 1. Multiple loculated fluid collections throughout the abdomen and pelvis with well-defined low-density components. These are worrisome for postoperative abscesses. Hematoma/seroma less likely. 2. Diffuse free intraperitoneal air and free fluid, likely postoperative. Consider follow-up to exclude bowel leak. 3. Fluid-filled dilated small bowel loops with distal decompression likely indicating small bowel obstruction. Ileus less likely. No abnormal bowel wall thickening or pneumatosis. 4. Right ureteral stent with proximal pigtail in the right renal pelvis and distal pigtail in the bladder. Residual hydronephrosis and hydroureter may indicate poor stent function. Small amount of gas in the bladder may be due to previous instrumentation. Electronically Signed   By: Lucienne Capers M.D.   On: 05/06/2018 23:49   Ct Image Guided Drainage By Percutaneous Catheter  Result Date: 05/07/2018 INDICATION: 42 year old female with a history of pelvic fluid collection. EXAM: CT-GUIDED DRAINAGE OF PELVIC FLUID MEDICATIONS: The patient is currently admitted to the hospital and receiving intravenous antibiotics. The antibiotics were administered within an appropriate time frame prior to the initiation of the procedure. ANESTHESIA/SEDATION:  4.0 mg IV Versed 100 mcg IV Fentanyl Moderate Sedation Time:  10 minutes The patient was continuously monitored during the procedure by the interventional radiology nurse under my direct supervision. COMPLICATIONS: None TECHNIQUE: Informed written consent was obtained from the patient after a thorough discussion of the procedural risks, benefits and alternatives. All questions were addressed. Maximal Sterile Barrier Technique was utilized including caps, mask, sterile gowns, sterile gloves, sterile drape, hand hygiene and skin antiseptic. A timeout was performed prior to the initiation of the  procedure. PROCEDURE: The right gluteal region was prepped with chlorhexidine in a sterile fashion, and a sterile drape was applied covering the operative field. A sterile gown and sterile gloves were used for the procedure. Local anesthesia was provided with 1% Lidocaine. Using CT guidance, modified Seldinger technique was used to place a right transgluteal 12 [place] drain. Approximately 350 cc of dark bloody fluid aspirated. Catheter was sutured in position attached to bulb suction. Sample sent for analysis. Patient tolerated the procedure well and remained hemodynamically stable throughout. No complications were encountered and no significant blood loss. FINDINGS: Abscess of the pelvis treated with right trans gluteal drain. IMPRESSION: Status post right transgluteal 12 French drainage of pelvic fluid collection. Sample sent for culture. Signed, Dulcy Fanny. Dellia Nims, RPVI [facility] Inter[facility] Radiology [facility]ists [facility] Radiology Electronically Signed   By: Corrie Mckusick D.O.   On: 05/07/2018 18:00    Anti-infectives: Anti-infectives (From admission, onward)   Start     Dose/Rate Route Frequency Ordered Stop   05/07/18 0630  metroNIDAZOLE (FLAGYL) IVPB 500 mg     500 mg 100 mL/hr over 60 Minutes Intravenous Every 8 hours 05/07/18 0144     05/07/18 0300  cefTRIAXone (ROCEPHIN) 2 g in sodium chloride 0.9 %  100 mL IVPB     2 g 200 mL/hr over 30 Minutes Intravenous Daily at bedtime 05/07/18 0144     05/06/18 2300  ceFEPIme (MAXIPIME) 2 g in sodium chloride 0.9 % 100 mL IVPB     2 g 200 mL/hr over 30 Minutes Intravenous  Once 05/06/18 2256 05/06/18 2346   05/06/18 2300  metroNIDAZOLE (FLAGYL) IVPB 500 mg     500 mg 100 mL/hr over 60 Minutes Intravenous  Once 05/06/18 2256 05/06/18 2355      Assessment/Plan: s/p * No surgery found *  S/p right hemicolectomy c/b bleeding, now with likely infected hematoma.    iv antibiotics  Diet as tolerated Switch to oral antibiotics in the next few days.  She has other fluid collection that may need draining if symtpoms and WBCs do not improve.     LOS: 1 day    Stark Klein 05/08/2018

## 2018-05-08 NOTE — Progress Notes (Signed)
RRT nurse notified for an increase in heart rate and respirations, MEWS score of 4, MEWS score decreased to a 2 with a decrease in heart rate and respirations. [place] Davonn Flanery RN 05/08/2018 0010

## 2018-05-09 LAB — CBC
HCT: 22.7 % — ABNORMAL LOW (ref 36.0–46.0)
Hemoglobin: 7.6 g/dL — ABNORMAL LOW (ref 12.0–15.0)
MCH: 29.9 pg (ref 26.0–34.0)
MCHC: 33.5 g/dL (ref 30.0–36.0)
MCV: 89.4 fL (ref 78.0–100.0)
PLATELETS: 669 10*3/uL — AB (ref 150–400)
RBC: 2.54 MIL/uL — ABNORMAL LOW (ref 3.87–5.11)
RDW: 14.9 % (ref 11.5–15.5)
WBC: 22.8 10*3/uL — AB (ref 4.0–10.5)

## 2018-05-09 LAB — BASIC METABOLIC PANEL
ANION GAP: 8 (ref 5–15)
BUN: 10 mg/dL (ref 6–20)
CALCIUM: 7.8 mg/dL — AB (ref 8.9–10.3)
CO2: 29 mmol/L (ref 22–32)
Chloride: 102 mmol/L (ref 98–111)
Creatinine, Ser: 0.46 mg/dL (ref 0.44–1.00)
GFR calc Af Amer: >60 mL/min (ref >60)
GLUCOSE: 106 mg/dL — AB (ref 70–99)
Potassium: 3.8 mmol/L (ref 3.5–5.1)
Sodium: 139 mmol/L (ref 135–145)

## 2018-05-09 NOTE — Progress Notes (Signed)
Patient ID: Erica Ross, female   DOB: April 02, 1976, 42 y.o.   MRN: [id number] [facility] Surgery Progress Note:   * No surgery found *  Subjective: Mental status is clear.  She is still nauseated and just not feeling well.  Weak Objective: Vital signs in last 24 hours: Temp:  [98.6 F (37 C)] 98.6 F (37 C) (09/21 9379) Pulse Rate:  [105-111] 111 (09/21 0633) Resp:  [16-18] 16 (09/21 0633) BP: (116)/(59) 116/59 (09/21 0633) SpO2:  [98 %-100 %] 98 % (09/21 0240)  Intake/Output from previous day: 09/20 0701 - 09/21 0700 In: 2275.3 [P.O.:440; I.V.:1421.8; IV Piggyback:403.5] Out: 220 [Urine:200; Drains:20] Intake/Output this shift: No intake/output data recorded.  Physical Exam: Work of breathing is not labored at rest.  Pulse>110 but Hg ~ 7.  Drain old bloody  Lab Results:  Results for orders placed or performed during the hospital encounter of 05/06/18 (from the past 48 hour(s))  Protime-INR     Status: None   Collection Time: 05/07/18  1:38 PM  Result Value Ref Range   Prothrombin Time 13.9 11.4 - 15.2 seconds   INR 1.08     Comment: Performed at [facility], [place] [address], [place], [place] [address]  Aerobic/Anaerobic Culture (surgical/deep wound)     Status: None (Preliminary result)   Collection Time: 05/07/18  6:00 PM  Result Value Ref Range   Specimen Description      ABDOMEN ABSCESS Performed at [place] [address], [place], [place] [address]    Special Requests      NONE Performed at [facility], [place] [address] [place], [place] [address]    Gram Stain      ABUNDANT WBC PRESENT, PREDOMINANTLY PMN MODERATE GRAM NEGATIVE RODS    Culture      NO GROWTH < 12 HOURS Performed at [place] [address], [place], [place] [address]    Report Status PENDING   Lactic acid, plasma     Status: None   Collection Time: 05/08/18  5:09 AM  Result Value Ref Range   Lactic Acid, Venous  1.0 0.5 - 1.9 mmol/L    Comment: Performed at [facility], [place] [address] [place], [place] [address]  CBC     Status: Abnormal   Collection Time: 05/08/18  5:09 AM  Result Value Ref Range   WBC 17.0 (H) 4.0 - 10.5 K/uL   RBC 2.44 (L) 3.87 - 5.11 MIL/uL   Hemoglobin 7.3 (L) 12.0 - 15.0 g/dL   HCT 21.9 (L) 36.0 - 46.0 %   MCV 89.8 78.0 - 100.0 fL   MCH 29.9 26.0 - 34.0 pg   MCHC 33.3 30.0 - 36.0 g/dL   RDW 14.8 11.5 - 15.5 %   Platelets 574 (H) 150 - 400 K/uL    Comment: Performed at [facility], [place] [address] [place], [place] [address]  Basic metabolic panel     Status: Abnormal   Collection Time: 05/08/18  5:09 AM  Result Value Ref Range   Sodium 134 (L) 135 - 145 mmol/L   Potassium 3.9 3.5 - 5.1 mmol/L   Chloride 101 98 - 111 mmol/L   CO2 25 22 - 32 mmol/L   Glucose, Bld 131 (H) 70 - 99 mg/dL   BUN 11 6 - 20 mg/dL   Creatinine, Ser 0.37 (L) 0.44 - 1.00 mg/dL   Calcium 7.4 (L) 8.9 - 10.3 mg/dL  GFR calc non Af Amer >60 >60 mL/min   GFR calc Af Amer >60 >60 mL/min    Comment: (NOTE) The eGFR has been calculated using the CKD EPI equation. This calculation has not been validated in all clinical situations. eGFR's persistently <60 mL/min signify possible Chronic Kidney Disease.    Anion gap 8 5 - 15    Comment: Performed at [facility], [place] [address], [place], [place] [address]  CBC     Status: Abnormal   Collection Time: 05/09/18  4:04 AM  Result Value Ref Range   WBC 22.8 (H) 4.0 - 10.5 K/uL   RBC 2.54 (L) 3.87 - 5.11 MIL/uL   Hemoglobin 7.6 (L) 12.0 - 15.0 g/dL   HCT 22.7 (L) 36.0 - 46.0 %   MCV 89.4 78.0 - 100.0 fL   MCH 29.9 26.0 - 34.0 pg   MCHC 33.5 30.0 - 36.0 g/dL   RDW 14.9 11.5 - 15.5 %   Platelets 669 (H) 150 - 400 K/uL    Comment: Performed at [facility], [place] [address], [place], [place] [address]  Basic metabolic panel     Status: Abnormal   Collection Time: 05/09/18   4:04 AM  Result Value Ref Range   Sodium 139 135 - 145 mmol/L   Potassium 3.8 3.5 - 5.1 mmol/L   Chloride 102 98 - 111 mmol/L   CO2 29 22 - 32 mmol/L   Glucose, Bld 106 (H) 70 - 99 mg/dL   BUN 10 6 - 20 mg/dL   Creatinine, Ser 0.46 0.44 - 1.00 mg/dL   Calcium 7.8 (L) 8.9 - 10.3 mg/dL   GFR calc non Af Amer >60 >60 mL/min   GFR calc Af Amer >60 >60 mL/min    Comment: (NOTE) The eGFR has been calculated using the CKD EPI equation. This calculation has not been validated in all clinical situations. eGFR's persistently <60 mL/min signify possible Chronic Kidney Disease.    Anion gap 8 5 - 15    Comment: Performed at [facility], [place] [address], [place], [place] [address]    Radiology/Results: Ct Image Guided Drainage By Percutaneous Catheter  Result Date: 05/07/2018 INDICATION: 42 year old female with a history of pelvic fluid collection. EXAM: CT-GUIDED DRAINAGE OF PELVIC FLUID MEDICATIONS: The patient is currently admitted to the hospital and receiving intravenous antibiotics. The antibiotics were administered within an appropriate time frame prior to the initiation of the procedure. ANESTHESIA/SEDATION: 4.0 mg IV Versed 100 mcg IV Fentanyl Moderate Sedation Time:  10 minutes The patient was continuously monitored during the procedure by the interventional radiology nurse under my direct supervision. COMPLICATIONS: None TECHNIQUE: Informed written consent was obtained from the patient after a thorough discussion of the procedural risks, benefits and alternatives. All questions were addressed. Maximal Sterile Barrier Technique was utilized including caps, mask, sterile gowns, sterile gloves, sterile drape, hand hygiene and skin antiseptic. A timeout was performed prior to the initiation of the procedure. PROCEDURE: The right gluteal region was prepped with chlorhexidine in a sterile fashion, and a sterile drape was applied covering the operative field. A sterile gown and  sterile gloves were used for the procedure. Local anesthesia was provided with 1% Lidocaine. Using CT guidance, modified Seldinger technique was used to place a right transgluteal 12 [place] drain. Approximately 350 cc of dark bloody fluid aspirated. Catheter was sutured in position attached to bulb suction. Sample sent for analysis. Patient tolerated the procedure well and remained hemodynamically stable  throughout. No complications were encountered and no significant blood loss. FINDINGS: Abscess of the pelvis treated with right trans gluteal drain. IMPRESSION: Status post right transgluteal 12 French drainage of pelvic fluid collection. Sample sent for culture. Signed, Dulcy Fanny. Dellia Nims, RPVI Vascular and Interventional Radiology Specialists [facility] Radiology Electronically Signed   By: Corrie Mckusick D.O.   On: 05/07/2018 18:00    Anti-infectives: Anti-infectives (From admission, onward)   Start     Dose/Rate Route Frequency Ordered Stop   05/07/18 0630  metroNIDAZOLE (FLAGYL) IVPB 500 mg     500 mg 100 mL/hr over 60 Minutes Intravenous Every 8 hours 05/07/18 0144     05/07/18 0300  cefTRIAXone (ROCEPHIN) 2 g in sodium chloride 0.9 % 100 mL IVPB     2 g 200 mL/hr over 30 Minutes Intravenous Daily at bedtime 05/07/18 0144     05/06/18 2300  ceFEPIme (MAXIPIME) 2 g in sodium chloride 0.9 % 100 mL IVPB     2 g 200 mL/hr over 30 Minutes Intravenous  Once 05/06/18 2256 05/06/18 2346   05/06/18 2300  metroNIDAZOLE (FLAGYL) IVPB 500 mg     500 mg 100 mL/hr over 60 Minutes Intravenous  Once 05/06/18 2256 05/06/18 2355      Assessment/Plan: Problem List: Patient Active Problem List   Diagnosis Date Noted  . Post-operative infection 05/07/2018  . 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 Adnexal mass 03/17/2018  . 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  GM neg rods -on Rocephin  Flagyl IV.  May need a unit of blood at  some point.   * No surgery found *    LOS: 2 days   Matt B. Hassell Done, MD, [facility] [phone] beeper [phone]  05/09/2018 9:13 AM

## 2018-05-10 LAB — BASIC METABOLIC PANEL
Anion gap: 7 (ref 5–15)
BUN: 9 mg/dL (ref 6–20)
CHLORIDE: 105 mmol/L (ref 98–111)
CO2: 29 mmol/L (ref 22–32)
CREATININE: 0.36 mg/dL — AB (ref 0.44–1.00)
Calcium: 7.9 mg/dL — ABNORMAL LOW (ref 8.9–10.3)
GFR calc non Af Amer: >60 mL/min (ref >60)
Glucose, Bld: 101 mg/dL — ABNORMAL HIGH (ref 70–99)
Potassium: 3.8 mmol/L (ref 3.5–5.1)
Sodium: 141 mmol/L (ref 135–145)

## 2018-05-10 LAB — AEROBIC/ANAEROBIC CULTURE (SURGICAL/DEEP WOUND)

## 2018-05-10 LAB — CBC
HCT: 21.7 % — ABNORMAL LOW (ref 36.0–46.0)
Hemoglobin: 7.1 g/dL — ABNORMAL LOW (ref 12.0–15.0)
MCH: 29.2 pg (ref 26.0–34.0)
MCHC: 32.7 g/dL (ref 30.0–36.0)
MCV: 89.3 fL (ref 78.0–100.0)
Platelets: 703 10*3/uL — ABNORMAL HIGH (ref 150–400)
RBC: 2.43 MIL/uL — ABNORMAL LOW (ref 3.87–5.11)
RDW: 15.1 % (ref 11.5–15.5)
WBC: 21.8 10*3/uL — ABNORMAL HIGH (ref 4.0–10.5)

## 2018-05-10 LAB — CULTURE, BLOOD (ROUTINE X 2): SPECIAL REQUESTS: ADEQUATE

## 2018-05-10 MED ORDER — PREMIER PROTEIN SHAKE
2.0000 [oz_av] | Freq: Four times a day (QID) | ORAL | Status: DC
  Administered 2018-05-10 – 2018-05-11 (×3): 2 [oz_av] via ORAL
  Filled 2018-05-10 (×8): qty 325.31

## 2018-05-10 MED ORDER — ENSURE ENLIVE PO LIQD: 237.0000 mL | Freq: Two times a day (BID) | ORAL | Status: DC

## 2018-05-10 MED ORDER — ACETAMINOPHEN 500 MG PO TABS: 1000.0000 mg | ORAL_TABLET | Freq: Four times a day (QID) | ORAL | Status: DC | PRN

## 2018-05-10 NOTE — Progress Notes (Signed)
Referring Physician(s): Byerly,F  Supervising Physician: Aletta Edouard  Patient Status:  [facility] - In-pt  Chief Complaint: Abdominal-pelvic fluid collections   Subjective: Pt doing ok; denies worsening abd pain, N/V; does notice some pelvic fullness when getting up to bathroom; still tachycardic but afebrile   Allergies: Watermelon [citrullus vulgaris] and Amoxicillin  Medications: Prior to Admission medications   Medication Sig Start Date End Date Taking? Authorizing Provider  acetaminophen (TYLENOL) 500 MG tablet Take 500-1,000 mg by mouth daily as needed for moderate pain.   Yes [provider]  Ascorbic Acid (VITAMIN C) 1000 MG tablet Take 1,000 mg by mouth daily. w/Elderberry   Yes [provider]  Cholecalciferol (VITAMIN D-3) 5000 units TABS Take 1,500 Units by mouth daily.    Yes [provider]  IRON PO Take 1 tablet by mouth daily.   Yes [provider]  Misc Natural Products (SUPER GREENS) POWD Take 1 Scoop by mouth daily.   Yes [provider]  Probiotic Product (PROBIOTIC PO) Take 1 capsule by mouth daily.   Yes [provider]  traMADol (ULTRAM) 50 MG tablet Take 1-2 tablets (50-100 mg total) by mouth every 6 (six) hours as needed. 05/03/18  Yes Armandina Gemma, MD  Diindolylmethane POWD Take 1 capsule by mouth daily. (DIM)    [provider]  EVENING PRIMROSE OIL PO Take 1,200 mg by mouth daily.    [provider]  nitrofurantoin, macrocrystal-monohydrate, (MACROBID) 100 MG capsule Take 1 capsule (100 mg total) by mouth 2 (two) times daily. 1 po BId Patient not taking: Reported on 05/06/2018 03/21/18   Dettinger, Fransisca Kaufmann, MD  Omega-3 Fatty Acids (FISH OIL OMEGA-3 PO) Take 2 capsules by mouth daily.     [provider]  RESVERATROL PO Take 1 capsule by mouth daily.     [provider]  SODIUM BICARBONATE PO Take 2 capsules by mouth at bedtime.    [provider]      Vital Signs: BP 117/62 (BP Location: Right Arm)   Pulse (!) 111   Temp 98.2 F (36.8 C) (Oral)   Resp 17   Ht 5\' 4"  (1.626 m)   Wt 130 lb (59 kg)   LMP 04/29/2018   SpO2 99%   BMI 22.31 kg/m   Physical Exam rt TG drain intact, dressing dry, site mildly tender, output 25 cc blood-tinged fluid  Imaging: Ct Angio Chest Pe W/cm &/or Wo Cm  Result Date: 05/06/2018 CLINICAL DATA:  One week postop from laparoscopic hernia repair. Lots of gas and discomfort. Bloating, tachycardia, and fever. Concern about heart rate. EXAM: CT ANGIOGRAPHY CHEST CT ABDOMEN AND PELVIS WITH CONTRAST TECHNIQUE: Multidetector CT imaging of the chest was performed using the standard protocol during bolus administration of intravenous contrast. Multiplanar CT image reconstructions and MIPs were obtained to evaluate the vascular anatomy. Multidetector CT imaging of the abdomen and pelvis was performed using the standard protocol during bolus administration of intravenous contrast. CONTRAST:  169mL ISOVUE-370 IOPAMIDOL (ISOVUE-370) INJECTION 76% COMPARISON:  CT abdomen and pelvis 02/28/2018 FINDINGS: CTA CHEST FINDINGS Cardiovascular: Examination is somewhat limited due to motion artifact. There is good opacification of the central and segmental pulmonary arteries. No focal filling defects demonstrated. No evidence of significant pulmonary embolus. Normal caliber thoracic aorta. No aortic dissection. Great vessel origins are patent. Normal heart size. No pericardial effusions. Mediastinum/Nodes: Esophagus is decompressed. Probable small esophageal hiatal hernia. No significant lymphadenopathy in the chest. Lungs/Pleura: Evaluation is limited by motion artifact.  No focal consolidation and no airspace disease identified. No pleural effusions. No pneumothorax. Musculoskeletal: No chest wall abnormality. No acute or significant osseous findings. Review of the MIP images confirms the above findings. CT ABDOMEN and PELVIS FINDINGS  Hepatobiliary: Large stones in the gallbladder. No wall thickening or inflammatory changes. No bile duct dilatation. No focal liver lesions. Pancreas: Unremarkable. No pancreatic ductal dilatation or surrounding inflammatory changes. Spleen: Normal in size without focal abnormality. Adrenals/Urinary Tract: No adrenal gland nodules. Renal nephrograms are homogeneous and normal. Right ureteral stent with proximal pigtail in the right renal pelvis and distal pigtail in the bladder. There is some residual hydronephrosis and hydroureter which may indicate poor stent function. Left kidney and ureter are unremarkable. Small amount of gas in the bladder without bladder wall thickening. This may be due to previous instrumentation. Stomach/Bowel: Gas and fluid in the stomach without abnormal distention or wall thickening. Dilated fluid-filled small bowel with distal decompression. Appearance suspicious for small bowel obstruction. Ileus less likely due to change in caliber. Scattered stool in the colon without significant colonic distention. Anastomosis in the right colon. No definite bowel wall or colonic wall thickening. Vascular/Lymphatic: No significant vascular findings are present. No enlarged abdominal or pelvic lymph nodes. Reproductive: Uterus and ovaries are not enlarged. Other: Since the previous study, there has been interval removal of the partially calcified soft tissue mass seen previously in the right lower quadrant. There is interval presence of diffuse free intra-abdominal air. This is likely residual postoperative gas but follow-up is suggested to exclude a leak. Several loculated fluid collections are demonstrated in the right lower quadrant and pelvis. Largest is in the posterior pelvis posterior to the uterus and measuring 9.9 x 10.2 cm. Fluid collection anterior to the uterus measures up to about 8.1 cm diameter. Less well defined fluid collection in the right lower quadrant posterior to the cecum in  anastomosis measuring about 6.4 cm in diameter. Fluid collections have increased density measurements. There is a small amount of diffuse free fluid throughout the abdomen as well. While these could represent postoperative fluid collections, the appearance is worrisome for multiple abscesses. Seromas or hematomas less likely. Musculoskeletal: No acute or significant osseous findings. Review of the MIP images confirms the above findings. IMPRESSION: CTA CHEST: No evidence of significant pulmonary embolus. No evidence of active pulmonary disease. CT ABDOMEN AND PELVIS: 1. Multiple loculated fluid collections throughout the abdomen and pelvis with well-defined low-density components. These are worrisome for postoperative abscesses. Hematoma/seroma less likely. 2. Diffuse free intraperitoneal air and free fluid, likely postoperative. Consider follow-up to exclude bowel leak. 3. Fluid-filled dilated small bowel loops with distal decompression likely indicating small bowel obstruction. Ileus less likely. No abnormal bowel wall thickening or pneumatosis. 4. Right ureteral stent with proximal pigtail in the right renal pelvis and distal pigtail in the bladder. Residual hydronephrosis and hydroureter may indicate poor stent function. Small amount of gas in the bladder may be due to previous instrumentation. Electronically Signed   By: Lucienne Capers M.D.   On: 05/06/2018 23:49   Ct Abdomen Pelvis W Contrast  Result Date: 05/06/2018 CLINICAL DATA:  One week postop from laparoscopic hernia repair. Lots of gas and discomfort. Bloating, tachycardia, and fever. Concern about heart rate. EXAM: CT ANGIOGRAPHY CHEST CT ABDOMEN AND PELVIS WITH CONTRAST TECHNIQUE: Multidetector CT imaging of the chest was performed using the standard protocol during bolus administration of intravenous contrast. Multiplanar CT image reconstructions and MIPs were obtained to evaluate the vascular anatomy. Multidetector CT  imaging of the abdomen  and pelvis was performed using the standard protocol during bolus administration of intravenous contrast. CONTRAST:  134mL ISOVUE-370 IOPAMIDOL (ISOVUE-370) INJECTION 76% COMPARISON:  CT abdomen and pelvis 02/28/2018 FINDINGS: CTA CHEST FINDINGS Cardiovascular: Examination is somewhat limited due to motion artifact. There is good opacification of the central and segmental pulmonary arteries. No focal filling defects demonstrated. No evidence of significant pulmonary embolus. Normal caliber thoracic aorta. No aortic dissection. Great vessel origins are patent. Normal heart size. No pericardial effusions. Mediastinum/Nodes: Esophagus is decompressed. Probable small esophageal hiatal hernia. No significant lymphadenopathy in the chest. Lungs/Pleura: Evaluation is limited by motion artifact. No focal consolidation and no airspace disease identified. No pleural effusions. No pneumothorax. Musculoskeletal: No chest wall abnormality. No acute or significant osseous findings. Review of the MIP images confirms the above findings. CT ABDOMEN and PELVIS FINDINGS Hepatobiliary: Large stones in the gallbladder. No wall thickening or inflammatory changes. No bile duct dilatation. No focal liver lesions. Pancreas: Unremarkable. No pancreatic ductal dilatation or surrounding inflammatory changes. Spleen: Normal in size without focal abnormality. Adrenals/Urinary Tract: No adrenal gland nodules. Renal nephrograms are homogeneous and normal. Right ureteral stent with proximal pigtail in the right renal pelvis and distal pigtail in the bladder. There is some residual hydronephrosis and hydroureter which may indicate poor stent function. Left kidney and ureter are unremarkable. Small amount of gas in the bladder without bladder wall thickening. This may be due to previous instrumentation. Stomach/Bowel: Gas and fluid in the stomach without abnormal distention or wall thickening. Dilated fluid-filled small bowel with distal  decompression. Appearance suspicious for small bowel obstruction. Ileus less likely due to change in caliber. Scattered stool in the colon without significant colonic distention. Anastomosis in the right colon. No definite bowel wall or colonic wall thickening. Vascular/Lymphatic: No significant vascular findings are present. No enlarged abdominal or pelvic lymph nodes. Reproductive: Uterus and ovaries are not enlarged. Other: Since the previous study, there has been interval removal of the partially calcified soft tissue mass seen previously in the right lower quadrant. There is interval presence of diffuse free intra-abdominal air. This is likely residual postoperative gas but follow-up is suggested to exclude a leak. Several loculated fluid collections are demonstrated in the right lower quadrant and pelvis. Largest is in the posterior pelvis posterior to the uterus and measuring 9.9 x 10.2 cm. Fluid collection anterior to the uterus measures up to about 8.1 cm diameter. Less well defined fluid collection in the right lower quadrant posterior to the cecum in anastomosis measuring about 6.4 cm in diameter. Fluid collections have increased density measurements. There is a small amount of diffuse free fluid throughout the abdomen as well. While these could represent postoperative fluid collections, the appearance is worrisome for multiple abscesses. Seromas or hematomas less likely. Musculoskeletal: No acute or significant osseous findings. Review of the MIP images confirms the above findings. IMPRESSION: CTA CHEST: No evidence of significant pulmonary embolus. No evidence of active pulmonary disease. CT ABDOMEN AND PELVIS: 1. Multiple loculated fluid collections throughout the abdomen and pelvis with well-defined low-density components. These are worrisome for postoperative abscesses. Hematoma/seroma less likely. 2. Diffuse free intraperitoneal air and free fluid, likely postoperative. Consider follow-up to exclude  bowel leak. 3. Fluid-filled dilated small bowel loops with distal decompression likely indicating small bowel obstruction. Ileus less likely. No abnormal bowel wall thickening or pneumatosis. 4. Right ureteral stent with proximal pigtail in the right renal pelvis and distal pigtail in the bladder. Residual hydronephrosis and hydroureter may  indicate poor stent function. Small amount of gas in the bladder may be due to previous instrumentation. Electronically Signed   By: Lucienne Capers M.D.   On: 05/06/2018 23:49   Ct Image Guided Drainage By Percutaneous Catheter  Result Date: 05/07/2018 INDICATION: 42 year old female with a history of pelvic fluid collection. EXAM: CT-GUIDED DRAINAGE OF PELVIC FLUID MEDICATIONS: The patient is currently admitted to the hospital and receiving intravenous antibiotics. The antibiotics were administered within an appropriate time frame prior to the initiation of the procedure. ANESTHESIA/SEDATION: 4.0 mg IV Versed 100 mcg IV Fentanyl Moderate Sedation Time:  10 minutes The patient was continuously monitored during the procedure by the interventional radiology nurse under my direct supervision. COMPLICATIONS: None TECHNIQUE: Informed written consent was obtained from the patient after a thorough discussion of the procedural risks, benefits and alternatives. All questions were addressed. Maximal Sterile Barrier Technique was utilized including caps, mask, sterile gowns, sterile gloves, sterile drape, hand hygiene and skin antiseptic. A timeout was performed prior to the initiation of the procedure. PROCEDURE: The right gluteal region was prepped with chlorhexidine in a sterile fashion, and a sterile drape was applied covering the operative field. A sterile gown and sterile gloves were used for the procedure. Local anesthesia was provided with 1% Lidocaine. Using CT guidance, modified Seldinger technique was used to place a right transgluteal 12 [place] drain. Approximately 350 cc  of dark bloody fluid aspirated. Catheter was sutured in position attached to bulb suction. Sample sent for analysis. Patient tolerated the procedure well and remained hemodynamically stable throughout. No complications were encountered and no significant blood loss. FINDINGS: Abscess of the pelvis treated with right trans gluteal drain. IMPRESSION: Status post right transgluteal 12 French drainage of pelvic fluid collection. Sample sent for culture. Signed, Dulcy Fanny. Dellia Nims, RPVI [facility] Electronically Signed   By: Corrie Mckusick D.O.   On: 05/07/2018 18:00    Labs:  CBC: Recent Labs    05/07/18 0434 05/08/18 0509 05/09/18 0404 05/10/18 0432  WBC 21.8* 17.0* 22.8* 21.8*  HGB 8.4* 7.3* 7.6* 7.1*  HCT 24.3* 21.9* 22.7* 21.7*  PLT 654* 574* 669* 703*    COAGS: Recent Labs    05/07/18 1338  INR 1.08    BMP: Recent Labs    05/07/18 0434 05/08/18 0509 05/09/18 0404 05/10/18 0432  NA 134* 134* 139 141  K 4.2 3.9 3.8 3.8  CL 97* 101 102 105  CO2 26 25 29 29   GLUCOSE 138* 131* 106* 101*  BUN 19 11 10 9   CALCIUM 8.4* 7.4* 7.8* 7.9*  CREATININE 0.52 0.37* 0.46 0.36*  GFRNONAA >60 >60 >60 >60  GFRAA >60 >60 >60 >60    LIVER FUNCTION TESTS: Recent Labs    02/10/18 1037 05/06/18 2104  BILITOT 0.3 1.0  AST 16 26  ALT 14 18  ALKPHOS 68 76  PROT 7.6 6.7  ALBUMIN 4.4 2.5*    Assessment and Plan: Pt s/p right ureteral stent, laparoscopic partial colectomy- R hemicolectomy, repair of umbilical hernia 0/93/2355; path with inflamm myofibroblastic tumor; postop abdominal /pelvic fluid collections, status post right transgluteal drain placement 9/19; afebrile; WBC 21.8(22.8), hgb 7.1(7.6)- consider transfusion; creat 0.36; drain fluid cx- bacteroides; f/u CT ordered for 9/23   Electronically Signed: D. Rowe Robert, PA-C 05/10/2018, 3:40 PM   I spent a total of 15 minutes at the the patient's bedside AND on the  patient's hospital floor or unit, greater than 50% of which was counseling/coordinating  care for abd/pelvic abscess drain    Patient ID: Erica Ross, female   DOB: 10/10/1975, 42 y.o.   MRN: [id number]

## 2018-05-10 NOTE — Progress Notes (Signed)
Patient ID: Erica Ross, female   DOB: 10-20-1975, 42 y.o.   MRN: [id number] [facility] Surgery Progress Note:   * No surgery found *  Subjective: Mental status is clear Objective: Vital signs in last 24 hours: Temp:  [98 F (36.7 C)-98.9 F (37.2 C)] 98.4 F (36.9 C) (09/22 0523) Pulse Rate:  [103-117] 110 (09/22 0523) Resp:  [15-18] 18 (09/22 0523) BP: (110-117)/(62-75) 114/75 (09/22 0523) SpO2:  [97 %-100 %] 97 % (09/22 0523)  Intake/Output from previous day: 09/21 0701 - 09/22 0700 In: 1308.1 [P.O.:960; IV Piggyback:348.1] Out: 60 [Drains:60] Intake/Output this shift: No intake/output data recorded.  Physical Exam: Work of breathing is not labored but pulse rate is elevated.  JP is less and cloudy/bloody.    Lab Results:  Results for orders placed or performed during the hospital encounter of 05/06/18 (from the past 48 hour(s))  CBC     Status: Abnormal   Collection Time: 05/09/18  4:04 AM  Result Value Ref Range   WBC 22.8 (H) 4.0 - 10.5 K/uL   RBC 2.54 (L) 3.87 - 5.11 MIL/uL   Hemoglobin 7.6 (L) 12.0 - 15.0 g/dL   HCT 22.7 (L) 36.0 - 46.0 %   MCV 89.4 78.0 - 100.0 fL   MCH 29.9 26.0 - 34.0 pg   MCHC 33.5 30.0 - 36.0 g/dL   RDW 14.9 11.5 - 15.5 %   Platelets 669 (H) 150 - 400 K/uL    Comment: Performed at [facility], [place] [address], [place], [place] [address]  Basic metabolic panel     Status: Abnormal   Collection Time: 05/09/18  4:04 AM  Result Value Ref Range   Sodium 139 135 - 145 mmol/L   Potassium 3.8 3.5 - 5.1 mmol/L   Chloride 102 98 - 111 mmol/L   CO2 29 22 - 32 mmol/L   Glucose, Bld 106 (H) 70 - 99 mg/dL   BUN 10 6 - 20 mg/dL   Creatinine, Ser 0.46 0.44 - 1.00 mg/dL   Calcium 7.8 (L) 8.9 - 10.3 mg/dL   GFR calc non Af Amer >60 >60 mL/min   GFR calc Af Amer >60 >60 mL/min    Comment: (NOTE) The eGFR has been calculated using the CKD EPI equation. This calculation has not been validated in all clinical situations. eGFR's  persistently <60 mL/min signify possible Chronic Kidney Disease.    Anion gap 8 5 - 15    Comment: Performed at [facility], [place] [address] [place], [place] [address]  CBC     Status: Abnormal   Collection Time: 05/10/18  4:32 AM  Result Value Ref Range   WBC 21.8 (H) 4.0 - 10.5 K/uL   RBC 2.43 (L) 3.87 - 5.11 MIL/uL   Hemoglobin 7.1 (L) 12.0 - 15.0 g/dL   HCT 21.7 (L) 36.0 - 46.0 %   MCV 89.3 78.0 - 100.0 fL   MCH 29.2 26.0 - 34.0 pg   MCHC 32.7 30.0 - 36.0 g/dL   RDW 15.1 11.5 - 15.5 %   Platelets 703 (H) 150 - 400 K/uL    Comment: Performed at [facility], [place] [address] [place], [place] [address]  Basic metabolic panel     Status: Abnormal   Collection Time: 05/10/18  4:32 AM  Result Value Ref Range   Sodium 141 135 - 145 mmol/L   Potassium 3.8 3.5 - 5.1 mmol/L   Chloride 105 98 - 111 mmol/L  CO2 29 22 - 32 mmol/L   Glucose, Bld 101 (H) 70 - 99 mg/dL   BUN 9 6 - 20 mg/dL   Creatinine, Ser 0.36 (L) 0.44 - 1.00 mg/dL   Calcium 7.9 (L) 8.9 - 10.3 mg/dL   GFR calc non Af Amer >60 >60 mL/min   GFR calc Af Amer >60 >60 mL/min    Comment: (NOTE) The eGFR has been calculated using the CKD EPI equation. This calculation has not been validated in all clinical situations. eGFR's persistently <60 mL/min signify possible Chronic Kidney Disease.    Anion gap 7 5 - 15    Comment: Performed at [facility], [place] [address], [place], [place] [address]    Radiology/Results: No results found.  Anti-infectives: Anti-infectives (From admission, onward)   Start     Dose/Rate Route Frequency Ordered Stop   05/07/18 0630  metroNIDAZOLE (FLAGYL) IVPB 500 mg     500 mg 100 mL/hr over 60 Minutes Intravenous Every 8 hours 05/07/18 0144     05/07/18 0300  cefTRIAXone (ROCEPHIN) 2 g in sodium chloride 0.9 % 100 mL IVPB     2 g 200 mL/hr over 30 Minutes Intravenous Daily at bedtime 05/07/18 0144     05/06/18 2300  ceFEPIme  (MAXIPIME) 2 g in sodium chloride 0.9 % 100 mL IVPB     2 g 200 mL/hr over 30 Minutes Intravenous  Once 05/06/18 2256 05/06/18 2346   05/06/18 2300  metroNIDAZOLE (FLAGYL) IVPB 500 mg     500 mg 100 mL/hr over 60 Minutes Intravenous  Once 05/06/18 2256 05/06/18 2355      Assessment/Plan: Problem List: Patient Active Problem List   Diagnosis Date Noted  . Post-operative infection 05/07/2018  . 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 Adnexal mass 03/17/2018  . Hydronephrosis 03/17/2018    She feels better today than yesterday.  HG 7.1 today.  Discussed 1 unit transfusion and will let her discuss with Dr. Barry Dienes.  WBC elevated at 22K-will get repeat CT scan tomorrow morning to look for undrained collection.   * No surgery found *    LOS: 3 days   Matt B. Hassell Done, MD, [facility] [phone] beeper [phone]  05/10/2018 7:46 AM

## 2018-05-10 NOTE — Plan of Care (Signed)

## 2018-05-11 ENCOUNTER — Inpatient Hospital Stay (HOSPITAL_COMMUNITY): Payer: BLUE CROSS/BLUE SHIELD

## 2018-05-11 ENCOUNTER — Encounter (HOSPITAL_COMMUNITY): Payer: Self-pay | Admitting: Radiology

## 2018-05-11 LAB — CBC WITH DIFFERENTIAL/PLATELET
BASOS PCT: 1 %
Basophils Absolute: 0.1 10*3/uL (ref 0.0–0.1)
EOS ABS: 0.3 10*3/uL (ref 0.0–0.7)
EOS PCT: 2 %
HCT: 21.1 % — ABNORMAL LOW (ref 36.0–46.0)
Hemoglobin: 7 g/dL — ABNORMAL LOW (ref 12.0–15.0)
Lymphocytes Relative: 21 %
Lymphs Abs: 3.8 10*3/uL (ref 0.7–4.0)
MCH: 29.7 pg (ref 26.0–34.0)
MCHC: 33.2 g/dL (ref 30.0–36.0)
MCV: 89.4 fL (ref 78.0–100.0)
MONO ABS: 1.6 10*3/uL (ref 0.1–1.0)
Monocytes Relative: 9 %
Neutro Abs: 12.6 10*3/uL (ref 1.7–7.7)
Neutrophils Relative %: 67 %
PLATELETS: 709 10*3/uL — AB (ref 150–400)
RBC: 2.36 MIL/uL — ABNORMAL LOW (ref 3.87–5.11)
RDW: 15.4 % (ref 11.5–15.5)
WBC: 18.4 10*3/uL — AB (ref 4.0–10.5)

## 2018-05-11 LAB — CULTURE, BLOOD (ROUTINE X 2): Special Requests: ADEQUATE

## 2018-05-11 LAB — BASIC METABOLIC PANEL
ANION GAP: 7 (ref 5–15)
BUN: 8 mg/dL (ref 6–20)
CHLORIDE: 103 mmol/L (ref 98–111)
CO2: 28 mmol/L (ref 22–32)
Calcium: 7.8 mg/dL — ABNORMAL LOW (ref 8.9–10.3)
Creatinine, Ser: 0.32 mg/dL — ABNORMAL LOW (ref 0.44–1.00)
GFR calc Af Amer: >60 mL/min (ref >60)
GLUCOSE: 103 mg/dL — AB (ref 70–99)
POTASSIUM: 4.1 mmol/L (ref 3.5–5.1)
SODIUM: 138 mmol/L (ref 135–145)

## 2018-05-11 LAB — PROTIME-INR
INR: 1.05
PROTHROMBIN TIME: 13.6 s (ref 11.4–15.2)

## 2018-05-11 MED ORDER — ENOXAPARIN SODIUM 40 MG/0.4ML ~~LOC~~ SOLN
40.0000 mg | SUBCUTANEOUS | Status: DC
  Administered 2018-05-12 – 2018-05-19 (×8): 40 mg via SUBCUTANEOUS
  Filled 2018-05-11 (×8): qty 0.4

## 2018-05-11 MED ORDER — MIDAZOLAM HCL 2 MG/2ML IJ SOLN
INTRAMUSCULAR | Status: AC | PRN
  Administered 2018-05-11: 1 mg via INTRAVENOUS
  Administered 2018-05-11: 2 mg via INTRAVENOUS
  Administered 2018-05-11: 1 mg via INTRAVENOUS

## 2018-05-11 MED ORDER — NALOXONE HCL 0.4 MG/ML IJ SOLN
INTRAMUSCULAR | Status: AC
  Filled 2018-05-11: qty 1

## 2018-05-11 MED ORDER — FENTANYL CITRATE (PF) 100 MCG/2ML IJ SOLN
INTRAMUSCULAR | Status: AC
  Filled 2018-05-11: qty 2

## 2018-05-11 MED ORDER — FENTANYL CITRATE (PF) 100 MCG/2ML IJ SOLN
INTRAMUSCULAR | Status: AC | PRN
  Administered 2018-05-11 (×2): 50 ug via INTRAVENOUS

## 2018-05-11 MED ORDER — FLUMAZENIL 0.5 MG/5ML IV SOLN
INTRAVENOUS | Status: AC
  Filled 2018-05-11: qty 5

## 2018-05-11 MED ORDER — MIDAZOLAM HCL 2 MG/2ML IJ SOLN
INTRAMUSCULAR | Status: AC
  Filled 2018-05-11: qty 6

## 2018-05-11 MED ORDER — IOHEXOL 300 MG/ML  SOLN
100.0000 mL | Freq: Once | INTRAMUSCULAR | Status: AC | PRN
  Administered 2018-05-11: 100 mL via INTRAVENOUS

## 2018-05-11 MED ORDER — SODIUM CHLORIDE 0.9% FLUSH
5.0000 mL | Freq: Three times a day (TID) | INTRAVENOUS | Status: DC
  Administered 2018-05-11 – 2018-05-19 (×24): 5 mL

## 2018-05-11 NOTE — Plan of Care (Signed)

## 2018-05-11 NOTE — Progress Notes (Signed)
Referring Physician(s): Dr. Barry Dienes  Supervising Physician: Sandi Mariscal  Patient Status:  [facility] - In-pt  Chief Complaint: Intra-abdominal fluid collection  Subjective: Reports abdominal distention.  Can feel pressure in right and left lower abdomen.  TG drain sore but intact.   Allergies: Watermelon [citrullus vulgaris] and Amoxicillin  Medications: Prior to Admission medications   Medication Sig Start Date End Date Taking? Authorizing Provider  acetaminophen (TYLENOL) 500 MG tablet Take 500-1,000 mg by mouth daily as needed for moderate pain.   Yes [provider]  Ascorbic Acid (VITAMIN C) 1000 MG tablet Take 1,000 mg by mouth daily. w/Elderberry   Yes [provider]  Cholecalciferol (VITAMIN D-3) 5000 units TABS Take 1,500 Units by mouth daily.    Yes [provider]  IRON PO Take 1 tablet by mouth daily.   Yes [provider]  Misc Natural Products (SUPER GREENS) POWD Take 1 Scoop by mouth daily.   Yes [provider]  Probiotic Product (PROBIOTIC PO) Take 1 capsule by mouth daily.   Yes [provider]  traMADol (ULTRAM) 50 MG tablet Take 1-2 tablets (50-100 mg total) by mouth every 6 (six) hours as needed. 05/03/18  Yes Armandina Gemma, MD  Diindolylmethane POWD Take 1 capsule by mouth daily. (DIM)    [provider]  EVENING PRIMROSE OIL PO Take 1,200 mg by mouth daily.    [provider]  nitrofurantoin, macrocrystal-monohydrate, (MACROBID) 100 MG capsule Take 1 capsule (100 mg total) by mouth 2 (two) times daily. 1 po BId Patient not taking: Reported on 05/06/2018 03/21/18   Dettinger, Fransisca Kaufmann, MD  Omega-3 Fatty Acids (FISH OIL OMEGA-3 PO) Take 2 capsules by mouth daily.     [provider]  RESVERATROL PO Take 1 capsule by mouth daily.     [provider]  SODIUM BICARBONATE PO Take 2 capsules by mouth at bedtime.    [provider]     Vital Signs: BP (!) 123/59   Pulse  (!) 117   Temp 99 F (37.2 C) (Oral)   Resp 16   Ht 5\' 4"  (1.626 m)   Wt 130 lb (59 kg)   LMP 04/29/2018   SpO2 99%   BMI 22.31 kg/m   Physical Exam  NAD, alert Abdomen:  TG drain in place.  Insertion site c/d/i.  Significant amount of dark, bloody output in bulb.  Soft, but distended especially across lower abdomen.   Imaging: Ct Abdomen Pelvis W Contrast  Result Date: 05/11/2018 CLINICAL DATA:  42 year old female post resection of abdominal mass. Postoperative infections requiring percutaneous drainage. Elevated white count. Assess for residual abscess. Subsequent encounter. EXAM: CT ABDOMEN AND PELVIS WITH CONTRAST TECHNIQUE: Multidetector CT imaging of the abdomen and pelvis was performed using the standard protocol following bolus administration of intravenous contrast. CONTRAST:  165mL OMNIPAQUE IOHEXOL 300 MG/ML  SOLN COMPARISON:  05/07/2018, 05/06/2018 and 02/28/2018 CT. FINDINGS: Lower chest: Basilar subsegmental atelectasis. Very small pleural effusion greater on left. Heart size within normal limits. Hepatobiliary: Possible small focal area of fatty infiltration adjacent to the fissure for the falciform ligament. Elongated liver. Gallstones measuring up to 2 cm. Cannot evaluate for gallbladder inflammation given the surrounding free fluid. Pancreas: No worrisome primary pancreatic mass or inflammation. Spleen: No splenic mass or enlargement. Adrenals/Urinary Tract: Right double-J ureteral stent is in place. Although there is persistent mild prominence of the right renal collecting system, this has improved slightly when compared to prior exam. Subtle wedge-shaped opacity  inferior right kidney. Small area of pyelonephritis not excluded. No worrisome left renal or adrenal lesion. Mild adrenal gland hyperplasia. Noncontrast filled imaging of the urinary bladder reveals right-sided ureteral stent in place. Compression of the dome and posterior aspect by abscess. Stomach/Bowel: Post right  colectomy. Decrease in degree of previously noted small bowel dilation. Portions of small bowel and colon with inflammation, possibly secondarily inflamed from surrounding abscesses. Primary bowel inflammation not excluded. Vascular/Lymphatic: No abdominal aortic aneurysm or large vessel occlusion. Portal veins, splenic veins and superior mesenteric vein are patent. Increase number of normal/top-normal size lymph nodes possibly reactive in origin. Reproductive: Inflammatory process surrounds portions of the uterus and adnexa limiting evaluation. Other: Right transgluteal drain placed in pre rectal/posterior uterine complex collection which has decreased in size now measuring 8.5 x 3.9 x 5.8 cm versus prior 11.9 x 8.2 x 10.2 cm. Post right colectomy. Posterior to surgical clips is a complex fluid collection (possibly containing blood) which measures 6.5 x 8.2 x 6.9 cm versus prior 6.4 x 8 x 6.4 cm. Along the lateral and inferior margin, this communicates with a oblong collection which extends along the right lateral pelvic wall spanning over 11.8 x 3.8 x 3.7 cm versus prior 11.4 x 3.6 x 3.7 cm. Bilobed complex fluid collection (possibly containing blood) along the dome of the bladder is larger on the left measuring 10.5 x 5.9 x 5.8 cm versus prior 10.6 x 5.7 x 5.7 cm. Complex fluid collection along the left lateral lower abdomen/pelvis spans over 6 x 4 x 3 cm versus prior 5.8 x 3.8 x 3 cm. Diffuse hazy infiltration fat planes greater in the pelvis. Loculated free intraperitoneal air. The volume of free intraperitoneal air has decreased slightly compared to prior examination and may represent gas related to recent procedure. Persistent leak secondary less likely consideration although not excluded and attention to this on close follow-up recommended. Mild third spacing of fluid. Postoperative changes periumbilical region where supraumbilical 1.5 cm collection is noted (possibly laparoscopy site). Musculoskeletal:  Degenerative changes lower thoracic and lower lumbar spine. Mild impression upon the anterior aspect of the right psoas muscle by abscess. Superficial extension into the psoas muscle not excluded. IMPRESSION: 1. Right transgluteal drain placed in pre rectal/posterior uterine complex collection which has decreased in size now measuring 8.5 x 3.9 x 5.8 cm versus prior 11.9 x 8.2 x 10.2 cm. 2. Post right colectomy. Posterior to surgical clips is a complex fluid collection (possibly containing blood) which measures 6.5 x 8.2 x 6.9 cm versus prior 6.4 x 8 x 6.4 cm. This possible abscess impresses upon the anterior margin of the psoas muscle and may have superficial extension into such. Immediately adjacent to this complex fluid collection is an oblong slightly complex fluid collection which extends along the right lateral pelvic wall spanning over 11.8 x 3.8 x 3.7 cm versus prior 11.4 x 3.6 x 3.7 cm. 3. Bilobed complex fluid collection (possibly containing blood) along the dome of the bladder is larger on the left measuring 10.5 x 5.9 x 5.8 cm versus prior 10.6 x 5.7 x 5.7 cm. 4. Complex fluid collection along the left lateral lower abdomen/pelvis spans over 6 x 4 x 3 cm versus prior 5.8 x 3.8 x 3 cm. 5. Decrease in degree of small bowel distension. Residual inflammation of small bowel and colon may reflect result of reaction to surrounding abscesses although primary bowel inflammatory process not excluded. 6. Diffuse hazy infiltration fat planes greater in the pelvis. 7. Loculated free  intraperitoneal air. The volume of free intraperitoneal air has decreased slightly compared to prior examination and may represent gas related to recent procedure. Persistent leak secondary less likely consideration although not excluded and attention to this on close follow-up recommended. 8. Mild third spacing of fluid. 9. Postoperative changes periumbilical region where supraumbilical 1.5 cm collection is noted (possibly laparoscopy  site). 10. Gallstones measuring up to 2 cm. Cannot evaluate for gallbladder inflammation given the surrounding free fluid. 11. Right double-J ureteral stent is in place. Although there is persistent mild prominence of the right renal collecting system, this has improved slightly when compared to prior exam. Subtle wedge-shaped opacity inferior right kidney. Small area of pyelonephritis not excluded. Call is into Dr. Barry Dienes. Electronically Signed   By: Genia Del M.D.   On: 05/11/2018 10:11   Ct Image Guided Drainage By Percutaneous Catheter  Result Date: 05/07/2018 INDICATION: 42 year old female with a history of pelvic fluid collection. EXAM: CT-GUIDED DRAINAGE OF PELVIC FLUID MEDICATIONS: The patient is currently admitted to the hospital and receiving intravenous antibiotics. The antibiotics were administered within an appropriate time frame prior to the initiation of the procedure. ANESTHESIA/SEDATION: 4.0 mg IV Versed 100 mcg IV Fentanyl Moderate Sedation Time:  10 minutes The patient was continuously monitored during the procedure by the interventional radiology nurse under my direct supervision. COMPLICATIONS: None TECHNIQUE: Informed written consent was obtained from the patient after a thorough discussion of the procedural risks, benefits and alternatives. All questions were addressed. Maximal Sterile Barrier Technique was utilized including caps, mask, sterile gowns, sterile gloves, sterile drape, hand hygiene and skin antiseptic. A timeout was performed prior to the initiation of the procedure. PROCEDURE: The right gluteal region was prepped with chlorhexidine in a sterile fashion, and a sterile drape was applied covering the operative field. A sterile gown and sterile gloves were used for the procedure. Local anesthesia was provided with 1% Lidocaine. Using CT guidance, modified Seldinger technique was used to place a right transgluteal 12 [place] drain. Approximately 350 cc of dark bloody fluid  aspirated. Catheter was sutured in position attached to bulb suction. Sample sent for analysis. Patient tolerated the procedure well and remained hemodynamically stable throughout. No complications were encountered and no significant blood loss. FINDINGS: Abscess of the pelvis treated with right trans gluteal drain. IMPRESSION: Status post right transgluteal 12 French drainage of pelvic fluid collection. Sample sent for culture. Signed, Dulcy Fanny. Dellia Nims, RPVI [facility] Electronically Signed   By: Corrie Mckusick D.O.   On: 05/07/2018 18:00    Labs:  CBC: Recent Labs    05/08/18 0509 05/09/18 0404 05/10/18 0432 05/11/18 0440  WBC 17.0* 22.8* 21.8* 18.4*  HGB 7.3* 7.6* 7.1* 7.0*  HCT 21.9* 22.7* 21.7* 21.1*  PLT 574* 669* 703* 709*    COAGS: Recent Labs    05/07/18 1338  INR 1.08    BMP: Recent Labs    05/08/18 0509 05/09/18 0404 05/10/18 0432 05/11/18 0440  NA 134* 139 141 138  K 3.9 3.8 3.8 4.1  CL 101 102 105 103  CO2 25 29 29 28   GLUCOSE 131* 106* 101* 103*  BUN 11 10 9 8   CALCIUM 7.4* 7.8* 7.9* 7.8*  CREATININE 0.37* 0.46 0.36* 0.32*  GFRNONAA >60 >60 >60 >60  GFRAA >60 >60 >60 >60    LIVER FUNCTION TESTS: Recent Labs    02/10/18 1037 05/06/18 2104  BILITOT 0.3 1.0  AST 16 26  ALT 14 18  ALKPHOS 68 76  PROT 7.6 6.7  ALBUMIN 4.4 2.5*    Assessment and Plan: Intra-abdominal fluid collections S/p right TG Patient s/p right colectomy 04/30/18.  Returned to ED with abdominal pain and fevers 9/19.  She underwent drain placement by Dr. Earleen Newport 9/19.   Repeat CT Abdomen today showed: 1. Right transgluteal drain placed in pre rectal/posterior uterine complex collection which has decreased in size now measuring 8.5 x 3.9 x 5.8 cm versus prior 11.9 x 8.2 x 10.2 cm. 2. Post right colectomy. Posterior to surgical clips is a complex fluid collection (possibly containing blood) which measures 6.5 x 8.2 x  6.9 cm versus prior 6.4 x 8 x 6.4 cm. This possible abscess impresses upon the anterior margin of the psoas muscle and may have superficial extension into such. Immediately adjacent to this complex fluid collection is an oblong slightly complex fluid collection which extends along the right lateral pelvic wall spanning over 11.8 x 3.8 x 3.7 cm versus prior 11.4 x 3.6 x 3.7 cm. 3. Bilobed complex fluid collection (possibly containing blood) along the dome of the bladder is larger on the left measuring 10.5 x 5.9 x 5.8 cm versus prior 10.6 x 5.7 x 5.7 cm. 4. Complex fluid collection along the left lateral lower abdomen/pelvis spans over 6 x 4 x 3 cm versus prior 5.8 x 3.8 x 3 cm. 5. Decrease in degree of small bowel distension. Residual inflammation of small bowel and colon may reflect result of reaction to surrounding abscesses although primary bowel inflammatory process not excluded. 6. Diffuse hazy infiltration fat planes greater in the pelvis. 7. Loculated free intraperitoneal air. The volume of free intraperitoneal air has decreased slightly compared to prior examination and may represent gas related to recent procedure. Persistent leak secondary less likely consideration although not excluded and attention to this on close follow-up recommended. 8. Mild third spacing of fluid. 9. Postoperative changes periumbilical region where supraumbilical 1.5 cm collection is noted (possibly laparoscopy site). 10. Gallstones measuring up to 2 cm. Cannot evaluate for gallbladder inflammation given the surrounding free fluid. 11. Right double-J ureteral stent is in place. Although there is persistent mild prominence of the right renal collecting system, this has improved slightly when compared to prior exam. Subtle wedge-shaped opacity inferior right kidney. Small area of pyelonephritis not excluded.  Dr. Pascal Lux has discussed case with Dr. Barry Dienes. Additional drains to be placed in residual fluid collections.    Patient has been NPO today.  INR ordered.   Risks and benefits discussed with the patient including bleeding, infection, damage to adjacent structures, bowel perforation/fistula connection, and sepsis.  All of the patient's questions were answered, patient is agreeable to proceed. Consent signed and in chart.  Electronically Signed: Docia Barrier, PA 05/11/2018, 1:43 PM   I spent a total of 15 Minutes at the the patient's bedside AND on the patient's hospital floor or unit, greater than 50% of which was counseling/coordinating care for intra-abdominal fluid collections.

## 2018-05-11 NOTE — Procedures (Signed)
Pre procedural Dx: Post-op abscess Post procedural Dx: Same  Technically successful CT guided placed of a 12 Fr drainage catheter placement into the right lower abdominal quadrant yielding 100 cc of bloody fluid.   Technically successful CT guided placed of a 12 Fr drainage catheter placement into the left lower abdominal quadrant/pelvis yielding 100 cc of bloody fluid.    A sample of aspirated fluid was sent to the laboratory for analysis.    EBL: Minimal  Complications: None immediate  Ronny Bacon, MD Pager #: [phone]

## 2018-05-11 NOTE — Progress Notes (Signed)
Patient ID: Erica Ross, female   DOB: 03-05-1976, 42 y.o.   MRN: [id number] [facility] Surgery Progress Note:   * No surgery found *   Subjective: Doing OK, but a bit bloated.    Objective: Vital signs in last 24 hours: Temp:  [98.2 F (36.8 C)-99 F (37.2 C)] 99 F (37.2 C) (09/23 0542) Pulse Rate:  [108-117] 117 (09/23 0542) Resp:  [16-17] 16 (09/23 0542) BP: (116-123)/(59-66) 123/59 (09/23 0542) SpO2:  [99 %] 99 % (09/23 0542)  Intake/Output from previous day: 09/22 0701 - 09/23 0700 In: 2290.7 [P.O.:900; I.V.:257.4; IV Piggyback:1125.3] Out: 25 [Drains:25] Intake/Output this shift: No intake/output data recorded.  Physical Exam:  A&O x 3.  NAD Resp:  Breathing comfortably GI:  abd soft, + distended, non tender. JP with old blood.    Lab Results:  Results for orders placed or performed during the hospital encounter of 05/06/18 (from the past 48 hour(s))  CBC     Status: Abnormal   Collection Time: 05/10/18  4:32 AM  Result Value Ref Range   WBC 21.8 (H) 4.0 - 10.5 K/uL   RBC 2.43 (L) 3.87 - 5.11 MIL/uL   Hemoglobin 7.1 (L) 12.0 - 15.0 g/dL   HCT 21.7 (L) 36.0 - 46.0 %   MCV 89.3 78.0 - 100.0 fL   MCH 29.2 26.0 - 34.0 pg   MCHC 32.7 30.0 - 36.0 g/dL   RDW 15.1 11.5 - 15.5 %   Platelets 703 (H) 150 - 400 K/uL    Comment: Performed at [facility], [place] [address], [place], [place] [address]  Basic metabolic panel     Status: Abnormal   Collection Time: 05/10/18  4:32 AM  Result Value Ref Range   Sodium 141 135 - 145 mmol/L   Potassium 3.8 3.5 - 5.1 mmol/L   Chloride 105 98 - 111 mmol/L   CO2 29 22 - 32 mmol/L   Glucose, Bld 101 (H) 70 - 99 mg/dL   BUN 9 6 - 20 mg/dL   Creatinine, Ser 0.36 (L) 0.44 - 1.00 mg/dL   Calcium 7.9 (L) 8.9 - 10.3 mg/dL   GFR calc non Af Amer >60 >60 mL/min   GFR calc Af Amer >60 >60 mL/min    Comment: (NOTE) The eGFR has been calculated using the CKD EPI equation. This calculation has not been validated  in all clinical situations. eGFR's persistently <60 mL/min signify possible Chronic Kidney Disease.    Anion gap 7 5 - 15    Comment: Performed at [facility], [place] [address], [place], [place] [address]  Basic metabolic panel     Status: Abnormal   Collection Time: 05/11/18  4:40 AM  Result Value Ref Range   Sodium 138 135 - 145 mmol/L   Potassium 4.1 3.5 - 5.1 mmol/L   Chloride 103 98 - 111 mmol/L   CO2 28 22 - 32 mmol/L   Glucose, Bld 103 (H) 70 - 99 mg/dL   BUN 8 6 - 20 mg/dL   Creatinine, Ser 0.32 (L) 0.44 - 1.00 mg/dL   Calcium 7.8 (L) 8.9 - 10.3 mg/dL   GFR calc non Af Amer >60 >60 mL/min   GFR calc Af Amer >60 >60 mL/min    Comment: (NOTE) The eGFR has been calculated using the CKD EPI equation. This calculation has not been validated in all clinical situations. eGFR's persistently <60 mL/min signify possible Chronic Kidney Disease.    Anion gap 7  5 - 15    Comment: Performed at [facility], [place] [address] [place], [place] [address]  CBC with Differential/Platelet     Status: Abnormal   Collection Time: 05/11/18  4:40 AM  Result Value Ref Range   WBC 18.4 (H) 4.0 - 10.5 K/uL   RBC 2.36 (L) 3.87 - 5.11 MIL/uL   Hemoglobin 7.0 (L) 12.0 - 15.0 g/dL   HCT 21.1 (L) 36.0 - 46.0 %   MCV 89.4 78.0 - 100.0 fL   MCH 29.7 26.0 - 34.0 pg   MCHC 33.2 30.0 - 36.0 g/dL   RDW 15.4 11.5 - 15.5 %   Platelets 709 (H) 150 - 400 K/uL   Neutrophils Relative % 67 %   Neutro Abs 12.6 1.7 - 7.7 K/uL   Lymphocytes Relative 21 %   Lymphs Abs 3.8 0.7 - 4.0 K/uL   Monocytes Relative 9 %   Monocytes Absolute 1.6 0.1 - 1.0 K/uL   Eosinophils Relative 2 %   Eosinophils Absolute 0.3 0.0 - 0.7 K/uL   Basophils Relative 1 %   Basophils Absolute 0.1 0.0 - 0.1 K/uL   WBC Morphology MILD LEFT SHIFT (1-5% METAS, OCC MYELO, OCC BANDS)    RBC Morphology TARGET CELLS     Comment: POLYCHROMASIA PRESENT Performed at [facility], [place]  [address], [place], [place] [address]     Radiology/Results: No results found.  Anti-infectives: Anti-infectives (From admission, onward)   Start     Dose/Rate Route Frequency Ordered Stop   05/07/18 0630  metroNIDAZOLE (FLAGYL) IVPB 500 mg     500 mg 100 mL/hr over 60 Minutes Intravenous Every 8 hours 05/07/18 0144     05/07/18 0300  cefTRIAXone (ROCEPHIN) 2 g in sodium chloride 0.9 % 100 mL IVPB     2 g 200 mL/hr over 30 Minutes Intravenous Daily at bedtime 05/07/18 0144     05/06/18 2300  ceFEPIme (MAXIPIME) 2 g in sodium chloride 0.9 % 100 mL IVPB     2 g 200 mL/hr over 30 Minutes Intravenous  Once 05/06/18 2256 05/06/18 2346   05/06/18 2300  metroNIDAZOLE (FLAGYL) IVPB 500 mg     500 mg 100 mL/hr over 60 Minutes Intravenous  Once 05/06/18 2256 05/06/18 2355      Assessment/Plan: Problem List: Patient Active Problem List   Diagnosis Date Noted  . Post-operative infection 05/07/2018  . 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 Adnexal mass 03/17/2018  . Hydronephrosis 03/17/2018    Repeat CT today. ? Need for additional drain NPO until scan results back. ABL anemia - stable.   B frag + blood cx.  On flagyl and ceftriaxone.   LOS: 4 days    [facility] [phone]  05/11/2018 8:56 AM

## 2018-05-11 NOTE — Progress Notes (Signed)
MEDICATION-RELATED CONSULT NOTE   IR Procedure Consult - Anticoagulant/Antiplatelet PTA/Inpatient Med List Review by Pharmacist    Procedure: CT guided drain placement    Completed: 05/11/18 @ 16:52  Post-Procedural bleeding risk per IR MD assessment:  STANDARD  Antithrombotic medications on inpatient or PTA profile prior to procedure:   Enoxaparin 40mg  sq q24h for VTE prophylaxis    Recommended restart time per IR Post-Procedure Guidelines:  Day + 1 (Next AM)   Plan:    Resume Enoxaparin 40mg  sq q24h on 05/12/18 @ 10:00  Leone Haven, PharmD

## 2018-05-12 ENCOUNTER — Encounter: Payer: Self-pay | Admitting: Genetics

## 2018-05-12 LAB — BASIC METABOLIC PANEL
Anion gap: 9 (ref 5–15)
BUN: 5 mg/dL — ABNORMAL LOW (ref 6–20)
CHLORIDE: 101 mmol/L (ref 98–111)
CO2: 26 mmol/L (ref 22–32)
Calcium: 8 mg/dL — ABNORMAL LOW (ref 8.9–10.3)
Creatinine, Ser: 0.35 mg/dL — ABNORMAL LOW (ref 0.44–1.00)
GFR calc non Af Amer: >60 mL/min (ref >60)
Glucose, Bld: 109 mg/dL — ABNORMAL HIGH (ref 70–99)
Potassium: 4.3 mmol/L (ref 3.5–5.1)
SODIUM: 136 mmol/L (ref 135–145)

## 2018-05-12 LAB — CBC
HCT: 21.7 % — ABNORMAL LOW (ref 36.0–46.0)
HEMOGLOBIN: 7.1 g/dL — AB (ref 12.0–15.0)
MCH: 29.2 pg (ref 26.0–34.0)
MCHC: 32.7 g/dL (ref 30.0–36.0)
MCV: 89.3 fL (ref 78.0–100.0)
Platelets: 836 10*3/uL — ABNORMAL HIGH (ref 150–400)
RBC: 2.43 MIL/uL — AB (ref 3.87–5.11)
RDW: 15.3 % (ref 11.5–15.5)
WBC: 17.5 10*3/uL — ABNORMAL HIGH (ref 4.0–10.5)

## 2018-05-12 MED ORDER — PREMIER PROTEIN SHAKE
11.0000 [oz_av] | Freq: Two times a day (BID) | ORAL | Status: DC
  Administered 2018-05-13 – 2018-05-19 (×12): 11 [oz_av] via ORAL
  Filled 2018-05-12: qty 325.31

## 2018-05-12 NOTE — Plan of Care (Signed)
Pt alert and oriented, very weak and complaints of generalized not feeling well.  Pt to be moved to rm [id number], RN will monitor.

## 2018-05-12 NOTE — Progress Notes (Signed)
Patient ID: Erica Ross, female   DOB: 1975/12/27, 42 y.o.   MRN: [id number] [facility] Surgery Progress Note:   * No surgery found *   Subjective: Feeling bad this AM. More pain today. Had a low grade temp last night.  Got 2 additional drains.    Objective: Vital signs in last 24 hours: Temp:  [99.5 F (37.5 C)-100.1 F (37.8 C)] 99.5 F (37.5 C) (09/24 0509) Pulse Rate:  [109-124] 116 (09/24 0509) Resp:  [14-22] 19 (09/24 0509) BP: (103-118)/(54-71) 117/71 (09/24 0509) SpO2:  [99 %-100 %] 99 % (09/24 0509)  Intake/Output from previous day: 09/23 0701 - 09/24 0700 In: 1473.2 [P.O.:680; I.V.:579; IV Piggyback:199.2] Out: 140 [Drains:140] Intake/Output this shift: No intake/output data recorded.  Physical Exam:  A&O x 3.  NAD.  Washcloth on forehead.  Pale. Resp:  Breathing comfortably GI:  abd soft, less distended. JP with old blood.    Lab Results:  Results for orders placed or performed during the hospital encounter of 05/06/18 (from the past 48 hour(s))  Basic metabolic panel     Status: Abnormal   Collection Time: 05/11/18  4:40 AM  Result Value Ref Range   Sodium 138 135 - 145 mmol/L   Potassium 4.1 3.5 - 5.1 mmol/L   Chloride 103 98 - 111 mmol/L   CO2 28 22 - 32 mmol/L   Glucose, Bld 103 (H) 70 - 99 mg/dL   BUN 8 6 - 20 mg/dL   Creatinine, Ser 0.32 (L) 0.44 - 1.00 mg/dL   Calcium 7.8 (L) 8.9 - 10.3 mg/dL   GFR calc non Af Amer >60 >60 mL/min   GFR calc Af Amer >60 >60 mL/min    Comment: (NOTE) The eGFR has been calculated using the CKD EPI equation. This calculation has not been validated in all clinical situations. eGFR's persistently <60 mL/min signify possible Chronic Kidney Disease.    Anion gap 7 5 - 15    Comment: Performed at [facility], [place] [address] [place], [place] [address]  CBC with Differential/Platelet     Status: Abnormal   Collection Time: 05/11/18  4:40 AM  Result Value Ref Range   WBC 18.4 (H) 4.0 - 10.5  K/uL   RBC 2.36 (L) 3.87 - 5.11 MIL/uL   Hemoglobin 7.0 (L) 12.0 - 15.0 g/dL   HCT 21.1 (L) 36.0 - 46.0 %   MCV 89.4 78.0 - 100.0 fL   MCH 29.7 26.0 - 34.0 pg   MCHC 33.2 30.0 - 36.0 g/dL   RDW 15.4 11.5 - 15.5 %   Platelets 709 (H) 150 - 400 K/uL   Neutrophils Relative % 67 %   Neutro Abs 12.6 1.7 - 7.7 K/uL   Lymphocytes Relative 21 %   Lymphs Abs 3.8 0.7 - 4.0 K/uL   Monocytes Relative 9 %   Monocytes Absolute 1.6 0.1 - 1.0 K/uL   Eosinophils Relative 2 %   Eosinophils Absolute 0.3 0.0 - 0.7 K/uL   Basophils Relative 1 %   Basophils Absolute 0.1 0.0 - 0.1 K/uL   WBC Morphology MILD LEFT SHIFT (1-5% METAS, OCC MYELO, OCC BANDS)    RBC Morphology TARGET CELLS     Comment: POLYCHROMASIA PRESENT Performed at [facility], [place] [address], [place], [place] [address]   Protime-INR     Status: None   Collection Time: 05/11/18  2:34 PM  Result Value Ref Range   Prothrombin Time 13.6 11.4 - 15.2 seconds  INR 1.05     Comment: Performed at [facility], [place] [address] [place], [place] [address]  Aerobic/Anaerobic Culture (surgical/deep wound)     Status: None (Preliminary result)   Collection Time: 05/11/18  4:54 PM  Result Value Ref Range   Specimen Description      WOUND Performed at [facility] [address], [place], [place] [address]    Special Requests      Normal Performed at [facility], [place] [address], [place], [place] [address]    Gram Stain      ABUNDANT WBC PRESENT,BOTH PMN AND MONONUCLEAR RARE GRAM POSITIVE COCCI RARE GRAM POSITIVE RODS RARE GRAM VARIABLE ROD    Culture      NO GROWTH < 12 HOURS Performed at [facility], [place] [address] [place], [place] [address]    Report Status PENDING   CBC     Status: Abnormal   Collection Time: 05/12/18  4:37 AM  Result Value Ref Range   WBC 17.5 (H) 4.0 - 10.5 K/uL   RBC 2.43 (L) 3.87 - 5.11 MIL/uL   Hemoglobin 7.1 (L) 12.0 -  15.0 g/dL   HCT 21.7 (L) 36.0 - 46.0 %   MCV 89.3 78.0 - 100.0 fL   MCH 29.2 26.0 - 34.0 pg   MCHC 32.7 30.0 - 36.0 g/dL   RDW 15.3 11.5 - 15.5 %   Platelets 836 (H) 150 - 400 K/uL    Comment: Performed at [facility] [place] [address] [place], [place] [address]  Basic metabolic panel     Status: Abnormal   Collection Time: 05/12/18  4:37 AM  Result Value Ref Range   Sodium 136 135 - 145 mmol/L   Potassium 4.3 3.5 - 5.1 mmol/L   Chloride 101 98 - 111 mmol/L   CO2 26 22 - 32 mmol/L   Glucose, Bld 109 (H) 70 - 99 mg/dL   BUN 5 (L) 6 - 20 mg/dL   Creatinine, Ser 0.35 (L) 0.44 - 1.00 mg/dL   Calcium 8.0 (L) 8.9 - 10.3 mg/dL   GFR calc non Af Amer >60 >60 mL/min   GFR calc Af Amer >60 >60 mL/min    Comment: (NOTE) The eGFR has been calculated using the CKD EPI equation. This calculation has not been validated in all clinical situations. eGFR's persistently <60 mL/min signify possible Chronic Kidney Disease.    Anion gap 9 5 - 15    Comment: Performed at [facility], [place] [address], [place], [place] [address]    Radiology/Results: Ct Abdomen Pelvis W Contrast  Result Date: 05/11/2018 CLINICAL DATA:  42 year old female post resection of abdominal mass. Postoperative infections requiring percutaneous drainage. Elevated white count. Assess for residual abscess. Subsequent encounter. EXAM: CT ABDOMEN AND PELVIS WITH CONTRAST TECHNIQUE: Multidetector CT imaging of the abdomen and pelvis was performed using the standard protocol following bolus administration of intravenous contrast. CONTRAST:  167m OMNIPAQUE IOHEXOL 300 MG/ML  SOLN COMPARISON:  05/07/2018, 05/06/2018 and 02/28/2018 CT. FINDINGS: Lower chest: Basilar subsegmental atelectasis. Very small pleural effusion greater on left. Heart size within normal limits. Hepatobiliary: Possible small focal area of fatty infiltration adjacent to the fissure for the falciform ligament. Elongated liver.  Gallstones measuring up to 2 cm. Cannot evaluate for gallbladder inflammation given the surrounding free fluid. Pancreas: No worrisome primary pancreatic mass or inflammation. Spleen: No splenic mass or enlargement. Adrenals/Urinary Tract: Right double-J ureteral stent is in place.  Although there is persistent mild prominence of the right renal collecting system, this has improved slightly when compared to prior exam. Subtle wedge-shaped opacity inferior right kidney. Small area of pyelonephritis not excluded. No worrisome left renal or adrenal lesion. Mild adrenal gland hyperplasia. Noncontrast filled imaging of the urinary bladder reveals right-sided ureteral stent in place. Compression of the dome and posterior aspect by abscess. Stomach/Bowel: Post right colectomy. Decrease in degree of previously noted small bowel dilation. Portions of small bowel and colon with inflammation, possibly secondarily inflamed from surrounding abscesses. Primary bowel inflammation not excluded. Vascular/Lymphatic: No abdominal aortic aneurysm or large vessel occlusion. Portal veins, splenic veins and superior mesenteric vein are patent. Increase number of normal/top-normal size lymph nodes possibly reactive in origin. Reproductive: Inflammatory process surrounds portions of the uterus and adnexa limiting evaluation. Other: Right transgluteal drain placed in pre rectal/posterior uterine complex collection which has decreased in size now measuring 8.5 x 3.9 x 5.8 cm versus prior 11.9 x 8.2 x 10.2 cm. Post right colectomy. Posterior to surgical clips is a complex fluid collection (possibly containing blood) which measures 6.5 x 8.2 x 6.9 cm versus prior 6.4 x 8 x 6.4 cm. Along the lateral and inferior margin, this communicates with a oblong collection which extends along the right lateral pelvic wall spanning over 11.8 x 3.8 x 3.7 cm versus prior 11.4 x 3.6 x 3.7 cm. Bilobed complex fluid collection (possibly containing blood) along  the dome of the bladder is larger on the left measuring 10.5 x 5.9 x 5.8 cm versus prior 10.6 x 5.7 x 5.7 cm. Complex fluid collection along the left lateral lower abdomen/pelvis spans over 6 x 4 x 3 cm versus prior 5.8 x 3.8 x 3 cm. Diffuse hazy infiltration fat planes greater in the pelvis. Loculated free intraperitoneal air. The volume of free intraperitoneal air has decreased slightly compared to prior examination and may represent gas related to recent procedure. Persistent leak secondary less likely consideration although not excluded and attention to this on close follow-up recommended. Mild third spacing of fluid. Postoperative changes periumbilical region where supraumbilical 1.5 cm collection is noted (possibly laparoscopy site). Musculoskeletal: Degenerative changes lower thoracic and lower lumbar spine. Mild impression upon the anterior aspect of the right psoas muscle by abscess. Superficial extension into the psoas muscle not excluded. IMPRESSION: 1. Right transgluteal drain placed in pre rectal/posterior uterine complex collection which has decreased in size now measuring 8.5 x 3.9 x 5.8 cm versus prior 11.9 x 8.2 x 10.2 cm. 2. Post right colectomy. Posterior to surgical clips is a complex fluid collection (possibly containing blood) which measures 6.5 x 8.2 x 6.9 cm versus prior 6.4 x 8 x 6.4 cm. This possible abscess impresses upon the anterior margin of the psoas muscle and may have superficial extension into such. Immediately adjacent to this complex fluid collection is an oblong slightly complex fluid collection which extends along the right lateral pelvic wall spanning over 11.8 x 3.8 x 3.7 cm versus prior 11.4 x 3.6 x 3.7 cm. 3. Bilobed complex fluid collection (possibly containing blood) along the dome of the bladder is larger on the left measuring 10.5 x 5.9 x 5.8 cm versus prior 10.6 x 5.7 x 5.7 cm. 4. Complex fluid collection along the left lateral lower abdomen/pelvis spans over 6 x 4 x 3  cm versus prior 5.8 x 3.8 x 3 cm. 5. Decrease in degree of small bowel distension. Residual inflammation of small bowel and colon may reflect result of  reaction to surrounding abscesses although primary bowel inflammatory process not excluded. 6. Diffuse hazy infiltration fat planes greater in the pelvis. 7. Loculated free intraperitoneal air. The volume of free intraperitoneal air has decreased slightly compared to prior examination and may represent gas related to recent procedure. Persistent leak secondary less likely consideration although not excluded and attention to this on close follow-up recommended. 8. Mild third spacing of fluid. 9. Postoperative changes periumbilical region where supraumbilical 1.5 cm collection is noted (possibly laparoscopy site). 10. Gallstones measuring up to 2 cm. Cannot evaluate for gallbladder inflammation given the surrounding free fluid. 11. Right double-J ureteral stent is in place. Although there is persistent mild prominence of the right renal collecting system, this has improved slightly when compared to prior exam. Subtle wedge-shaped opacity inferior right kidney. Small area of pyelonephritis not excluded. Call is into Dr. Barry Dienes. Electronically Signed   By: Genia Del M.D.   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 Barry Dienes, however she returned to the emergency department with fever, tachycardia and weakness a subsequent CT scan performed 05/06/2018 demonstrating an indeterminate fluid collection within the pelvis for which the patient underwent percutaneous drainage catheter placement by Dr. Earleen Newport on 05/07/2018. Unfortunately, today's CT scan obtained for worsening abdominal pain and fever demonstrates interval increase in size of indeterminate fluid  collections within the right mid abdomen as well as the left lower abdomen/pelvis. As such, request made for additional percutaneous drainage catheter placement for infection source control purposes. EXAM: CT IMAGE GUIDED DRAINAGE BY PERCUTANEOUS CATHETER x2 COMPARISON:  CT abdomen pelvis-05/11/2018; 05/06/2018; 02/28/2018; CT guided right trans gluteal approach percutaneous drainage catheter placement - 05/07/2018 MEDICATIONS: The patient is currently admitted to the hospital and receiving intravenous antibiotics. The antibiotics were administered within an appropriate time frame prior to the initiation of the procedure. ANESTHESIA/SEDATION: Moderate (conscious) sedation was employed during this procedure. A total of Versed 3 mg and Fentanyl 100 mcg was administered intravenously. Moderate Sedation Time: 28 minutes. The patient's level of consciousness and vital signs were monitored continuously by radiology nursing throughout the procedure under my direct supervision. CONTRAST:  None COMPLICATIONS: None immediate. PROCEDURE: Informed written consent was obtained from the patient after a discussion of the risks, benefits and alternatives to treatment. The patient was placed supine on the CT gantry and a pre procedural CT was performed re-demonstrating the known abscess/fluid collection within the right mid hemiabdomen and left lower abdomen/pelvis. Dominant collection within the right mid hemiabdomen measured approximately 8.9 x 6.7 cm (image 50, series 2 while additional dominant collection within the left lower abdomen/pelvis measures at least 7.8 x 6.6 cm (image 87, series 2). The procedure was planned. A timeout was performed prior to the initiation of the procedure. The skin overlying the right mid lateral abdomen as well as the ventral aspect of the left lower abdomen/pelvis was prepped and draped in the usual sterile fashion. The overlying soft tissues were anesthetized with 1% lidocaine with epinephrine.  Appropriate trajectory was planned with the use of a 22 gauge spinal needle. 18 gauge trocar needles were advanced into both fluid collections and short Amplatz super stiff wires were coiled within both collections. Appropriate positioning was confirmed with a limited CT scan. Both tracks were serially dilated allowing placement of 12 [place] all-purpose drainage catheters. Appropriate positioning was confirmed with  a limited postprocedural CT scan. Approximately 100 cc of bloody fluid was aspirated from each of the fluid collections. Both drainage catheters were connected to JP bulbs and sutured in place. A representative sample was capped and sent to the laboratory for analysis. Dressings were applied. The patient tolerated the above procedures well without immediate post procedural complication. IMPRESSION: 1. Successful CT guided placement of a 12 French all purpose drain catheter into the dominant collection with the right mid hemiabdomen yielding 100 cc of bloody fluid. 2. Successful CT-guided placement of a 12 French all-purpose drainage catheter into the dominant fluid collection within the left lower abdomen/pelvis yielding 100 cc of bloody fluid. 3. A sample of aspirated fluid was capped and sent to the laboratory for analysis. Electronically Signed   By: Sandi Mariscal M.D.   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 Barry Dienes, however she returned to the emergency department with fever, tachycardia and weakness a subsequent CT scan performed 05/06/2018 demonstrating an indeterminate fluid collection within the pelvis for which the patient underwent percutaneous drainage catheter placement by Dr. Earleen Newport on 05/07/2018. Unfortunately, today's CT scan obtained for worsening abdominal pain and fever  demonstrates interval increase in size of indeterminate fluid collections within the right mid abdomen as well as the left lower abdomen/pelvis. As such, request made for additional percutaneous drainage catheter placement for infection source control purposes. EXAM: CT IMAGE GUIDED DRAINAGE BY PERCUTANEOUS CATHETER x2 COMPARISON:  CT abdomen pelvis-05/11/2018; 05/06/2018; 02/28/2018; CT guided right trans gluteal approach percutaneous drainage catheter placement - 05/07/2018 MEDICATIONS: The patient is currently admitted to the hospital and receiving intravenous antibiotics. The antibiotics were administered within an appropriate time frame prior to the initiation of the procedure. ANESTHESIA/SEDATION: Moderate (conscious) sedation was employed during this procedure. A total of Versed 3 mg and Fentanyl 100 mcg was administered intravenously. Moderate Sedation Time: 28 minutes. The patient's level of consciousness and vital signs were monitored continuously by radiology nursing throughout the procedure under my direct supervision. CONTRAST:  None COMPLICATIONS: None immediate. PROCEDURE: Informed written consent was obtained from the patient after a discussion of the risks, benefits and alternatives to treatment. The patient was placed supine on the CT gantry and a pre procedural CT was performed re-demonstrating the known abscess/fluid collection within the right mid hemiabdomen and left lower abdomen/pelvis. Dominant collection within the right mid hemiabdomen measured approximately 8.9 x 6.7 cm (image 50, series 2 while additional dominant collection within the left lower abdomen/pelvis measures at least 7.8 x 6.6 cm (image 87, series 2). The procedure was planned. A timeout was performed prior to the initiation of the procedure. The skin overlying the right mid lateral abdomen as well as the ventral aspect of the left lower abdomen/pelvis was prepped and draped in the usual sterile fashion. The overlying soft  tissues were anesthetized with 1% lidocaine with epinephrine. Appropriate trajectory was planned with the use of a 22 gauge spinal needle. 18 gauge trocar needles were advanced into both fluid collections and short Amplatz super stiff wires were coiled within both collections. Appropriate positioning was confirmed with a limited CT scan. Both tracks were serially dilated allowing placement of 12 [place] all-purpose drainage catheters. Appropriate positioning was confirmed with a limited postprocedural CT scan. Approximately 100 cc of bloody fluid was aspirated from each of the fluid collections.  Both drainage catheters were connected to JP bulbs and sutured in place. A representative sample was capped and sent to the laboratory for analysis. Dressings were applied. The patient tolerated the above procedures well without immediate post procedural complication. IMPRESSION: 1. Successful CT guided placement of a 12 French all purpose drain catheter into the dominant collection with the right mid hemiabdomen yielding 100 cc of bloody fluid. 2. Successful CT-guided placement of a 12 French all-purpose drainage catheter into the dominant fluid collection within the left lower abdomen/pelvis yielding 100 cc of bloody fluid. 3. A sample of aspirated fluid was capped and sent to the laboratory for analysis. Electronically Signed   By: Sandi Mariscal M.D.   On: 05/11/2018 17:37    Anti-infectives: Anti-infectives (From admission, onward)   Start     Dose/Rate Route Frequency Ordered Stop   05/07/18 0630  metroNIDAZOLE (FLAGYL) IVPB 500 mg     500 mg 100 mL/hr over 60 Minutes Intravenous Every 8 hours 05/07/18 0144     05/07/18 0300  cefTRIAXone (ROCEPHIN) 2 g in sodium chloride 0.9 % 100 mL IVPB     2 g 200 mL/hr over 30 Minutes Intravenous Daily at bedtime 05/07/18 0144     05/06/18 2300  ceFEPIme (MAXIPIME) 2 g in sodium chloride 0.9 % 100 mL IVPB     2 g 200 mL/hr over 30 Minutes Intravenous  Once 05/06/18 2256  05/06/18 2346   05/06/18 2300  metroNIDAZOLE (FLAGYL) IVPB 500 mg     500 mg 100 mL/hr over 60 Minutes Intravenous  Once 05/06/18 2256 05/06/18 2355      Assessment/Plan: Problem List: Patient Active Problem List   Diagnosis Date Noted  . Post-operative infection 05/07/2018  . 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 Adnexal mass 03/17/2018  . Hydronephrosis 03/17/2018    Drains.   Diet as tolerated.   ABL anemia - stable.   B frag + blood cx.  On flagyl and ceftriaxone.   LOS: 5 days    [facility] [phone]  05/12/2018 7:52 AM

## 2018-05-12 NOTE — Progress Notes (Signed)
Referring Physician(s): Dr. Barry Dienes  Supervising Physician: Sandi Mariscal  Patient Status:  [facility] - In-pt  Chief Complaint: Follow-up multiple intra-abdominal fluid collections with drains x 3 placed in IR  Subjective:  42 y/o F with past medica history significant for GERD, umbilical hernia, cholelithiasis, nephrolithiasis, cystitis, left breast mass and most recently right adnexal mass which was found to be obstructing her right ureter. She underwent a diagnostic laparoscopy, right hemicolectomy, repair of umbilical hernia on 6/64 with Dr. Barry Dienes as well as cystoscopy and right ureteral stent placement at the same time by Dr. Louis Meckel.   Patient was d/ced to home on 9/15 however she presented to ED on 9/18 with complaints of weakness, tachycardia, fever and bloating. CT abdomen/pelvis performed on 9/18 showed multiple loculated fluid collections throughout the abdomen worrisome for postoperative abscesses. IR was consulted for percutaneous drainage of these abscess and a right transgluteal drain was placed by Dr. Earleen Newport on 9/19. Cultures from this aspiration have grown bacteroides fragilis, which is the same growth from blood cultures.  A repeat CT scan was performed on 9/22 which showed residual fluid collections outside of previously placed transgluteal drain. Patient was again brought to IR and two additional drains were placed on 9/23 by Dr. Pascal Lux. Cultures from these drains are pending.   Patient reports she feels terrible today, she endorses abdominal pain, headache, intermittent nausea without vomiting. She reports minimal PO intake last night, none this morning. She denies flatus but does endorse small BM last night.  Allergies: Watermelon [citrullus vulgaris] and Amoxicillin  Medications: Prior to Admission medications   Medication Sig Start Date End Date Taking? Authorizing Provider  acetaminophen (TYLENOL) 500 MG tablet Take 500-1,000 mg by mouth daily as needed for moderate  pain.   Yes [provider]  Ascorbic Acid (VITAMIN C) 1000 MG tablet Take 1,000 mg by mouth daily. w/Elderberry   Yes [provider]  Cholecalciferol (VITAMIN D-3) 5000 units TABS Take 1,500 Units by mouth daily.    Yes [provider]  IRON PO Take 1 tablet by mouth daily.   Yes [provider]  Misc Natural Products (SUPER GREENS) POWD Take 1 Scoop by mouth daily.   Yes [provider]  Probiotic Product (PROBIOTIC PO) Take 1 capsule by mouth daily.   Yes [provider]  traMADol (ULTRAM) 50 MG tablet Take 1-2 tablets (50-100 mg total) by mouth every 6 (six) hours as needed. 05/03/18  Yes Armandina Gemma, MD  Diindolylmethane POWD Take 1 capsule by mouth daily. (DIM)    [provider]  EVENING PRIMROSE OIL PO Take 1,200 mg by mouth daily.    [provider]  nitrofurantoin, macrocrystal-monohydrate, (MACROBID) 100 MG capsule Take 1 capsule (100 mg total) by mouth 2 (two) times daily. 1 po BId Patient not taking: Reported on 05/06/2018 03/21/18   Dettinger, Fransisca Kaufmann, MD  Omega-3 Fatty Acids (FISH OIL OMEGA-3 PO) Take 2 capsules by mouth daily.     [provider]  RESVERATROL PO Take 1 capsule by mouth daily.     [provider]  SODIUM BICARBONATE PO Take 2 capsules by mouth at bedtime.    [provider]     Vital Signs: BP 113/73   Pulse (!) 111   Temp 98.9 F (37.2 C) (Oral)   Resp 19   Ht 5\' 4"  (1.626 m)   Wt 130 lb (59 kg)   LMP 04/29/2018   SpO2 98%   BMI 22.31 kg/m  Physical Exam  Constitutional: She is oriented to person, place, and time. No distress.  Cardiovascular: Regular rhythm and normal heart sounds.  tachycardic  Pulmonary/Chest: Effort normal and breath sounds normal.  Abdominal: Soft. She exhibits no distension. There is tenderness (diffuse).  Transgluteal drain with scant dark red output; insertion site clean, dry, intact. RLQ abdominal drain with ~ 20 cc dark red  output; insertion site clean, dry, intact. LLQ abdominal drain with ~20 cc dark red output; insertion site clean, dry, intact.  Neurological: She is alert and oriented to person, place, and time.  Skin: Skin is warm and dry. She is not diaphoretic.  Psychiatric: She has a normal mood and affect. Her behavior is normal.  Nursing note and vitals reviewed.   Imaging: Ct Abdomen Pelvis W Contrast  Result Date: 05/11/2018 CLINICAL DATA:  42 year old female post resection of abdominal mass. Postoperative infections requiring percutaneous drainage. Elevated white count. Assess for residual abscess. Subsequent encounter. EXAM: CT ABDOMEN AND PELVIS WITH CONTRAST TECHNIQUE: Multidetector CT imaging of the abdomen and pelvis was performed using the standard protocol following bolus administration of intravenous contrast. CONTRAST:  134mL OMNIPAQUE IOHEXOL 300 MG/ML  SOLN COMPARISON:  05/07/2018, 05/06/2018 and 02/28/2018 CT. FINDINGS: Lower chest: Basilar subsegmental atelectasis. Very small pleural effusion greater on left. Heart size within normal limits. Hepatobiliary: Possible small focal area of fatty infiltration adjacent to the fissure for the falciform ligament. Elongated liver. Gallstones measuring up to 2 cm. Cannot evaluate for gallbladder inflammation given the surrounding free fluid. Pancreas: No worrisome primary pancreatic mass or inflammation. Spleen: No splenic mass or enlargement. Adrenals/Urinary Tract: Right double-J ureteral stent is in place. Although there is persistent mild prominence of the right renal collecting system, this has improved slightly when compared to prior exam. Subtle wedge-shaped opacity inferior right kidney. Small area of pyelonephritis not excluded. No worrisome left renal or adrenal lesion. Mild adrenal gland hyperplasia. Noncontrast filled imaging of the urinary bladder reveals right-sided ureteral stent in place. Compression of the dome and posterior aspect by abscess.  Stomach/Bowel: Post right colectomy. Decrease in degree of previously noted small bowel dilation. Portions of small bowel and colon with inflammation, possibly secondarily inflamed from surrounding abscesses. Primary bowel inflammation not excluded. Vascular/Lymphatic: No abdominal aortic aneurysm or large vessel occlusion. Portal veins, splenic veins and superior mesenteric vein are patent. Increase number of normal/top-normal size lymph nodes possibly reactive in origin. Reproductive: Inflammatory process surrounds portions of the uterus and adnexa limiting evaluation. Other: Right transgluteal drain placed in pre rectal/posterior uterine complex collection which has decreased in size now measuring 8.5 x 3.9 x 5.8 cm versus prior 11.9 x 8.2 x 10.2 cm. Post right colectomy. Posterior to surgical clips is a complex fluid collection (possibly containing blood) which measures 6.5 x 8.2 x 6.9 cm versus prior 6.4 x 8 x 6.4 cm. Along the lateral and inferior margin, this communicates with a oblong collection which extends along the right lateral pelvic wall spanning over 11.8 x 3.8 x 3.7 cm versus prior 11.4 x 3.6 x 3.7 cm. Bilobed complex fluid collection (possibly containing blood) along the dome of the bladder is larger on the left measuring 10.5 x 5.9 x 5.8 cm versus prior 10.6 x 5.7 x 5.7 cm. Complex fluid collection along the left lateral lower abdomen/pelvis spans over 6 x 4 x 3 cm versus prior 5.8 x 3.8 x 3 cm. Diffuse hazy infiltration fat planes greater in the pelvis. Loculated free intraperitoneal air. The volume of free intraperitoneal air  has decreased slightly compared to prior examination and may represent gas related to recent procedure. Persistent leak secondary less likely consideration although not excluded and attention to this on close follow-up recommended. Mild third spacing of fluid. Postoperative changes periumbilical region where supraumbilical 1.5 cm collection is noted (possibly laparoscopy  site). Musculoskeletal: Degenerative changes lower thoracic and lower lumbar spine. Mild impression upon the anterior aspect of the right psoas muscle by abscess. Superficial extension into the psoas muscle not excluded. IMPRESSION: 1. Right transgluteal drain placed in pre rectal/posterior uterine complex collection which has decreased in size now measuring 8.5 x 3.9 x 5.8 cm versus prior 11.9 x 8.2 x 10.2 cm. 2. Post right colectomy. Posterior to surgical clips is a complex fluid collection (possibly containing blood) which measures 6.5 x 8.2 x 6.9 cm versus prior 6.4 x 8 x 6.4 cm. This possible abscess impresses upon the anterior margin of the psoas muscle and may have superficial extension into such. Immediately adjacent to this complex fluid collection is an oblong slightly complex fluid collection which extends along the right lateral pelvic wall spanning over 11.8 x 3.8 x 3.7 cm versus prior 11.4 x 3.6 x 3.7 cm. 3. Bilobed complex fluid collection (possibly containing blood) along the dome of the bladder is larger on the left measuring 10.5 x 5.9 x 5.8 cm versus prior 10.6 x 5.7 x 5.7 cm. 4. Complex fluid collection along the left lateral lower abdomen/pelvis spans over 6 x 4 x 3 cm versus prior 5.8 x 3.8 x 3 cm. 5. Decrease in degree of small bowel distension. Residual inflammation of small bowel and colon may reflect result of reaction to surrounding abscesses although primary bowel inflammatory process not excluded. 6. Diffuse hazy infiltration fat planes greater in the pelvis. 7. Loculated free intraperitoneal air. The volume of free intraperitoneal air has decreased slightly compared to prior examination and may represent gas related to recent procedure. Persistent leak secondary less likely consideration although not excluded and attention to this on close follow-up recommended. 8. Mild third spacing of fluid. 9. Postoperative changes periumbilical region where supraumbilical 1.5 cm collection is noted  (possibly laparoscopy site). 10. Gallstones measuring up to 2 cm. Cannot evaluate for gallbladder inflammation given the surrounding free fluid. 11. Right double-J ureteral stent is in place. Although there is persistent mild prominence of the right renal collecting system, this has improved slightly when compared to prior exam. Subtle wedge-shaped opacity inferior right kidney. Small area of pyelonephritis not excluded. Call is into Dr. Barry Dienes. Electronically Signed   By: Genia Del M.D.   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 Barry Dienes, however she returned to the emergency department with fever, tachycardia and weakness a subsequent CT scan performed 05/06/2018 demonstrating an indeterminate fluid collection within the pelvis for which the patient underwent percutaneous drainage catheter placement by Dr. Earleen Newport on 05/07/2018. Unfortunately, today's CT scan obtained for worsening abdominal pain and fever demonstrates interval increase in size of indeterminate fluid collections within the right mid abdomen as well as the left lower abdomen/pelvis. As such, request made for additional percutaneous drainage catheter placement for infection source control purposes. EXAM: CT IMAGE GUIDED DRAINAGE BY PERCUTANEOUS CATHETER x2 COMPARISON:  CT abdomen pelvis-05/11/2018; 05/06/2018; 02/28/2018; CT guided right trans gluteal approach percutaneous drainage catheter placement - 05/07/2018 MEDICATIONS: The patient  is currently admitted to the hospital and receiving intravenous antibiotics. The antibiotics were administered within an appropriate time frame prior to the initiation of the procedure. ANESTHESIA/SEDATION: Moderate (conscious) sedation was employed during this procedure. A total of Versed 3 mg and  Fentanyl 100 mcg was administered intravenously. Moderate Sedation Time: 28 minutes. The patient's level of consciousness and vital signs were monitored continuously by radiology nursing throughout the procedure under my direct supervision. CONTRAST:  None COMPLICATIONS: None immediate. PROCEDURE: Informed written consent was obtained from the patient after a discussion of the risks, benefits and alternatives to treatment. The patient was placed supine on the CT gantry and a pre procedural CT was performed re-demonstrating the known abscess/fluid collection within the right mid hemiabdomen and left lower abdomen/pelvis. Dominant collection within the right mid hemiabdomen measured approximately 8.9 x 6.7 cm (image 50, series 2 while additional dominant collection within the left lower abdomen/pelvis measures at least 7.8 x 6.6 cm (image 87, series 2). The procedure was planned. A timeout was performed prior to the initiation of the procedure. The skin overlying the right mid lateral abdomen as well as the ventral aspect of the left lower abdomen/pelvis was prepped and draped in the usual sterile fashion. The overlying soft tissues were anesthetized with 1% lidocaine with epinephrine. Appropriate trajectory was planned with the use of a 22 gauge spinal needle. 18 gauge trocar needles were advanced into both fluid collections and short Amplatz super stiff wires were coiled within both collections. Appropriate positioning was confirmed with a limited CT scan. Both tracks were serially dilated allowing placement of 12 [place] all-purpose drainage catheters. Appropriate positioning was confirmed with a limited postprocedural CT scan. Approximately 100 cc of bloody fluid was aspirated from each of the fluid collections. Both drainage catheters were connected to JP bulbs and sutured in place. A representative sample was capped and sent to the laboratory for analysis. Dressings were applied. The patient tolerated the above  procedures well without immediate post procedural complication. IMPRESSION: 1. Successful CT guided placement of a 12 French all purpose drain catheter into the dominant collection with the right mid hemiabdomen yielding 100 cc of bloody fluid. 2. Successful CT-guided placement of a 12 French all-purpose drainage catheter into the dominant fluid collection within the left lower abdomen/pelvis yielding 100 cc of bloody fluid. 3. A sample of aspirated fluid was capped and sent to the laboratory for analysis. Electronically Signed   By: Sandi Mariscal M.D.   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 Barry Dienes, however she returned to the emergency department with fever, tachycardia and weakness a subsequent CT scan performed 05/06/2018 demonstrating an indeterminate fluid collection within the pelvis for which the patient underwent percutaneous drainage catheter placement by Dr. Earleen Newport on 05/07/2018. Unfortunately, today's CT scan obtained for worsening abdominal pain and fever demonstrates interval increase in size of indeterminate fluid collections within the right mid abdomen as well as the left lower abdomen/pelvis. As such, request made for additional percutaneous drainage catheter placement for infection source control purposes. EXAM: CT IMAGE GUIDED DRAINAGE BY PERCUTANEOUS CATHETER x2 COMPARISON:  CT abdomen pelvis-05/11/2018; 05/06/2018; 02/28/2018; CT guided right trans gluteal approach percutaneous drainage catheter placement - 05/07/2018 MEDICATIONS: The patient is currently admitted to the hospital and receiving intravenous antibiotics. The antibiotics were administered within an appropriate time frame  prior to the initiation of the procedure. ANESTHESIA/SEDATION: Moderate (conscious) sedation was  employed during this procedure. A total of Versed 3 mg and Fentanyl 100 mcg was administered intravenously. Moderate Sedation Time: 28 minutes. The patient's level of consciousness and vital signs were monitored continuously by radiology nursing throughout the procedure under my direct supervision. CONTRAST:  None COMPLICATIONS: None immediate. PROCEDURE: Informed written consent was obtained from the patient after a discussion of the risks, benefits and alternatives to treatment. The patient was placed supine on the CT gantry and a pre procedural CT was performed re-demonstrating the known abscess/fluid collection within the right mid hemiabdomen and left lower abdomen/pelvis. Dominant collection within the right mid hemiabdomen measured approximately 8.9 x 6.7 cm (image 50, series 2 while additional dominant collection within the left lower abdomen/pelvis measures at least 7.8 x 6.6 cm (image 87, series 2). The procedure was planned. A timeout was performed prior to the initiation of the procedure. The skin overlying the right mid lateral abdomen as well as the ventral aspect of the left lower abdomen/pelvis was prepped and draped in the usual sterile fashion. The overlying soft tissues were anesthetized with 1% lidocaine with epinephrine. Appropriate trajectory was planned with the use of a 22 gauge spinal needle. 18 gauge trocar needles were advanced into both fluid collections and short Amplatz super stiff wires were coiled within both collections. Appropriate positioning was confirmed with a limited CT scan. Both tracks were serially dilated allowing placement of 12 [place] all-purpose drainage catheters. Appropriate positioning was confirmed with a limited postprocedural CT scan. Approximately 100 cc of bloody fluid was aspirated from each of the fluid collections. Both drainage catheters were connected to JP bulbs and sutured in place. A representative sample was capped and sent to the laboratory for  analysis. Dressings were applied. The patient tolerated the above procedures well without immediate post procedural complication. IMPRESSION: 1. Successful CT guided placement of a 12 French all purpose drain catheter into the dominant collection with the right mid hemiabdomen yielding 100 cc of bloody fluid. 2. Successful CT-guided placement of a 12 French all-purpose drainage catheter into the dominant fluid collection within the left lower abdomen/pelvis yielding 100 cc of bloody fluid. 3. A sample of aspirated fluid was capped and sent to the laboratory for analysis. Electronically Signed   By: Sandi Mariscal M.D.   On: 05/11/2018 17:37    Labs:  CBC: Recent Labs    05/09/18 0404 05/10/18 0432 05/11/18 0440 05/12/18 0437  WBC 22.8* 21.8* 18.4* 17.5*  HGB 7.6* 7.1* 7.0* 7.1*  HCT 22.7* 21.7* 21.1* 21.7*  PLT 669* 703* 709* 836*    COAGS: Recent Labs    05/07/18 1338 05/11/18 1434  INR 1.08 1.05    BMP: Recent Labs    05/09/18 0404 05/10/18 0432 05/11/18 0440 05/12/18 0437  NA 139 141 138 136  K 3.8 3.8 4.1 4.3  CL 102 105 103 101  CO2 29 29 28 26   GLUCOSE 106* 101* 103* 109*  BUN 10 9 8  5*  CALCIUM 7.8* 7.9* 7.8* 8.0*  CREATININE 0.46 0.36* 0.32* 0.35*  GFRNONAA >60 >60 >60 >60  GFRAA >60 >60 >60 >60    LIVER FUNCTION TESTS: Recent Labs    02/10/18 1037 05/06/18 2104  BILITOT 0.3 1.0  AST 16 26  ALT 14 18  ALKPHOS 68 76  PROT 7.6 6.7  ALBUMIN 4.4 2.5*    Assessment and Plan:  Multiple intra-abdominal abscesses following right hemicolectomy, umbilical hernia repair,  cystoscopy and right ureteral stent placement on 04/29/18 with Dr. Barry Dienes and Dr. Louis Meckel. Initial abscess drain was placed 9/19 by Dr. Earleen Newport and output has been steady with 110 cc in last 24 hours, some pain at insertion site but this has improved per patient. RLQ and LLQ abdominal drains were placed 9/23 by Dr. Pascal Lux, both have about 20 cc of dark red output and patient reports pain at  insertion sites.  Patient remains tachycardic, hypotensive, Tmax 100.1, H/H 7.1/21.7, WBC improved slightly to 17.5; culture of initial drain aspirate show bacteroides fragilis for which she is on IV ceftriaxone and metronidazole. Cultures from aspirate of drains placed yesterday in IR are still pending.   IR to follow while inpatient, will schedule follow up in outpatient IR clinic upon discharge. Discharge per admitting service. Continue TID flushes of all 3 drains with 5 cc of NS, record output for all drains.   Please call IR with questions or concerns.  Electronically Signed: Joaquim Nam, PA-C 05/12/2018, 9:50 AM   I spent a total of 25 Minutes at the the patient's bedside AND on the patient's hospital floor or unit, greater than 50% of which was counseling/coordinating care for intra-abdominal abscess drains x 3.

## 2018-05-12 NOTE — Progress Notes (Signed)
Nutrition Follow-up  INTERVENTION:   Continue Premier Protein BID, each supplement provides 160 kcal and 30 grams of protein.   NUTRITION DIAGNOSIS:   Increased nutrient needs related to post-op healing as evidenced by estimated needs.  Ongoing.  GOAL:   Patient will meet greater than or equal to 90% of their needs  Progressing.  MONITOR:   PO intake, Supplement acceptance, Weight trends, Labs, I & O's  ASSESSMENT:   42 y.o. female with a past medical history of GERD, umbilical hernia, cholelithiasis, nephrolithiasis, cystitis, adnexal mass, left breast mass, and hay fever. She was found to have an adnexal mass that was obstructing her right ureter and underwent a diagnostic laparoscopy with Dr. Dorris Fetch and cystoscopy with right ureteral stent placement with Dr. Louis Meckel on 04/29/2018. She was then discharged home 05/03/2018. She presented to ED 05/06/2018 with complaints of tachycardia and weakness. She was found to have a postprocedural intraabdominal abscess.  9/11: s/p lap colectomy 9/18-9/19: NPO 9/19-9/22: Full liquid diet 9/23: NPO for drain placements. Diet advanced to regular at 1700.  Pt consumed soup and crackers last night for dinner. 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 Adjusted Premier Protein order to BID.   No new weights obtained for this admission.  Labs reviewed. Medications reviewed.  Diet Order:   Diet Order            Diet regular Room service appropriate? Yes; Fluid consistency: Thin  Diet effective now              EDUCATION NEEDS:   Not appropriate for education at this time  Skin:  Skin Assessment: Reviewed RN Assessment  Last BM:  9/22  Height:   Ht Readings from Last 1 Encounters:  05/06/18 5\' 4"  (1.626 m)    Weight:   Wt Readings from Last 1 Encounters:  05/06/18 59 kg    Ideal Body Weight:  54.5 kg  BMI:  Body mass index is 22.31 kg/m.  Estimated Nutritional Needs:   Kcal:   1500-1700  Protein:  70-80g  Fluid:  1.7L/day  Clayton Bibles, MS, RD, LDN [place] Dietitian Pager: [phone] After Hours Pager: [phone]

## 2018-05-13 LAB — CBC
HEMATOCRIT: 22 % — AB (ref 36.0–46.0)
HEMOGLOBIN: 7.3 g/dL — AB (ref 12.0–15.0)
MCH: 29.4 pg (ref 26.0–34.0)
MCHC: 33.2 g/dL (ref 30.0–36.0)
MCV: 88.7 fL (ref 78.0–100.0)
Platelets: 987 10*3/uL (ref 150–400)
RBC: 2.48 MIL/uL — AB (ref 3.87–5.11)
RDW: 15.2 % (ref 11.5–15.5)
WBC: 15.7 10*3/uL — ABNORMAL HIGH (ref 4.0–10.5)

## 2018-05-13 MED ORDER — ASPIRIN EC 81 MG PO TBEC
81.0000 mg | DELAYED_RELEASE_TABLET | Freq: Every day | ORAL | Status: DC
  Administered 2018-05-13 – 2018-05-16 (×4): 81 mg via ORAL
  Filled 2018-05-13 (×4): qty 1

## 2018-05-13 NOTE — Progress Notes (Addendum)
Referring Physician(s): Byerly,F  Supervising Physician: Aletta Edouard  Patient Status:  [facility] - In-pt  Chief Complaint: abdomino-pelvic fluid collections   Subjective: Pt states that she feels a little better; denies N/V; still sore at drain sites as expected; remains sl tachycardic with soft BP  Allergies: Watermelon [citrullus vulgaris] and Amoxicillin  Medications: Prior to Admission medications   Medication Sig Start Date End Date Taking? Authorizing Provider  acetaminophen (TYLENOL) 500 MG tablet Take 500-1,000 mg by mouth daily as needed for moderate pain.   Yes [provider]  Ascorbic Acid (VITAMIN C) 1000 MG tablet Take 1,000 mg by mouth daily. w/Elderberry   Yes [provider]  Cholecalciferol (VITAMIN D-3) 5000 units TABS Take 1,500 Units by mouth daily.    Yes [provider]  IRON PO Take 1 tablet by mouth daily.   Yes [provider]  Misc Natural Products (SUPER GREENS) POWD Take 1 Scoop by mouth daily.   Yes [provider]  Probiotic Product (PROBIOTIC PO) Take 1 capsule by mouth daily.   Yes [provider]  traMADol (ULTRAM) 50 MG tablet Take 1-2 tablets (50-100 mg total) by mouth every 6 (six) hours as needed. 05/03/18  Yes Armandina Gemma, MD  Diindolylmethane POWD Take 1 capsule by mouth daily. (DIM)    [provider]  EVENING PRIMROSE OIL PO Take 1,200 mg by mouth daily.    [provider]  nitrofurantoin, macrocrystal-monohydrate, (MACROBID) 100 MG capsule Take 1 capsule (100 mg total) by mouth 2 (two) times daily. 1 po BId Patient not taking: Reported on 05/06/2018 03/21/18   Dettinger, Fransisca Kaufmann, MD  Omega-3 Fatty Acids (FISH OIL OMEGA-3 PO) Take 2 capsules by mouth daily.     [provider]  RESVERATROL PO Take 1 capsule by mouth daily.     [provider]  SODIUM BICARBONATE PO Take 2 capsules by mouth at bedtime.    [provider]     Vital  Signs: BP 97/65 (BP Location: Right Arm)   Pulse (!) 113   Temp 99.6 F (37.6 C) (Oral)   Resp (!) 22   Ht 5\' 4"  (1.626 m)   Wt 130 lb (59 kg)   LMP 04/29/2018   SpO2 98%   BMI 22.31 kg/m   Physical Exam left abd,RLQ, rt TG drains intact, sites mildly tender, outputs range from 40-130 cc blood-tinged fluid; new cx pend   Imaging: Ct Abdomen Pelvis W Contrast  Result Date: 05/11/2018 CLINICAL DATA:  42 year old female post resection of abdominal mass. Postoperative infections requiring percutaneous drainage. Elevated white count. Assess for residual abscess. Subsequent encounter. EXAM: CT ABDOMEN AND PELVIS WITH CONTRAST TECHNIQUE: Multidetector CT imaging of the abdomen and pelvis was performed using the standard protocol following bolus administration of intravenous contrast. CONTRAST:  139mL OMNIPAQUE IOHEXOL 300 MG/ML  SOLN COMPARISON:  05/07/2018, 05/06/2018 and 02/28/2018 CT. FINDINGS: Lower chest: Basilar subsegmental atelectasis. Very small pleural effusion greater on left. Heart size within normal limits. Hepatobiliary: Possible small focal area of fatty infiltration adjacent to the fissure for the falciform ligament. Elongated liver. Gallstones measuring up to 2 cm. Cannot evaluate for gallbladder inflammation given the surrounding free fluid. Pancreas: No worrisome primary pancreatic mass or inflammation. Spleen: No splenic mass or enlargement. Adrenals/Urinary Tract: Right double-J ureteral stent is in place. Although there is persistent mild prominence of the right renal collecting system, this has improved slightly when compared to prior exam. Subtle wedge-shaped opacity inferior right kidney. Small  area of pyelonephritis not excluded. No worrisome left renal or adrenal lesion. Mild adrenal gland hyperplasia. Noncontrast filled imaging of the urinary bladder reveals right-sided ureteral stent in place. Compression of the dome and posterior aspect by abscess. Stomach/Bowel: Post right  colectomy. Decrease in degree of previously noted small bowel dilation. Portions of small bowel and colon with inflammation, possibly secondarily inflamed from surrounding abscesses. Primary bowel inflammation not excluded. Vascular/Lymphatic: No abdominal aortic aneurysm or large vessel occlusion. Portal veins, splenic veins and superior mesenteric vein are patent. Increase number of normal/top-normal size lymph nodes possibly reactive in origin. Reproductive: Inflammatory process surrounds portions of the uterus and adnexa limiting evaluation. Other: Right transgluteal drain placed in pre rectal/posterior uterine complex collection which has decreased in size now measuring 8.5 x 3.9 x 5.8 cm versus prior 11.9 x 8.2 x 10.2 cm. Post right colectomy. Posterior to surgical clips is a complex fluid collection (possibly containing blood) which measures 6.5 x 8.2 x 6.9 cm versus prior 6.4 x 8 x 6.4 cm. Along the lateral and inferior margin, this communicates with a oblong collection which extends along the right lateral pelvic wall spanning over 11.8 x 3.8 x 3.7 cm versus prior 11.4 x 3.6 x 3.7 cm. Bilobed complex fluid collection (possibly containing blood) along the dome of the bladder is larger on the left measuring 10.5 x 5.9 x 5.8 cm versus prior 10.6 x 5.7 x 5.7 cm. Complex fluid collection along the left lateral lower abdomen/pelvis spans over 6 x 4 x 3 cm versus prior 5.8 x 3.8 x 3 cm. Diffuse hazy infiltration fat planes greater in the pelvis. Loculated free intraperitoneal air. The volume of free intraperitoneal air has decreased slightly compared to prior examination and may represent gas related to recent procedure. Persistent leak secondary less likely consideration although not excluded and attention to this on close follow-up recommended. Mild third spacing of fluid. Postoperative changes periumbilical region where supraumbilical 1.5 cm collection is noted (possibly laparoscopy site). Musculoskeletal:  Degenerative changes lower thoracic and lower lumbar spine. Mild impression upon the anterior aspect of the right psoas muscle by abscess. Superficial extension into the psoas muscle not excluded. IMPRESSION: 1. Right transgluteal drain placed in pre rectal/posterior uterine complex collection which has decreased in size now measuring 8.5 x 3.9 x 5.8 cm versus prior 11.9 x 8.2 x 10.2 cm. 2. Post right colectomy. Posterior to surgical clips is a complex fluid collection (possibly containing blood) which measures 6.5 x 8.2 x 6.9 cm versus prior 6.4 x 8 x 6.4 cm. This possible abscess impresses upon the anterior margin of the psoas muscle and may have superficial extension into such. Immediately adjacent to this complex fluid collection is an oblong slightly complex fluid collection which extends along the right lateral pelvic wall spanning over 11.8 x 3.8 x 3.7 cm versus prior 11.4 x 3.6 x 3.7 cm. 3. Bilobed complex fluid collection (possibly containing blood) along the dome of the bladder is larger on the left measuring 10.5 x 5.9 x 5.8 cm versus prior 10.6 x 5.7 x 5.7 cm. 4. Complex fluid collection along the left lateral lower abdomen/pelvis spans over 6 x 4 x 3 cm versus prior 5.8 x 3.8 x 3 cm. 5. Decrease in degree of small bowel distension. Residual inflammation of small bowel and colon may reflect result of reaction to surrounding abscesses although primary bowel inflammatory process not excluded. 6. Diffuse hazy infiltration fat planes greater in the pelvis. 7. Loculated free intraperitoneal air. The volume  of free intraperitoneal air has decreased slightly compared to prior examination and may represent gas related to recent procedure. Persistent leak secondary less likely consideration although not excluded and attention to this on close follow-up recommended. 8. Mild third spacing of fluid. 9. Postoperative changes periumbilical region where supraumbilical 1.5 cm collection is noted (possibly laparoscopy  site). 10. Gallstones measuring up to 2 cm. Cannot evaluate for gallbladder inflammation given the surrounding free fluid. 11. Right double-J ureteral stent is in place. Although there is persistent mild prominence of the right renal collecting system, this has improved slightly when compared to prior exam. Subtle wedge-shaped opacity inferior right kidney. Small area of pyelonephritis not excluded. Call is into Dr. Barry Dienes. Electronically Signed   By: Genia Del M.D.   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 Barry Dienes, however she returned to the emergency department with fever, tachycardia and weakness a subsequent CT scan performed 05/06/2018 demonstrating an indeterminate fluid collection within the pelvis for which the patient underwent percutaneous drainage catheter placement by Dr. Earleen Newport on 05/07/2018. Unfortunately, today's CT scan obtained for worsening abdominal pain and fever demonstrates interval increase in size of indeterminate fluid collections within the right mid abdomen as well as the left lower abdomen/pelvis. As such, request made for additional percutaneous drainage catheter placement for infection source control purposes. EXAM: CT IMAGE GUIDED DRAINAGE BY PERCUTANEOUS CATHETER x2 COMPARISON:  CT abdomen pelvis-05/11/2018; 05/06/2018; 02/28/2018; CT guided right trans gluteal approach percutaneous drainage catheter placement - 05/07/2018 MEDICATIONS: The patient is currently admitted to the hospital and receiving intravenous antibiotics. The antibiotics were administered within an appropriate time frame prior to the initiation of the procedure. ANESTHESIA/SEDATION: Moderate (conscious) sedation was employed during this procedure. A total of Versed 3 mg and Fentanyl 100 mcg was  administered intravenously. Moderate Sedation Time: 28 minutes. The patient's level of consciousness and vital signs were monitored continuously by radiology nursing throughout the procedure under my direct supervision. CONTRAST:  None COMPLICATIONS: None immediate. PROCEDURE: Informed written consent was obtained from the patient after a discussion of the risks, benefits and alternatives to treatment. The patient was placed supine on the CT gantry and a pre procedural CT was performed re-demonstrating the known abscess/fluid collection within the right mid hemiabdomen and left lower abdomen/pelvis. Dominant collection within the right mid hemiabdomen measured approximately 8.9 x 6.7 cm (image 50, series 2 while additional dominant collection within the left lower abdomen/pelvis measures at least 7.8 x 6.6 cm (image 87, series 2). The procedure was planned. A timeout was performed prior to the initiation of the procedure. The skin overlying the right mid lateral abdomen as well as the ventral aspect of the left lower abdomen/pelvis was prepped and draped in the usual sterile fashion. The overlying soft tissues were anesthetized with 1% lidocaine with epinephrine. Appropriate trajectory was planned with the use of a 22 gauge spinal needle. 18 gauge trocar needles were advanced into both fluid collections and short Amplatz super stiff wires were coiled within both collections. Appropriate positioning was confirmed with a limited CT scan. Both tracks were serially dilated allowing placement of 12 [place] all-purpose drainage catheters. Appropriate positioning was confirmed with a limited postprocedural CT scan. Approximately 100 cc of bloody fluid was aspirated from each of the fluid collections. Both drainage catheters were connected to JP bulbs and  sutured in place. A representative sample was capped and sent to the laboratory for analysis. Dressings were applied. The patient tolerated the above procedures well  without immediate post procedural complication. IMPRESSION: 1. Successful CT guided placement of a 12 French all purpose drain catheter into the dominant collection with the right mid hemiabdomen yielding 100 cc of bloody fluid. 2. Successful CT-guided placement of a 12 French all-purpose drainage catheter into the dominant fluid collection within the left lower abdomen/pelvis yielding 100 cc of bloody fluid. 3. A sample of aspirated fluid was capped and sent to the laboratory for analysis. Electronically Signed   By: Sandi Mariscal M.D.   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 Barry Dienes, however she returned to the emergency department with fever, tachycardia and weakness a subsequent CT scan performed 05/06/2018 demonstrating an indeterminate fluid collection within the pelvis for which the patient underwent percutaneous drainage catheter placement by Dr. Earleen Newport on 05/07/2018. Unfortunately, today's CT scan obtained for worsening abdominal pain and fever demonstrates interval increase in size of indeterminate fluid collections within the right mid abdomen as well as the left lower abdomen/pelvis. As such, request made for additional percutaneous drainage catheter placement for infection source control purposes. EXAM: CT IMAGE GUIDED DRAINAGE BY PERCUTANEOUS CATHETER x2 COMPARISON:  CT abdomen pelvis-05/11/2018; 05/06/2018; 02/28/2018; CT guided right trans gluteal approach percutaneous drainage catheter placement - 05/07/2018 MEDICATIONS: The patient is currently admitted to the hospital and receiving intravenous antibiotics. The antibiotics were administered within an appropriate time frame prior to the initiation of the procedure. ANESTHESIA/SEDATION: Moderate (conscious) sedation was employed during this  procedure. A total of Versed 3 mg and Fentanyl 100 mcg was administered intravenously. Moderate Sedation Time: 28 minutes. The patient's level of consciousness and vital signs were monitored continuously by radiology nursing throughout the procedure under my direct supervision. CONTRAST:  None COMPLICATIONS: None immediate. PROCEDURE: Informed written consent was obtained from the patient after a discussion of the risks, benefits and alternatives to treatment. The patient was placed supine on the CT gantry and a pre procedural CT was performed re-demonstrating the known abscess/fluid collection within the right mid hemiabdomen and left lower abdomen/pelvis. Dominant collection within the right mid hemiabdomen measured approximately 8.9 x 6.7 cm (image 50, series 2 while additional dominant collection within the left lower abdomen/pelvis measures at least 7.8 x 6.6 cm (image 87, series 2). The procedure was planned. A timeout was performed prior to the initiation of the procedure. The skin overlying the right mid lateral abdomen as well as the ventral aspect of the left lower abdomen/pelvis was prepped and draped in the usual sterile fashion. The overlying soft tissues were anesthetized with 1% lidocaine with epinephrine. Appropriate trajectory was planned with the use of a 22 gauge spinal needle. 18 gauge trocar needles were advanced into both fluid collections and short Amplatz super stiff wires were coiled within both collections. Appropriate positioning was confirmed with a limited CT scan. Both tracks were serially dilated allowing placement of 12 [place] all-purpose drainage catheters. Appropriate positioning was confirmed with a limited postprocedural CT scan. Approximately 100 cc of bloody fluid was aspirated from each of the fluid collections. Both drainage catheters were connected to JP bulbs and sutured in place. A representative sample was capped and sent to the laboratory for analysis. Dressings were  applied.  The patient tolerated the above procedures well without immediate post procedural complication. IMPRESSION: 1. Successful CT guided placement of a 12 French all purpose drain catheter into the dominant collection with the right mid hemiabdomen yielding 100 cc of bloody fluid. 2. Successful CT-guided placement of a 12 French all-purpose drainage catheter into the dominant fluid collection within the left lower abdomen/pelvis yielding 100 cc of bloody fluid. 3. A sample of aspirated fluid was capped and sent to the laboratory for analysis. Electronically Signed   By: Sandi Mariscal M.D.   On: 05/11/2018 17:37    Labs:  CBC: Recent Labs    05/09/18 0404 05/10/18 0432 05/11/18 0440 05/12/18 0437  WBC 22.8* 21.8* 18.4* 17.5*  HGB 7.6* 7.1* 7.0* 7.1*  HCT 22.7* 21.7* 21.1* 21.7*  PLT 669* 703* 709* 836*    COAGS: Recent Labs    05/07/18 1338 05/11/18 1434  INR 1.08 1.05    BMP: Recent Labs    05/09/18 0404 05/10/18 0432 05/11/18 0440 05/12/18 0437  NA 139 141 138 136  K 3.8 3.8 4.1 4.3  CL 102 105 103 101  CO2 29 29 28 26   GLUCOSE 106* 101* 103* 109*  BUN 10 9 8  5*  CALCIUM 7.8* 7.9* 7.8* 8.0*  CREATININE 0.46 0.36* 0.32* 0.35*  GFRNONAA >60 >60 >60 >60  GFRAA >60 >60 >60 >60    LIVER FUNCTION TESTS: Recent Labs    02/10/18 1037 05/06/18 2104  BILITOT 0.3 1.0  AST 16 26  ALT 14 18  ALKPHOS 68 76  PROT 7.6 6.7  ALBUMIN 4.4 2.5*    Assessment and Plan: Pt s/pright ureteral stent, laparoscopic partial colectomy- R hemicolectomy, repair of umbilical hernia 6/33/3545; path with inflamm myofibroblastic tumor;postop abdominal/pelvic fluid collections, status post right transgluteal drain placement 9/19;rt mid/lower, left abd drain placements 9/23; afebrile; no new labs today, last hgb yesterday 7.1-?transfuse; new fluid cx pend; cont drain irrigation/lab monitoring; recheck CT within 1 week of most recent drain placement or sooner if clinically  worsens.   Electronically Signed: D. Rowe Robert, PA-C 05/13/2018, 9:53 AM   I spent a total of 15 minutes at the the patient's bedside AND on the patient's hospital floor or unit, greater than 50% of which was counseling/coordinating care for abdomino-pelvic fluid collection drains    Patient ID: Erica Ross, female   DOB: Oct 13, 1975, 42 y.o.   MRN: [id number]

## 2018-05-13 NOTE — Progress Notes (Signed)
CRITICAL VALUE ALERT  Critical Value:  Platelets 987  Date & Time Notied:  05/13/18 1030  Provider Notified: 05/13/2018 1100  Orders Received/Actions taken:new orders noted

## 2018-05-14 LAB — CBC
HEMATOCRIT: 21 % — AB (ref 36.0–46.0)
Hemoglobin: 6.8 g/dL — CL (ref 12.0–15.0)
MCH: 29.1 pg (ref 26.0–34.0)
MCHC: 32.4 g/dL (ref 30.0–36.0)
MCV: 89.7 fL (ref 78.0–100.0)
Platelets: 988 10*3/uL (ref 150–400)
RBC: 2.34 MIL/uL — AB (ref 3.87–5.11)
RDW: 15.4 % (ref 11.5–15.5)
WBC: 12.5 10*3/uL — AB (ref 4.0–10.5)

## 2018-05-14 LAB — PATHOLOGIST SMEAR REVIEW

## 2018-05-14 NOTE — Progress Notes (Signed)
Referring Physician(s): Dr. 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 She underwent a diagnostic laparoscopy, right hemicolectomy, repair of umbilical hernia on 3/32 with Dr. Barry Dienes as well as cystoscopy and right ureteral stent placement at the same time by Dr. Louis Meckel.   Patient was d/ced to home on 9/15 however she presented to ED on 9/18 with complaints of weakness, tachycardia, fever and bloating. CT abdomen/pelvis performed on 9/18 showed multiple loculated fluid collections throughout the abdomen worrisome for postoperative abscesses. IR was consulted for percutaneous drainage of these abscess and a right transgluteal drain was placed by Dr. Earleen Newport on 9/19. Cultures from this aspiration have grown bacteroides fragilis, which is the same growth from blood cultures.  A repeat CT scan was performed on 9/22 which showed residual fluid collections outside of previously placed transgluteal drain. Patient was again brought to IR and two additional drains were placed on 9/23 by Dr. Pascal Lux. Cultures from these drains are pending.   Patient reports she feels great today, her appetite returned last night and she is very hungry. She still has some minimal pain at drain insertion sites but this has steadily decreased since placement. She is hopeful that she can return home soon.   Allergies: Watermelon [citrullus vulgaris] and Amoxicillin  Medications: Prior to Admission medications   Medication Sig Start Date End Date Taking? Authorizing Provider  acetaminophen (TYLENOL) 500 MG tablet Take 500-1,000 mg by mouth daily as  needed for moderate pain.   Yes [provider]  Ascorbic Acid (VITAMIN C) 1000 MG tablet Take 1,000 mg by mouth daily. w/Elderberry   Yes [provider]  Cholecalciferol (VITAMIN D-3) 5000 units TABS Take 1,500 Units by mouth daily.    Yes [provider]  IRON PO Take 1 tablet by mouth daily.   Yes [provider]  Misc Natural Products (SUPER GREENS) POWD Take 1 Scoop by mouth daily.   Yes [provider]  Probiotic Product (PROBIOTIC PO) Take 1 capsule by mouth daily.   Yes [provider]  traMADol (ULTRAM) 50 MG tablet Take 1-2 tablets (50-100 mg total) by mouth every 6 (six) hours as needed. 05/03/18  Yes Armandina Gemma, MD  Diindolylmethane POWD Take 1 capsule by mouth daily. (DIM)    [provider]  EVENING PRIMROSE OIL PO Take 1,200 mg by mouth daily.    [provider]  nitrofurantoin, macrocrystal-monohydrate, (MACROBID) 100 MG capsule Take 1 capsule (100 mg total) by mouth 2 (two) times daily. 1 po BId Patient not taking: Reported on 05/06/2018 03/21/18   Dettinger, Fransisca Kaufmann, MD  Omega-3 Fatty Acids (FISH OIL OMEGA-3 PO) Take 2 capsules by mouth daily.     [provider]  RESVERATROL PO Take 1 capsule by mouth daily.     [provider]  SODIUM BICARBONATE PO Take 2 capsules by mouth at bedtime.    [provider]     Vital Signs: BP 118/68 (BP Location: Right Arm)   Pulse 93   Temp 98.2 F (36.8 C) (Oral)   Resp 17   Ht 5\' 4"  (1.626 m)   Wt 130 lb (59 kg)   LMP  04/29/2018   SpO2 98%   BMI 22.31 kg/m   Physical Exam  Constitutional: She is oriented to person, place, and time. No distress.  Cardiovascular: Normal rate, regular rhythm and normal heart sounds.  Pulmonary/Chest: Effort normal and breath sounds normal.  Abdominal: Soft. She exhibits no distension. There is tenderness (minimal at drain insertion sites).  Transgluteal drain with scant dark red output; insertion  site clean, dry, intact. RLQ abdominal drain with scant dark red output; insertion site clean, dry, intact. LLQ abdominal drain with scant dark red output; insertion site clean, dry, intact. All drains to suction. Patient reports they were just emptied.  Neurological: She is alert and oriented to person, place, and time.  Skin: Skin is warm and dry. She is not diaphoretic.  Psychiatric: She has a normal mood and affect. Her behavior is normal.  Nursing note and vitals reviewed.   Imaging: Ct Abdomen Pelvis W Contrast  Result Date: 05/11/2018 CLINICAL DATA:  42 year old female post resection of abdominal mass. Postoperative infections requiring percutaneous drainage. Elevated white count. Assess for residual abscess. Subsequent encounter. EXAM: CT ABDOMEN AND PELVIS WITH CONTRAST TECHNIQUE: Multidetector CT imaging of the abdomen and pelvis was performed using the standard protocol following bolus administration of intravenous contrast. CONTRAST:  1102mL OMNIPAQUE IOHEXOL 300 MG/ML  SOLN COMPARISON:  05/07/2018, 05/06/2018 and 02/28/2018 CT. FINDINGS: Lower chest: Basilar subsegmental atelectasis. Very small pleural effusion greater on left. Heart size within normal limits. Hepatobiliary: Possible small focal area of fatty infiltration adjacent to the fissure for the falciform ligament. Elongated liver. Gallstones measuring up to 2 cm. Cannot evaluate for gallbladder inflammation given the surrounding free fluid. Pancreas: No worrisome primary pancreatic mass or inflammation. Spleen: No splenic mass or enlargement. Adrenals/Urinary Tract: Right double-J ureteral stent is in place. Although there is persistent mild prominence of the right renal collecting system, this has improved slightly when compared to prior exam. Subtle wedge-shaped opacity inferior right kidney. Small area of pyelonephritis not excluded. No worrisome left renal or adrenal lesion. Mild adrenal gland hyperplasia. Noncontrast filled  imaging of the urinary bladder reveals right-sided ureteral stent in place. Compression of the dome and posterior aspect by abscess. Stomach/Bowel: Post right colectomy. Decrease in degree of previously noted small bowel dilation. Portions of small bowel and colon with inflammation, possibly secondarily inflamed from surrounding abscesses. Primary bowel inflammation not excluded. Vascular/Lymphatic: No abdominal aortic aneurysm or large vessel occlusion. Portal veins, splenic veins and superior mesenteric vein are patent. Increase number of normal/top-normal size lymph nodes possibly reactive in origin. Reproductive: Inflammatory process surrounds portions of the uterus and adnexa limiting evaluation. Other: Right transgluteal drain placed in pre rectal/posterior uterine complex collection which has decreased in size now measuring 8.5 x 3.9 x 5.8 cm versus prior 11.9 x 8.2 x 10.2 cm. Post right colectomy. Posterior to surgical clips is a complex fluid collection (possibly containing blood) which measures 6.5 x 8.2 x 6.9 cm versus prior 6.4 x 8 x 6.4 cm. Along the lateral and inferior margin, this communicates with a oblong collection which extends along the right lateral pelvic wall spanning over 11.8 x 3.8 x 3.7 cm versus prior 11.4 x 3.6 x 3.7 cm. Bilobed complex fluid collection (possibly containing blood) along the dome of the bladder is larger on the left measuring 10.5 x 5.9 x 5.8 cm versus prior 10.6 x 5.7 x 5.7 cm. Complex fluid collection along the left lateral lower abdomen/pelvis spans over 6 x 4 x 3 cm versus prior 5.8 x  3.8 x 3 cm. Diffuse hazy infiltration fat planes greater in the pelvis. Loculated free intraperitoneal air. The volume of free intraperitoneal air has decreased slightly compared to prior examination and may represent gas related to recent procedure. Persistent leak secondary less likely consideration although not excluded and attention to this on close follow-up recommended. Mild third  spacing of fluid. Postoperative changes periumbilical region where supraumbilical 1.5 cm collection is noted (possibly laparoscopy site). Musculoskeletal: Degenerative changes lower thoracic and lower lumbar spine. Mild impression upon the anterior aspect of the right psoas muscle by abscess. Superficial extension into the psoas muscle not excluded. IMPRESSION: 1. Right transgluteal drain placed in pre rectal/posterior uterine complex collection which has decreased in size now measuring 8.5 x 3.9 x 5.8 cm versus prior 11.9 x 8.2 x 10.2 cm. 2. Post right colectomy. Posterior to surgical clips is a complex fluid collection (possibly containing blood) which measures 6.5 x 8.2 x 6.9 cm versus prior 6.4 x 8 x 6.4 cm. This possible abscess impresses upon the anterior margin of the psoas muscle and may have superficial extension into such. Immediately adjacent to this complex fluid collection is an oblong slightly complex fluid collection which extends along the right lateral pelvic wall spanning over 11.8 x 3.8 x 3.7 cm versus prior 11.4 x 3.6 x 3.7 cm. 3. Bilobed complex fluid collection (possibly containing blood) along the dome of the bladder is larger on the left measuring 10.5 x 5.9 x 5.8 cm versus prior 10.6 x 5.7 x 5.7 cm. 4. Complex fluid collection along the left lateral lower abdomen/pelvis spans over 6 x 4 x 3 cm versus prior 5.8 x 3.8 x 3 cm. 5. Decrease in degree of small bowel distension. Residual inflammation of small bowel and colon may reflect result of reaction to surrounding abscesses although primary bowel inflammatory process not excluded. 6. Diffuse hazy infiltration fat planes greater in the pelvis. 7. Loculated free intraperitoneal air. The volume of free intraperitoneal air has decreased slightly compared to prior examination and may represent gas related to recent procedure. Persistent leak secondary less likely consideration although not excluded and attention to this on close follow-up  recommended. 8. Mild third spacing of fluid. 9. Postoperative changes periumbilical region where supraumbilical 1.5 cm collection is noted (possibly laparoscopy site). 10. Gallstones measuring up to 2 cm. Cannot evaluate for gallbladder inflammation given the surrounding free fluid. 11. Right double-J ureteral stent is in place. Although there is persistent mild prominence of the right renal collecting system, this has improved slightly when compared to prior exam. Subtle wedge-shaped opacity inferior right kidney. Small area of pyelonephritis not excluded. Call is into Dr. Barry Dienes. Electronically Signed   By: Genia Del M.D.   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 Barry Dienes, however she returned to the emergency department with fever, tachycardia and weakness a subsequent CT scan performed 05/06/2018 demonstrating an indeterminate fluid collection within the pelvis for which the patient underwent percutaneous drainage catheter placement by Dr. Earleen Newport on 05/07/2018. Unfortunately, today's CT scan obtained for worsening abdominal pain and fever demonstrates interval increase in size of indeterminate fluid collections within the right mid abdomen as well as the left lower abdomen/pelvis. As such, request made for additional percutaneous drainage catheter placement for infection source control purposes. EXAM: CT IMAGE GUIDED DRAINAGE BY PERCUTANEOUS CATHETER  x2 COMPARISON:  CT abdomen pelvis-05/11/2018; 05/06/2018; 02/28/2018; CT guided right trans gluteal approach percutaneous drainage catheter placement - 05/07/2018 MEDICATIONS: The patient is currently admitted to the hospital and receiving intravenous antibiotics. The antibiotics were administered within an appropriate time frame prior to the initiation  of the procedure. ANESTHESIA/SEDATION: Moderate (conscious) sedation was employed during this procedure. A total of Versed 3 mg and Fentanyl 100 mcg was administered intravenously. Moderate Sedation Time: 28 minutes. The patient's level of consciousness and vital signs were monitored continuously by radiology nursing throughout the procedure under my direct supervision. CONTRAST:  None COMPLICATIONS: None immediate. PROCEDURE: Informed written consent was obtained from the patient after a discussion of the risks, benefits and alternatives to treatment. The patient was placed supine on the CT gantry and a pre procedural CT was performed re-demonstrating the known abscess/fluid collection within the right mid hemiabdomen and left lower abdomen/pelvis. Dominant collection within the right mid hemiabdomen measured approximately 8.9 x 6.7 cm (image 50, series 2 while additional dominant collection within the left lower abdomen/pelvis measures at least 7.8 x 6.6 cm (image 87, series 2). The procedure was planned. A timeout was performed prior to the initiation of the procedure. The skin overlying the right mid lateral abdomen as well as the ventral aspect of the left lower abdomen/pelvis was prepped and draped in the usual sterile fashion. The overlying soft tissues were anesthetized with 1% lidocaine with epinephrine. Appropriate trajectory was planned with the use of a 22 gauge spinal needle. 18 gauge trocar needles were advanced into both fluid collections and short Amplatz super stiff wires were coiled within both collections. Appropriate positioning was confirmed with a limited CT scan. Both tracks were serially dilated allowing placement of 12 [place] all-purpose drainage catheters. Appropriate positioning was confirmed with a limited postprocedural CT scan. Approximately 100 cc of bloody fluid was aspirated from each of the fluid collections. Both drainage catheters were connected to JP bulbs and sutured in place. A  representative sample was capped and sent to the laboratory for analysis. Dressings were applied. The patient tolerated the above procedures well without immediate post procedural complication. IMPRESSION: 1. Successful CT guided placement of a 12 French all purpose drain catheter into the dominant collection with the right mid hemiabdomen yielding 100 cc of bloody fluid. 2. Successful CT-guided placement of a 12 French all-purpose drainage catheter into the dominant fluid collection within the left lower abdomen/pelvis yielding 100 cc of bloody fluid. 3. A sample of aspirated fluid was capped and sent to the laboratory for analysis. Electronically Signed   By: Sandi Mariscal M.D.   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 Barry Dienes, however she returned to the emergency department with fever, tachycardia and weakness a subsequent CT scan performed 05/06/2018 demonstrating an indeterminate fluid collection within the pelvis for which the patient underwent percutaneous drainage catheter placement by Dr. Earleen Newport on 05/07/2018. Unfortunately, today's CT scan obtained for worsening abdominal pain and fever demonstrates interval increase in size of indeterminate fluid collections within the right mid abdomen as well as the left lower abdomen/pelvis. As such, request made for additional percutaneous drainage catheter placement for infection source control purposes. EXAM: CT IMAGE GUIDED DRAINAGE BY PERCUTANEOUS CATHETER x2 COMPARISON:  CT abdomen pelvis-05/11/2018; 05/06/2018; 02/28/2018; CT guided right trans gluteal approach percutaneous drainage catheter placement -  05/07/2018 MEDICATIONS: The patient is currently admitted to the hospital and receiving intravenous antibiotics. The antibiotics were administered  within an appropriate time frame prior to the initiation of the procedure. ANESTHESIA/SEDATION: Moderate (conscious) sedation was employed during this procedure. A total of Versed 3 mg and Fentanyl 100 mcg was administered intravenously. Moderate Sedation Time: 28 minutes. The patient's level of consciousness and vital signs were monitored continuously by radiology nursing throughout the procedure under my direct supervision. CONTRAST:  None COMPLICATIONS: None immediate. PROCEDURE: Informed written consent was obtained from the patient after a discussion of the risks, benefits and alternatives to treatment. The patient was placed supine on the CT gantry and a pre procedural CT was performed re-demonstrating the known abscess/fluid collection within the right mid hemiabdomen and left lower abdomen/pelvis. Dominant collection within the right mid hemiabdomen measured approximately 8.9 x 6.7 cm (image 50, series 2 while additional dominant collection within the left lower abdomen/pelvis measures at least 7.8 x 6.6 cm (image 87, series 2). The procedure was planned. A timeout was performed prior to the initiation of the procedure. The skin overlying the right mid lateral abdomen as well as the ventral aspect of the left lower abdomen/pelvis was prepped and draped in the usual sterile fashion. The overlying soft tissues were anesthetized with 1% lidocaine with epinephrine. Appropriate trajectory was planned with the use of a 22 gauge spinal needle. 18 gauge trocar needles were advanced into both fluid collections and short Amplatz super stiff wires were coiled within both collections. Appropriate positioning was confirmed with a limited CT scan. Both tracks were serially dilated allowing placement of 12 [place] all-purpose drainage catheters. Appropriate positioning was confirmed with a limited postprocedural CT scan. Approximately 100 cc of bloody fluid was aspirated from each of the fluid collections. Both drainage  catheters were connected to JP bulbs and sutured in place. A representative sample was capped and sent to the laboratory for analysis. Dressings were applied. The patient tolerated the above procedures well without immediate post procedural complication. IMPRESSION: 1. Successful CT guided placement of a 12 French all purpose drain catheter into the dominant collection with the right mid hemiabdomen yielding 100 cc of bloody fluid. 2. Successful CT-guided placement of a 12 French all-purpose drainage catheter into the dominant fluid collection within the left lower abdomen/pelvis yielding 100 cc of bloody fluid. 3. A sample of aspirated fluid was capped and sent to the laboratory for analysis. Electronically Signed   By: Sandi Mariscal M.D.   On: 05/11/2018 17:37    Labs:  CBC: Recent Labs    05/11/18 0440 05/12/18 0437 05/13/18 1030 05/14/18 0437  WBC 18.4* 17.5* 15.7* 12.5*  HGB 7.0* 7.1* 7.3* 6.8*  HCT 21.1* 21.7* 22.0* 21.0*  PLT 709* 836* 987* 988*    COAGS: Recent Labs    05/07/18 1338 05/11/18 1434  INR 1.08 1.05    BMP: Recent Labs    05/09/18 0404 05/10/18 0432 05/11/18 0440 05/12/18 0437  NA 139 141 138 136  K 3.8 3.8 4.1 4.3  CL 102 105 103 101  CO2 29 29 28 26   GLUCOSE 106* 101* 103* 109*  BUN 10 9 8  5*  CALCIUM 7.8* 7.9* 7.8* 8.0*  CREATININE 0.46 0.36* 0.32* 0.35*  GFRNONAA >60 >60 >60 >60  GFRAA >60 >60 >60 >60    LIVER FUNCTION TESTS: Recent Labs    02/10/18 1037 05/06/18 2104  BILITOT 0.3 1.0  AST 16 26  ALT 14 18  ALKPHOS 68 76  PROT 7.6 6.7  ALBUMIN 4.4 2.5*    Assessment and Plan:  Multiple intra-abdominal abscesses following right hemicolectomy, umbilical hernia repair, cystoscopy and right ureteral stent placement on 04/29/18 with Dr. Barry Dienes and Dr. Louis Meckel. Initial abscess drain was placed 9/19 by Dr. Earleen Newport and output has been steady with 110 cc in last 24 hours, some pain at insertion site but this has improved per patient. RLQ and LLQ  abdominal drains were placed 9/23 by Dr. Pascal Lux, both continue to have dark red output and patient reports pain at insertion sites is improving.  Patient afebrile (Tmax 99.5), tachycardia and BP improving, WBC improved to 12.5,  hgb 6.8, plt 988 this morning (?transfuse) - awaiting orders from hospitalist or surgeon per RN. Culture of initial drain aspirate show bacteroides fragilis for which she is on IV ceftriaxone and metronidazole. Cultures from aspirate of drains placed 9/23 still pending. Transgluteal drain with 27 cc last 24 hours, one abdominal drain with 68 cc - unclear if this is both drains listed together.  IR to follow while inpatient, will schedule follow up in outpatient IR clinic upon discharge. Discharge per admitting service. Repeat CT within 1 week of latest drain placement. Continue TID flushes of all 3 drains with 5 cc of NS, record output for all 3 drains - appears output only recorded for transgluteal drain and one of abdominal drains (listed as right/left abdomen bulb under I/O so it's unclear which is being recorded) - will place orders for all drains to be recorded separately to ensure proper monitoring of output.   Please call IR with questions or concerns.  Electronically Signed: Joaquim Nam, PA-C 05/14/2018, 10:02 AM   I spent a total of 25 Minutes at the the patient's bedside AND on the patient's hospital floor or unit, greater than 50% of which was counseling/coordinating care for intra-abdominal abscess drains x 3.

## 2018-05-14 NOTE — Progress Notes (Addendum)
CRITICAL VALUE ALERT  Critical Value:  Hgb 6.8  Date & Time Notied:  0524  Provider Notified: MD Marcello Moores  Orders Received/Actions taken: Waiting on orders

## 2018-05-14 NOTE — Progress Notes (Signed)
Dr Barry Dienes paged regarding HGB 6.8  Patient is alert and oriented. Will continue to monitor. Donne Hazel, RN

## 2018-05-14 NOTE — Progress Notes (Signed)
MD Marcello Moores was paged again about critical lab value of hgb 6.8. Patient A&Ox4 and vitals are stable. Will continue to monitor.

## 2018-05-15 LAB — CBC
HCT: 22.5 % — ABNORMAL LOW (ref 36.0–46.0)
HEMOGLOBIN: 7.2 g/dL — AB (ref 12.0–15.0)
MCH: 28.6 pg (ref 26.0–34.0)
MCHC: 32 g/dL (ref 30.0–36.0)
MCV: 89.3 fL (ref 78.0–100.0)
Platelets: 1246 10*3/uL (ref 150–400)
RBC: 2.52 MIL/uL — AB (ref 3.87–5.11)
RDW: 15.3 % (ref 11.5–15.5)
WBC: 15.5 10*3/uL — ABNORMAL HIGH (ref 4.0–10.5)

## 2018-05-15 NOTE — Progress Notes (Signed)
Delayed entry. Patient ID: Erica Ross, female   DOB: 05-Sep-1975, 42 y.o.   MRN: [id number] [facility] Surgery Progress Note:   * No surgery found *   Subjective: Pain subsiding.  Hungry today.  No fevers.  WBCs went up just a bit.    Objective: Vital signs in last 24 hours: Temp:  [98.3 F (36.8 C)-98.8 F (37.1 C)] 98.7 F (37.1 C) (09/27 1504) Pulse Rate:  [93-102] 100 (09/27 1504) Resp:  [16-20] 20 (09/27 1504) BP: (104-115)/(60-75) 114/67 (09/27 1504) SpO2:  [99 %-100 %] 100 % (09/27 1504)  Intake/Output from previous day: 09/26 0701 - 09/27 0700 In: 980.8 [P.O.:60; I.V.:375.8; IV Piggyback:500] Out: 2017 [Urine:1950; Drains:67] Intake/Output this shift: Total I/O In: 588.3 [P.O.:240; I.V.:80; IV Piggyback:268.3] Out: 50 [Drains:50]  Physical Exam:  A&O x 3. Looks good. OOB eating.   Resp:  Breathing comfortably GI:  abd soft JPs with old blood.    Lab Results:  Results for orders placed or performed during the hospital encounter of 05/06/18 (from the past 48 hour(s))  CBC     Status: Abnormal   Collection Time: 05/14/18  4:37 AM  Result Value Ref Range   WBC 12.5 (H) 4.0 - 10.5 K/uL   RBC 2.34 (L) 3.87 - 5.11 MIL/uL   Hemoglobin 6.8 (LL) 12.0 - 15.0 g/dL    Comment: REPEATED TO VERIFY CRITICAL RESULT CALLED TO, READ BACK BY AND VERIFIED WITHMarye Round RN [phone] 05/14/18 A NAVARRO    HCT 21.0 (L) 36.0 - 46.0 %   MCV 89.7 78.0 - 100.0 fL   MCH 29.1 26.0 - 34.0 pg   MCHC 32.4 30.0 - 36.0 g/dL   RDW 15.4 11.5 - 15.5 %   Platelets 988 (HH) 150 - 400 K/uL    Comment: CRITICAL VALUE NOTED.  VALUE IS CONSISTENT WITH PREVIOUSLY REPORTED AND CALLED VALUE. Performed at [facility], [place] [address], [place], [place] [address]   CBC     Status: Abnormal   Collection Time: 05/15/18  3:51 AM  Result Value Ref Range   WBC 15.5 (H) 4.0 - 10.5 K/uL   RBC 2.52 (L) 3.87 - 5.11 MIL/uL   Hemoglobin 7.2 (L) 12.0 - 15.0 g/dL   HCT 22.5 (L) 36.0 - 46.0  %   MCV 89.3 78.0 - 100.0 fL   MCH 28.6 26.0 - 34.0 pg   MCHC 32.0 30.0 - 36.0 g/dL   RDW 15.3 11.5 - 15.5 %   Platelets 1,246 (HH) 150 - 400 K/uL    Comment: CRITICAL VALUE NOTED.  VALUE IS CONSISTENT WITH PREVIOUSLY REPORTED AND CALLED VALUE. Performed at [facility], [place] [address] [place], [place] [address]     Radiology/Results: No results found.  Anti-infectives: Anti-infectives (From admission, onward)   Start     Dose/Rate Route Frequency Ordered Stop   05/07/18 0630  metroNIDAZOLE (FLAGYL) IVPB 500 mg     500 mg 100 mL/hr over 60 Minutes Intravenous Every 8 hours 05/07/18 0144     05/07/18 0300  cefTRIAXone (ROCEPHIN) 2 g in sodium chloride 0.9 % 100 mL IVPB     2 g 200 mL/hr over 30 Minutes Intravenous Daily at bedtime 05/07/18 0144     05/06/18 2300  ceFEPIme (MAXIPIME) 2 g in sodium chloride 0.9 % 100 mL IVPB     2 g 200 mL/hr over 30 Minutes Intravenous  Once 05/06/18 2256 05/06/18 2346   05/06/18 2300  metroNIDAZOLE (FLAGYL) IVPB 500  mg     500 mg 100 mL/hr over 60 Minutes Intravenous  Once 05/06/18 2256 05/06/18 2355      Assessment/Plan: Problem List: Patient Active Problem List   Diagnosis Date Noted  . Post-operative infection 05/07/2018  . 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 Adnexal mass 03/17/2018  . Hydronephrosis 03/17/2018   Drains, flush. Continue antibiotics. Diet as tolerated Continues to improve. Repeat CT sometime over weekend.   After repeat CT will transition to oral antibiotics and if remains afebrile and WBCs ok, will plan d/c.     LOS: 8 days    [facility] [phone]  05/15/2018 5:09 PM

## 2018-05-15 NOTE — Progress Notes (Signed)
Delayed entry. Patient ID: Erica Ross, female   DOB: 28-Jun-1976, 42 y.o.   MRN: [id number] [facility] Surgery Progress Note:   * No surgery found *   Subjective: Improving.    Objective: Vital signs in last 24 hours: Temp:  [98.3 F (36.8 C)-98.8 F (37.1 C)] 98.7 F (37.1 C) (09/27 1504) Pulse Rate:  [93-102] 100 (09/27 1504) Resp:  [16-20] 20 (09/27 1504) BP: (104-115)/(60-75) 114/67 (09/27 1504) SpO2:  [99 %-100 %] 100 % (09/27 1504)  Intake/Output from previous day: 09/26 0701 - 09/27 0700 In: 980.8 [P.O.:60; I.V.:375.8; IV Piggyback:500] Out: 2017 [Urine:1950; Drains:67] Intake/Output this shift: Total I/O In: 588.3 [P.O.:240; I.V.:80; IV Piggyback:268.3] Out: 50 [Drains:50]  Physical Exam:  A&O x 3.  NAD.  Washcloth on forehead.  Pale. Resp:  Breathing comfortably GI:  abd soft JP with old blood.    Lab Results:  Results for orders placed or performed during the hospital encounter of 05/06/18 (from the past 48 hour(s))  CBC     Status: Abnormal   Collection Time: 05/14/18  4:37 AM  Result Value Ref Range   WBC 12.5 (H) 4.0 - 10.5 K/uL   RBC 2.34 (L) 3.87 - 5.11 MIL/uL   Hemoglobin 6.8 (LL) 12.0 - 15.0 g/dL    Comment: REPEATED TO VERIFY CRITICAL RESULT CALLED TO, READ BACK BY AND VERIFIED WITHMarye Round RN [phone] 05/14/18 A NAVARRO    HCT 21.0 (L) 36.0 - 46.0 %   MCV 89.7 78.0 - 100.0 fL   MCH 29.1 26.0 - 34.0 pg   MCHC 32.4 30.0 - 36.0 g/dL   RDW 15.4 11.5 - 15.5 %   Platelets 988 (HH) 150 - 400 K/uL    Comment: CRITICAL VALUE NOTED.  VALUE IS CONSISTENT WITH PREVIOUSLY REPORTED AND CALLED VALUE. Performed at [facility], [place] [address], [place], [place] [address]   CBC     Status: Abnormal   Collection Time: 05/15/18  3:51 AM  Result Value Ref Range   WBC 15.5 (H) 4.0 - 10.5 K/uL   RBC 2.52 (L) 3.87 - 5.11 MIL/uL   Hemoglobin 7.2 (L) 12.0 - 15.0 g/dL   HCT 22.5 (L) 36.0 - 46.0 %   MCV 89.3 78.0 - 100.0 fL   MCH 28.6 26.0  - 34.0 pg   MCHC 32.0 30.0 - 36.0 g/dL   RDW 15.3 11.5 - 15.5 %   Platelets 1,246 (HH) 150 - 400 K/uL    Comment: CRITICAL VALUE NOTED.  VALUE IS CONSISTENT WITH PREVIOUSLY REPORTED AND CALLED VALUE. Performed at [facility], [place] [address], [place], [place] [address]     Radiology/Results: No results found.  Anti-infectives: Anti-infectives (From admission, onward)   Start     Dose/Rate Route Frequency Ordered Stop   05/07/18 0630  metroNIDAZOLE (FLAGYL) IVPB 500 mg     500 mg 100 mL/hr over 60 Minutes Intravenous Every 8 hours 05/07/18 0144     05/07/18 0300  cefTRIAXone (ROCEPHIN) 2 g in sodium chloride 0.9 % 100 mL IVPB     2 g 200 mL/hr over 30 Minutes Intravenous Daily at bedtime 05/07/18 0144     05/06/18 2300  ceFEPIme (MAXIPIME) 2 g in sodium chloride 0.9 % 100 mL IVPB     2 g 200 mL/hr over 30 Minutes Intravenous  Once 05/06/18 2256 05/06/18 2346   05/06/18 2300  metroNIDAZOLE (FLAGYL) IVPB 500 mg     500 mg 100 mL/hr over 60 Minutes  Intravenous  Once 05/06/18 2256 05/06/18 2355      Assessment/Plan: Problem List: Patient Active Problem List   Diagnosis Date Noted  . Post-operative infection 05/07/2018  . 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 Adnexal mass 03/17/2018  . Hydronephrosis 03/17/2018   Drains, flush.  Diet as tolerated Continues to improve. REpeat CT sometime over weekend.     LOS: 8 days    [facility] [phone]  05/15/2018 5:08 PM

## 2018-05-15 NOTE — Progress Notes (Signed)
Referring Physician(s): Byerly,F  Supervising Physician: Aletta Edouard  Patient Status:  [facility] - In-pt  Chief Complaint: abdomino-pelvic fluid collections/abscesses   Subjective: Patient sitting up in chair, asking about going home; denies worsening abdominal pain or vomiting; has had some occasional nausea.   Allergies: Watermelon [citrullus vulgaris] and Amoxicillin  Medications: Prior to Admission medications   Medication Sig Start Date End Date Taking? Authorizing Provider  acetaminophen (TYLENOL) 500 MG tablet Take 500-1,000 mg by mouth daily as needed for moderate pain.   Yes [provider]  Ascorbic Acid (VITAMIN C) 1000 MG tablet Take 1,000 mg by mouth daily. w/Elderberry   Yes [provider]  Cholecalciferol (VITAMIN D-3) 5000 units TABS Take 1,500 Units by mouth daily.    Yes [provider]  IRON PO Take 1 tablet by mouth daily.   Yes [provider]  Misc Natural Products (SUPER GREENS) POWD Take 1 Scoop by mouth daily.   Yes [provider]  Probiotic Product (PROBIOTIC PO) Take 1 capsule by mouth daily.   Yes [provider]  traMADol (ULTRAM) 50 MG tablet Take 1-2 tablets (50-100 mg total) by mouth every 6 (six) hours as needed. 05/03/18  Yes Armandina Gemma, MD  Diindolylmethane POWD Take 1 capsule by mouth daily. (DIM)    [provider]  EVENING PRIMROSE OIL PO Take 1,200 mg by mouth daily.    [provider]  nitrofurantoin, macrocrystal-monohydrate, (MACROBID) 100 MG capsule Take 1 capsule (100 mg total) by mouth 2 (two) times daily. 1 po BId Patient not taking: Reported on 05/06/2018 03/21/18   Dettinger, Fransisca Kaufmann, MD  Omega-3 Fatty Acids (FISH OIL OMEGA-3 PO) Take 2 capsules by mouth daily.     [provider]  RESVERATROL PO Take 1 capsule by mouth daily.     [provider]  SODIUM BICARBONATE PO Take 2 capsules by mouth at bedtime.    [provider]      Vital Signs: BP 104/60 (BP Location: Right Arm)   Pulse 93   Temp 98.3 F (36.8 C) (Oral)   Resp 16   Ht 5\' 4"  (1.626 m)   Wt 130 lb (59 kg)   LMP 04/29/2018   SpO2 99%   BMI 22.31 kg/m   Physical Exam: transgluteal and  abdominal drains intact, outputs range from 12 to 24 cc of blood-tinged fluid; sites are clean and dry, mildly tender to palpation.  Imaging: Ct Image Guided Drainage By Percutaneous Catheter  Result Date: 05/11/2018 INDICATION: History of adnexal mass resulting in obstruction of the right ureter for which patient underwent diagnostic laparoscopy on 04/29/2018 by Dr. Barry Dienes, however she returned to the emergency department with fever, tachycardia and weakness a subsequent CT scan performed 05/06/2018 demonstrating an indeterminate fluid collection within the pelvis for which the patient underwent percutaneous drainage catheter placement by Dr. Earleen Newport on 05/07/2018. Unfortunately, today's CT scan obtained for worsening abdominal pain and fever demonstrates interval increase in size of indeterminate fluid collections within the right mid abdomen as well as the left lower abdomen/pelvis. As such, request made for additional percutaneous drainage catheter placement for infection source control purposes. EXAM: CT IMAGE GUIDED DRAINAGE BY PERCUTANEOUS CATHETER x2 COMPARISON:  CT abdomen pelvis-05/11/2018; 05/06/2018; 02/28/2018; CT guided right trans gluteal approach percutaneous drainage catheter placement - 05/07/2018 MEDICATIONS: The patient is currently admitted to the hospital and receiving intravenous antibiotics. The antibiotics were administered within an appropriate time frame prior to the initiation of the procedure. ANESTHESIA/SEDATION:  Moderate (conscious) sedation was employed during this procedure. A total of Versed 3 mg and Fentanyl 100 mcg was administered intravenously. Moderate Sedation Time: 28 minutes. The patient's level of consciousness and vital signs were  monitored continuously by radiology nursing throughout the procedure under my direct supervision. CONTRAST:  None COMPLICATIONS: None immediate. PROCEDURE: Informed written consent was obtained from the patient after a discussion of the risks, benefits and alternatives to treatment. The patient was placed supine on the CT gantry and a pre procedural CT was performed re-demonstrating the known abscess/fluid collection within the right mid hemiabdomen and left lower abdomen/pelvis. Dominant collection within the right mid hemiabdomen measured approximately 8.9 x 6.7 cm (image 50, series 2 while additional dominant collection within the left lower abdomen/pelvis measures at least 7.8 x 6.6 cm (image 87, series 2). The procedure was planned. A timeout was performed prior to the initiation of the procedure. The skin overlying the right mid lateral abdomen as well as the ventral aspect of the left lower abdomen/pelvis was prepped and draped in the usual sterile fashion. The overlying soft tissues were anesthetized with 1% lidocaine with epinephrine. Appropriate trajectory was planned with the use of a 22 gauge spinal needle. 18 gauge trocar needles were advanced into both fluid collections and short Amplatz super stiff wires were coiled within both collections. Appropriate positioning was confirmed with a limited CT scan. Both tracks were serially dilated allowing placement of 12 [place] all-purpose drainage catheters. Appropriate positioning was confirmed with a limited postprocedural CT scan. Approximately 100 cc of bloody fluid was aspirated from each of the fluid collections. Both drainage catheters were connected to JP bulbs and sutured in place. A representative sample was capped and sent to the laboratory for analysis. Dressings were applied. The patient tolerated the above procedures well without immediate post procedural complication. IMPRESSION: 1. Successful CT guided placement of a 12 French all purpose drain  catheter into the dominant collection with the right mid hemiabdomen yielding 100 cc of bloody fluid. 2. Successful CT-guided placement of a 12 French all-purpose drainage catheter into the dominant fluid collection within the left lower abdomen/pelvis yielding 100 cc of bloody fluid. 3. A sample of aspirated fluid was capped and sent to the laboratory for analysis. Electronically Signed   By: Sandi Mariscal M.D.   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 Barry Dienes, however she returned to the emergency department with fever, tachycardia and weakness a subsequent CT scan performed 05/06/2018 demonstrating an indeterminate fluid collection within the pelvis for which the patient underwent percutaneous drainage catheter placement by Dr. Earleen Newport on 05/07/2018. Unfortunately, today's CT scan obtained for worsening abdominal pain and fever demonstrates interval increase in size of indeterminate fluid collections within the right mid abdomen as well as the left lower abdomen/pelvis. As such, request made for additional percutaneous drainage catheter placement for infection source control purposes. EXAM: CT IMAGE GUIDED DRAINAGE BY PERCUTANEOUS CATHETER x2 COMPARISON:  CT abdomen pelvis-05/11/2018; 05/06/2018; 02/28/2018; CT guided right trans gluteal approach percutaneous drainage catheter placement - 05/07/2018 MEDICATIONS: The patient is currently admitted to the hospital and receiving intravenous antibiotics. The antibiotics were administered within an appropriate time frame prior to the initiation of the procedure. ANESTHESIA/SEDATION: Moderate (conscious) sedation was employed during this procedure. A total of Versed 3 mg and Fentanyl 100 mcg was  administered intravenously. Moderate Sedation Time: 28 minutes. The  patient's level of consciousness and vital signs were monitored continuously by radiology nursing throughout the procedure under my direct supervision. CONTRAST:  None COMPLICATIONS: None immediate. PROCEDURE: Informed written consent was obtained from the patient after a discussion of the risks, benefits and alternatives to treatment. The patient was placed supine on the CT gantry and a pre procedural CT was performed re-demonstrating the known abscess/fluid collection within the right mid hemiabdomen and left lower abdomen/pelvis. Dominant collection within the right mid hemiabdomen measured approximately 8.9 x 6.7 cm (image 50, series 2 while additional dominant collection within the left lower abdomen/pelvis measures at least 7.8 x 6.6 cm (image 87, series 2). The procedure was planned. A timeout was performed prior to the initiation of the procedure. The skin overlying the right mid lateral abdomen as well as the ventral aspect of the left lower abdomen/pelvis was prepped and draped in the usual sterile fashion. The overlying soft tissues were anesthetized with 1% lidocaine with epinephrine. Appropriate trajectory was planned with the use of a 22 gauge spinal needle. 18 gauge trocar needles were advanced into both fluid collections and short Amplatz super stiff wires were coiled within both collections. Appropriate positioning was confirmed with a limited CT scan. Both tracks were serially dilated allowing placement of 12 [place] all-purpose drainage catheters. Appropriate positioning was confirmed with a limited postprocedural CT scan. Approximately 100 cc of bloody fluid was aspirated from each of the fluid collections. Both drainage catheters were connected to JP bulbs and sutured in place. A representative sample was capped and sent to the laboratory for analysis. Dressings were applied. The patient tolerated the above procedures well without immediate post procedural complication. IMPRESSION: 1. Successful  CT guided placement of a 12 French all purpose drain catheter into the dominant collection with the right mid hemiabdomen yielding 100 cc of bloody fluid. 2. Successful CT-guided placement of a 12 French all-purpose drainage catheter into the dominant fluid collection within the left lower abdomen/pelvis yielding 100 cc of bloody fluid. 3. A sample of aspirated fluid was capped and sent to the laboratory for analysis. Electronically Signed   By: Sandi Mariscal M.D.   On: 05/11/2018 17:37    Labs:  CBC: Recent Labs    05/12/18 0437 05/13/18 1030 05/14/18 0437 05/15/18 0351  WBC 17.5* 15.7* 12.5* 15.5*  HGB 7.1* 7.3* 6.8* 7.2*  HCT 21.7* 22.0* 21.0* 22.5*  PLT 836* 987* 988* 1,246*    COAGS: Recent Labs    05/07/18 1338 05/11/18 1434  INR 1.08 1.05    BMP: Recent Labs    05/09/18 0404 05/10/18 0432 05/11/18 0440 05/12/18 0437  NA 139 141 138 136  K 3.8 3.8 4.1 4.3  CL 102 105 103 101  CO2 29 29 28 26   GLUCOSE 106* 101* 103* 109*  BUN 10 9 8  5*  CALCIUM 7.8* 7.9* 7.8* 8.0*  CREATININE 0.46 0.36* 0.32* 0.35*  GFRNONAA >60 >60 >60 >60  GFRAA >60 >60 >60 >60    LIVER FUNCTION TESTS: Recent Labs    02/10/18 1037 05/06/18 2104  BILITOT 0.3 1.0  AST 16 26  ALT 14 18  ALKPHOS 68 76  PROT 7.6 6.7  ALBUMIN 4.4 2.5*    Assessment and Plan: Pt s/pright ureteral stent, laparoscopic partial colectomy- R hemicolectomy, repair of umbilical hernia 11/25/1446; path with inflamm myofibroblastic tumor;postop abdominal/pelvic fluid collections, status post right transgluteal drain placement 9/19;rt mid/lower, left abd drain placements 9/23; afebrile; WBC  15.5, up slightly from 12.5, hemoglobin 7.2, up slightly from 6.8, platelets 1246k; fluid cultures growing Bacteroides; continue drain irrigation and close output monitoring, consider transfusion; recheck follow-up CT next week   Electronically Signed: D. Rowe Robert, PA-C 05/15/2018, 12:45 PM   I spent a total of 15  minutes at the the patient's bedside AND on the patient's hospital floor or unit, greater than 50% of which was counseling/coordinating care for abdominal/pelvic abscess drains    Patient ID: Erica Ross, female   DOB: [birth year], 42 y.o.   MRN: [id number]

## 2018-05-16 ENCOUNTER — Inpatient Hospital Stay (HOSPITAL_COMMUNITY): Payer: BLUE CROSS/BLUE SHIELD

## 2018-05-16 LAB — CBC
HEMATOCRIT: 23.4 % — AB (ref 36.0–46.0)
HEMOGLOBIN: 7.4 g/dL — AB (ref 12.0–15.0)
MCH: 28.5 pg (ref 26.0–34.0)
MCHC: 31.6 g/dL (ref 30.0–36.0)
MCV: 90 fL (ref 78.0–100.0)
Platelets: 1303 10*3/uL (ref 150–400)
RBC: 2.6 MIL/uL — ABNORMAL LOW (ref 3.87–5.11)
RDW: 15.6 % — ABNORMAL HIGH (ref 11.5–15.5)
WBC: 14.4 10*3/uL — ABNORMAL HIGH (ref 4.0–10.5)

## 2018-05-16 LAB — AEROBIC/ANAEROBIC CULTURE (SURGICAL/DEEP WOUND)

## 2018-05-16 LAB — AEROBIC/ANAEROBIC CULTURE W GRAM STAIN (SURGICAL/DEEP WOUND): Special Requests: NORMAL

## 2018-05-16 MED ORDER — ASPIRIN EC 81 MG PO TBEC
162.0000 mg | DELAYED_RELEASE_TABLET | Freq: Every day | ORAL | Status: DC
  Administered 2018-05-17 – 2018-05-19 (×3): 162 mg via ORAL
  Filled 2018-05-16 (×3): qty 2

## 2018-05-16 MED ORDER — IOHEXOL 300 MG/ML  SOLN
100.0000 mL | Freq: Once | INTRAMUSCULAR | Status: AC | PRN
  Administered 2018-05-16: 100 mL via INTRAVENOUS

## 2018-05-16 MED ORDER — SODIUM CHLORIDE 0.9 % IJ SOLN
INTRAMUSCULAR | Status: AC
  Filled 2018-05-16: qty 50

## 2018-05-16 NOTE — Progress Notes (Signed)
Patient ID: Erica Ross, female   DOB: 03-18-1976, 42 y.o.   MRN: [id number] [facility] Surgery Progress Note:    Subjective: Continues to improve.    Objective: Vital signs in last 24 hours: Temp:  [98.7 F (37.1 C)-99.4 F (37.4 C)] 98.9 F (37.2 C) (09/28 0523) Pulse Rate:  [96-105] 96 (09/28 0523) Resp:  [16-20] 16 (09/28 0523) BP: (110-117)/(61-67) 110/61 (09/28 0523) SpO2:  [99 %-100 %] 99 % (09/28 0523)  Intake/Output from previous day: 09/27 0701 - 09/28 0700 In: 1664.6 [P.O.:840; I.V.:256.8; IV Piggyback:537.8] Out: 95 [Drains:95] Intake/Output this shift: No intake/output data recorded.  Physical Exam:  A&O x 3. Looks good. OOB eating.   Resp:  Breathing comfortably GI:  abd soft JPs with old blood.    Lab Results:  Results for orders placed or performed during the hospital encounter of 05/06/18 (from the past 48 hour(s))  CBC     Status: Abnormal   Collection Time: 05/15/18  3:51 AM  Result Value Ref Range   WBC 15.5 (H) 4.0 - 10.5 K/uL   RBC 2.52 (L) 3.87 - 5.11 MIL/uL   Hemoglobin 7.2 (L) 12.0 - 15.0 g/dL   HCT 22.5 (L) 36.0 - 46.0 %   MCV 89.3 78.0 - 100.0 fL   MCH 28.6 26.0 - 34.0 pg   MCHC 32.0 30.0 - 36.0 g/dL   RDW 15.3 11.5 - 15.5 %   Platelets 1,246 (HH) 150 - 400 K/uL    Comment: CRITICAL VALUE NOTED.  VALUE IS CONSISTENT WITH PREVIOUSLY REPORTED AND CALLED VALUE. Performed at [facility], [place] [address], [place], [place] [address]   CBC     Status: Abnormal   Collection Time: 05/16/18  3:31 AM  Result Value Ref Range   WBC 14.4 (H) 4.0 - 10.5 K/uL   RBC 2.60 (L) 3.87 - 5.11 MIL/uL   Hemoglobin 7.4 (L) 12.0 - 15.0 g/dL   HCT 23.4 (L) 36.0 - 46.0 %   MCV 90.0 78.0 - 100.0 fL   MCH 28.5 26.0 - 34.0 pg   MCHC 31.6 30.0 - 36.0 g/dL   RDW 15.6 (H) 11.5 - 15.5 %   Platelets 1,303 (HH) 150 - 400 K/uL    Comment: REPEATED TO VERIFY CRITICAL VALUE NOTED.  VALUE IS CONSISTENT WITH PREVIOUSLY REPORTED AND CALLED  VALUE. Performed at [facility], [place] [address], [place], [place] [address]     Radiology/Results: No results found.  Anti-infectives: Anti-infectives (From admission, onward)   Start     Dose/Rate Route Frequency Ordered Stop   05/07/18 0630  metroNIDAZOLE (FLAGYL) IVPB 500 mg     500 mg 100 mL/hr over 60 Minutes Intravenous Every 8 hours 05/07/18 0144     05/07/18 0300  cefTRIAXone (ROCEPHIN) 2 g in sodium chloride 0.9 % 100 mL IVPB     2 g 200 mL/hr over 30 Minutes Intravenous Daily at bedtime 05/07/18 0144     05/06/18 2300  ceFEPIme (MAXIPIME) 2 g in sodium chloride 0.9 % 100 mL IVPB     2 g 200 mL/hr over 30 Minutes Intravenous  Once 05/06/18 2256 05/06/18 2346   05/06/18 2300  metroNIDAZOLE (FLAGYL) IVPB 500 mg     500 mg 100 mL/hr over 60 Minutes Intravenous  Once 05/06/18 2256 05/06/18 2355      Assessment/Plan: Problem List: Patient Active Problem List   Diagnosis Date Noted  . Post-operative infection 05/07/2018  . 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  Adnexal mass 03/17/2018  . Hydronephrosis 03/17/2018   S/p lap right hemicolectomy 9/11 c/b post op bleed, readmit for abscesses.   Path benign - inflammatory myofibroblastic tumor Right hydronephrosis - continue right ureteral stent - discussed with Dr. Louis Meckel- will keep for now while abscesses are being treated.  Drains, flush. Continue antibiotics. Thrombocytosis - aspirin. Diet as tolerated Continues to improve. CT today After repeat CT will transition to oral antibiotics and if remains afebrile and WBCs ok, will plan d/c.     LOS: 9 days    [facility] [phone]  05/16/2018 9:04 AM

## 2018-05-17 LAB — CBC
HCT: 23.3 % — ABNORMAL LOW (ref 36.0–46.0)
Hemoglobin: 7.5 g/dL — ABNORMAL LOW (ref 12.0–15.0)
MCH: 28.8 pg (ref 26.0–34.0)
MCHC: 32.2 g/dL (ref 30.0–36.0)
MCV: 89.6 fL (ref 78.0–100.0)
Platelets: 1312 10*3/uL (ref 150–400)
RBC: 2.6 MIL/uL — ABNORMAL LOW (ref 3.87–5.11)
RDW: 15.8 % — ABNORMAL HIGH (ref 11.5–15.5)
WBC: 13.2 10*3/uL — ABNORMAL HIGH (ref 4.0–10.5)

## 2018-05-17 MED ORDER — CLINDAMYCIN HCL 300 MG PO CAPS: 300.0000 mg | ORAL_CAPSULE | Freq: Four times a day (QID) | ORAL | Status: DC

## 2018-05-17 MED ORDER — METRONIDAZOLE 500 MG PO TABS
500.0000 mg | ORAL_TABLET | Freq: Three times a day (TID) | ORAL | Status: DC
  Administered 2018-05-17 – 2018-05-19 (×6): 500 mg via ORAL
  Filled 2018-05-17 (×6): qty 1

## 2018-05-17 NOTE — Progress Notes (Signed)
Patient ID: Erica Ross, female   DOB: 03/21/1976, 42 y.o.   MRN: [id number] [facility] Surgery Progress Note:    Subjective: Still making positive progression.    Objective: Vital signs in last 24 hours: Temp:  [98 F (36.7 C)-99.1 F (37.3 C)] 99.1 F (37.3 C) (09/29 0523) Pulse Rate:  [96-111] 96 (09/29 0523) Resp:  [16-18] 16 (09/29 0523) BP: (118-127)/(59-74) 118/59 (09/29 0523) SpO2:  [100 %] 100 % (09/29 0523)  Intake/Output from previous day: 09/28 0701 - 09/29 0700 In: 1464.9 [P.O.:720; I.V.:197.7; IV Piggyback:462.2] Out: 30 [Drains:30] Intake/Output this shift: No intake/output data recorded.  Physical Exam:  A&O x 3. Looks good. OOB eating.   Resp:  Breathing comfortably GI:  abd soft JPs with old blood.    Lab Results:  Results for orders placed or performed during the hospital encounter of 05/06/18 (from the past 48 hour(s))  CBC     Status: Abnormal   Collection Time: 05/16/18  3:31 AM  Result Value Ref Range   WBC 14.4 (H) 4.0 - 10.5 K/uL   RBC 2.60 (L) 3.87 - 5.11 MIL/uL   Hemoglobin 7.4 (L) 12.0 - 15.0 g/dL   HCT 23.4 (L) 36.0 - 46.0 %   MCV 90.0 78.0 - 100.0 fL   MCH 28.5 26.0 - 34.0 pg   MCHC 31.6 30.0 - 36.0 g/dL   RDW 15.6 (H) 11.5 - 15.5 %   Platelets 1,303 (HH) 150 - 400 K/uL    Comment: REPEATED TO VERIFY CRITICAL VALUE NOTED.  VALUE IS CONSISTENT WITH PREVIOUSLY REPORTED AND CALLED VALUE. Performed at [facility], [place] [address], [place], [place] [address]   CBC     Status: Abnormal   Collection Time: 05/17/18  3:58 AM  Result Value Ref Range   WBC 13.2 (H) 4.0 - 10.5 K/uL   RBC 2.60 (L) 3.87 - 5.11 MIL/uL   Hemoglobin 7.5 (L) 12.0 - 15.0 g/dL   HCT 23.3 (L) 36.0 - 46.0 %   MCV 89.6 78.0 - 100.0 fL   MCH 28.8 26.0 - 34.0 pg   MCHC 32.2 30.0 - 36.0 g/dL   RDW 15.8 (H) 11.5 - 15.5 %   Platelets 1,312 (HH) 150 - 400 K/uL    Comment: CRITICAL VALUE NOTED.  VALUE IS CONSISTENT WITH PREVIOUSLY REPORTED AND  CALLED VALUE. Performed at [facility], [place] [address], [place], [place] [address]     Radiology/Results: Ct Abdomen Pelvis W Contrast  Result Date: 05/16/2018 CLINICAL DATA:  Status post right hemicolectomy. Status post catheter drainage. EXAM: CT ABDOMEN AND PELVIS WITH CONTRAST TECHNIQUE: Multidetector CT imaging of the abdomen and pelvis was performed using the standard protocol following bolus administration of intravenous contrast. CONTRAST:  144mL OMNIPAQUE IOHEXOL 300 MG/ML  SOLN COMPARISON:  CT scan of May 11, 2018. FINDINGS: Lower chest: No acute abnormality. Hepatobiliary: Multiple large gallstones are noted. No gallbladder inflammation is noted. The liver is unremarkable. No biliary dilatation is noted. Pancreas: Unremarkable. No pancreatic ductal dilatation or surrounding inflammatory changes. Spleen: Normal in size without focal abnormality. Adrenals/Urinary Tract: Adrenal glands appear normal. Left kidney and ureter are unremarkable. Stable position of right ureteral stent is noted. No hydronephrosis or renal obstruction is noted. Urinary bladder is unremarkable. Stomach/Bowel: Stomach appears normal. Status post right hemicolectomy. There is no evidence of bowel obstruction. Vascular/Lymphatic: No significant vascular findings are present. No enlarged abdominal or pelvic lymph nodes. Reproductive: Uterus is grossly unremarkable. No definite adnexal mass is  noted. Other: Stable position of right transgluteal drainage catheter entering pre rectal fluid collection. This fluid collection now measures 8.3 x 5.4 cm in size and is not significantly changed compared to prior exam. Interval placement of new percutaneous drainage catheter into bilobed fluid collection superior to urinary bladder. This currently measures 9.0 x 3.7 cm in size an is significantly smaller compared to prior exam. Interval placement of new percutaneous drainage catheter using right lateral approach  into fluid collection in right retroperitoneal area. This currently measures 4.5 x 3.9 cm and is significantly smaller compared to prior exam. 4.1 x 2.9 cm fluid collection is seen in left lower quadrant of abdomen which is slightly smaller compared to prior exam. 7.8 x 3.6 cm fluid collection is seen in the right lower quadrant which is not significantly changed compared to prior exam. Only a small focus of pneumoperitoneum is noted in the epigastric region which is significantly improved compared to prior exam. Stable small periumbilical fluid collection is noted most consistent with postoperative change. Musculoskeletal: No acute or significant osseous findings. IMPRESSION: Interval placement of new percutaneous drainage catheters into fluid collection superior urinary bladder, as well as into right retroperitoneal fluid collection. These fluid collections are significantly smaller compared to prior exam. Stable position of right trans gluteal drainage catheter into pre rectal fluid collection which is grossly stable in size. Left lower quadrant fluid collection is noted which is slightly smaller compared to prior exam. Stable size of right lower quadrant fluid collection. Significantly decreased pneumoperitoneum is noted compared to prior exam. Status post right hemicolectomy. Cholelithiasis. Electronically Signed   By: Marijo Conception, M.D.   On: 05/16/2018 14:49    Anti-infectives: Anti-infectives (From admission, onward)   Start     Dose/Rate Route Frequency Ordered Stop   05/17/18 1200  clindamycin (CLEOCIN) capsule 300 mg     300 mg Oral Every 6 hours 05/17/18 0947     05/07/18 0630  metroNIDAZOLE (FLAGYL) IVPB 500 mg  Status:  Discontinued     500 mg 100 mL/hr over 60 Minutes Intravenous Every 8 hours 05/07/18 0144 05/17/18 0947   05/07/18 0300  cefTRIAXone (ROCEPHIN) 2 g in sodium chloride 0.9 % 100 mL IVPB  Status:  Discontinued     2 g 200 mL/hr over 30 Minutes Intravenous Daily at bedtime  05/07/18 0144 05/17/18 0947   05/06/18 2300  ceFEPIme (MAXIPIME) 2 g in sodium chloride 0.9 % 100 mL IVPB     2 g 200 mL/hr over 30 Minutes Intravenous  Once 05/06/18 2256 05/06/18 2346   05/06/18 2300  metroNIDAZOLE (FLAGYL) IVPB 500 mg     500 mg 100 mL/hr over 60 Minutes Intravenous  Once 05/06/18 2256 05/06/18 2355      Assessment/Plan: Problem List: Patient Active Problem List   Diagnosis Date Noted  . Post-operative infection 05/07/2018  . 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 Adnexal mass 03/17/2018  . Hydronephrosis 03/17/2018   S/p lap right hemicolectomy 9/11 c/b post op bleed, readmit for abscesses.   Path benign - inflammatory myofibroblastic tumor Right hydronephrosis - continue right ureteral stent - discussed with Dr. Louis Meckel- will keep for now while abscesses are being treated.  Drains, flush.  IR to work on aggressive flushing.   Continue antibiotics. Switch to oral antibiotics.   Thrombocytosis - aspirin. Diet as tolerated Continues to improve. CT with improvement of collections with drains.  Still one collection in RLQ that may be communicating. Discussed with IR>  Likely still pretty  clotted without complete liquefaction.  Will plan repeat CT in around 1-2 weeks.     LOS: 10 days    [facility] [phone]  05/17/2018 9:48 AM

## 2018-05-17 NOTE — Progress Notes (Signed)
Referring Physician(s): Dr. Barry Dienes  Supervising Physician: Marybelle Killings  Patient Status:  [facility] - In-pt  Chief Complaint: Intra-abdominal fluid collections s/p drain placement x3  Subjective: Feeling much better.  Ambulating well. Appetite increasing.   Allergies: Watermelon [citrullus vulgaris] and Amoxicillin  Medications: Prior to Admission medications   Medication Sig Start Date End Date Taking? Authorizing Provider  acetaminophen (TYLENOL) 500 MG tablet Take 500-1,000 mg by mouth daily as needed for moderate pain.   Yes [provider]  Ascorbic Acid (VITAMIN C) 1000 MG tablet Take 1,000 mg by mouth daily. w/Elderberry   Yes [provider]  Cholecalciferol (VITAMIN D-3) 5000 units TABS Take 1,500 Units by mouth daily.    Yes [provider]  IRON PO Take 1 tablet by mouth daily.   Yes [provider]  Misc Natural Products (SUPER GREENS) POWD Take 1 Scoop by mouth daily.   Yes [provider]  Probiotic Product (PROBIOTIC PO) Take 1 capsule by mouth daily.   Yes [provider]  traMADol (ULTRAM) 50 MG tablet Take 1-2 tablets (50-100 mg total) by mouth every 6 (six) hours as needed. 05/03/18  Yes Armandina Gemma, MD  Diindolylmethane POWD Take 1 capsule by mouth daily. (DIM)    [provider]  EVENING PRIMROSE OIL PO Take 1,200 mg by mouth daily.    [provider]  nitrofurantoin, macrocrystal-monohydrate, (MACROBID) 100 MG capsule Take 1 capsule (100 mg total) by mouth 2 (two) times daily. 1 po BId Patient not taking: Reported on 05/06/2018 03/21/18   Dettinger, Fransisca Kaufmann, MD  Omega-3 Fatty Acids (FISH OIL OMEGA-3 PO) Take 2 capsules by mouth daily.     [provider]  RESVERATROL PO Take 1 capsule by mouth daily.     [provider]  SODIUM BICARBONATE PO Take 2 capsules by mouth at bedtime.    [provider]     Vital Signs: BP 118/71 (BP Location: Left Arm)   Pulse (!)  106   Temp 98.8 F (37.1 C) (Oral)   Resp 17   Ht 5\' 4"  (1.626 m)   Wt 130 lb (59 kg)   LMP 04/29/2018   SpO2 99%   BMI 22.31 kg/m   Physical Exam  NAD, alert, sitting up in chair Abdomen: soft, non-tender, non-distended.  Drains remains in place.   Drain in LLQ with insertion site c/d/i.  Thick, bloody output Drain in R TG with insertion site c/d/i.  Thick, bloody output Drain in RLQ with insertion site c/d/i.  Thick, bloody output  Imaging: Ct Abdomen Pelvis W Contrast  Result Date: 05/16/2018 CLINICAL DATA:  Status post right hemicolectomy. Status post catheter drainage. EXAM: CT ABDOMEN AND PELVIS WITH CONTRAST TECHNIQUE: Multidetector CT imaging of the abdomen and pelvis was performed using the standard protocol following bolus administration of intravenous contrast. CONTRAST:  131mL OMNIPAQUE IOHEXOL 300 MG/ML  SOLN COMPARISON:  CT scan of May 11, 2018. FINDINGS: Lower chest: No acute abnormality. Hepatobiliary: Multiple large gallstones are noted. No gallbladder inflammation is noted. The liver is unremarkable. No biliary dilatation is noted. Pancreas: Unremarkable. No pancreatic ductal dilatation or surrounding inflammatory changes. Spleen: Normal in size without focal abnormality. Adrenals/Urinary Tract: Adrenal glands appear normal. Left kidney and ureter are unremarkable. Stable position of right ureteral stent is noted. No hydronephrosis or renal obstruction is noted. Urinary bladder is unremarkable. Stomach/Bowel: Stomach appears normal. Status post right hemicolectomy. There is no evidence of bowel obstruction. Vascular/Lymphatic: No significant vascular findings  are present. No enlarged abdominal or pelvic lymph nodes. Reproductive: Uterus is grossly unremarkable. No definite adnexal mass is noted. Other: Stable position of right transgluteal drainage catheter entering pre rectal fluid collection. This fluid collection now measures 8.3 x 5.4 cm in size and is not  significantly changed compared to prior exam. Interval placement of new percutaneous drainage catheter into bilobed fluid collection superior to urinary bladder. This currently measures 9.0 x 3.7 cm in size an is significantly smaller compared to prior exam. Interval placement of new percutaneous drainage catheter using right lateral approach into fluid collection in right retroperitoneal area. This currently measures 4.5 x 3.9 cm and is significantly smaller compared to prior exam. 4.1 x 2.9 cm fluid collection is seen in left lower quadrant of abdomen which is slightly smaller compared to prior exam. 7.8 x 3.6 cm fluid collection is seen in the right lower quadrant which is not significantly changed compared to prior exam. Only a small focus of pneumoperitoneum is noted in the epigastric region which is significantly improved compared to prior exam. Stable small periumbilical fluid collection is noted most consistent with postoperative change. Musculoskeletal: No acute or significant osseous findings. IMPRESSION: Interval placement of new percutaneous drainage catheters into fluid collection superior urinary bladder, as well as into right retroperitoneal fluid collection. These fluid collections are significantly smaller compared to prior exam. Stable position of right trans gluteal drainage catheter into pre rectal fluid collection which is grossly stable in size. Left lower quadrant fluid collection is noted which is slightly smaller compared to prior exam. Stable size of right lower quadrant fluid collection. Significantly decreased pneumoperitoneum is noted compared to prior exam. Status post right hemicolectomy. Cholelithiasis. Electronically Signed   By: Marijo Conception, M.D.   On: 05/16/2018 14:49    Labs:  CBC: Recent Labs    05/14/18 0437 05/15/18 0351 05/16/18 0331 05/17/18 0358  WBC 12.5* 15.5* 14.4* 13.2*  HGB 6.8* 7.2* 7.4* 7.5*  HCT 21.0* 22.5* 23.4* 23.3*  PLT 988* 1,246* 1,303*  1,312*    COAGS: Recent Labs    05/07/18 1338 05/11/18 1434  INR 1.08 1.05    BMP: Recent Labs    05/09/18 0404 05/10/18 0432 05/11/18 0440 05/12/18 0437  NA 139 141 138 136  K 3.8 3.8 4.1 4.3  CL 102 105 103 101  CO2 29 29 28 26   GLUCOSE 106* 101* 103* 109*  BUN 10 9 8  5*  CALCIUM 7.8* 7.9* 7.8* 8.0*  CREATININE 0.46 0.36* 0.32* 0.35*  GFRNONAA >60 >60 >60 >60  GFRAA >60 >60 >60 >60    LIVER FUNCTION TESTS: Recent Labs    02/10/18 1037 05/06/18 2104  BILITOT 0.3 1.0  AST 16 26  ALT 14 18  ALKPHOS 68 76  PROT 7.6 6.7  ALBUMIN 4.4 2.5*    Assessment and Plan: Intra-abdominal fluid collection s/p drain placement in RLQ, R TG, and LLQ CT Abdomen obtained yesterday shows some improvement, but fluid collections remain.  Patient with significant bloody output which may be clotted, thick.  Aggressively flushed each drain at bedside today.  Was able to aspirate clot and debris. Discussed continuing to aggressively flush with RN.  IR following.   Electronically Signed: Docia Barrier, PA 05/17/2018, 3:28 PM   I spent a total of 15 Minutes at the the patient's bedside AND on the patient's hospital floor or unit, greater than 50% of which was counseling/coordinating care for intra-abdominal fluid collections.

## 2018-05-18 LAB — CBC
HCT: 24.3 % — ABNORMAL LOW (ref 36.0–46.0)
Hemoglobin: 7.6 g/dL — ABNORMAL LOW (ref 12.0–15.0)
MCH: 28.3 pg (ref 26.0–34.0)
MCHC: 31.3 g/dL (ref 30.0–36.0)
MCV: 90.3 fL (ref 78.0–100.0)
PLATELETS: 1311 10*3/uL — AB (ref 150–400)
RBC: 2.69 MIL/uL — ABNORMAL LOW (ref 3.87–5.11)
RDW: 16 % — ABNORMAL HIGH (ref 11.5–15.5)
WBC: 12.9 10*3/uL — AB (ref 4.0–10.5)

## 2018-05-18 NOTE — Progress Notes (Signed)
Referring Physician(s): Byerly,F  Supervising Physician: Arne Cleveland  Patient Status:  [name]  Chief Complaint: abdomino-pelvic fluid collections/abscesses   Subjective: Pt doing ok; no acute changes; states may go home tomorrow   Allergies: Watermelon [citrullus vulgaris] and Amoxicillin  Medications: Prior to Admission medications   Medication Sig Start Date End Date Taking? Authorizing Provider  acetaminophen (TYLENOL) 500 MG tablet Take 500-1,000 mg by mouth daily as needed for moderate pain.   Yes [provider]  Ascorbic Acid (VITAMIN C) 1000 MG tablet Take 1,000 mg by mouth daily. w/Elderberry   Yes [provider]  Cholecalciferol (VITAMIN D-3) 5000 units TABS Take 1,500 Units by mouth daily.    Yes [provider]  IRON PO Take 1 tablet by mouth daily.   Yes [provider]  Misc Natural Products (SUPER GREENS) POWD Take 1 Scoop by mouth daily.   Yes [provider]  Probiotic Product (PROBIOTIC PO) Take 1 capsule by mouth daily.   Yes [provider]  traMADol (ULTRAM) 50 MG tablet Take 1-2 tablets (50-100 mg total) by mouth every 6 (six) hours as needed. 05/03/18  Yes Armandina Gemma, MD  Diindolylmethane POWD Take 1 capsule by mouth daily. (DIM)    [provider]  EVENING PRIMROSE OIL PO Take 1,200 mg by mouth daily.    [provider]  nitrofurantoin, macrocrystal-monohydrate, (MACROBID) 100 MG capsule Take 1 capsule (100 mg total) by mouth 2 (two) times daily. 1 po BId Patient not taking: Reported on 05/06/2018 03/21/18   Dettinger, Fransisca Kaufmann, MD  Omega-3 Fatty Acids (FISH OIL OMEGA-3 PO) Take 2 capsules by mouth daily.     [provider]  RESVERATROL PO Take 1 capsule by mouth daily.     [provider]  SODIUM BICARBONATE PO Take 2 capsules by mouth at bedtime.    [provider]     Vital Signs: BP 116/62 (BP Location: Right Arm)   Pulse 95   Temp 98.5  F (36.9 C) (Oral)   Resp 16   Ht 5\' 4"  (1.626 m)   Wt 130 lb (59 kg)   LMP 04/29/2018   SpO2 100%   BMI 22.31 kg/m   Physical Exam all pelvic drains intact, outputs about 30 cc's each yesterday blood-tinged fluid, 10 cc's today; all drains irrigated without difficulty  Imaging: Ct Abdomen Pelvis W Contrast  Result Date: 05/16/2018 CLINICAL DATA:  Status post right hemicolectomy. Status post catheter drainage. EXAM: CT ABDOMEN AND PELVIS WITH CONTRAST TECHNIQUE: Multidetector CT imaging of the abdomen and pelvis was performed using the standard protocol following bolus administration of intravenous contrast. CONTRAST:  158mL OMNIPAQUE IOHEXOL 300 MG/ML  SOLN COMPARISON:  CT scan of May 11, 2018. FINDINGS: Lower chest: No acute abnormality. Hepatobiliary: Multiple large gallstones are noted. No gallbladder inflammation is noted. The liver is unremarkable. No biliary dilatation is noted. Pancreas: Unremarkable. No pancreatic ductal dilatation or surrounding inflammatory changes. Spleen: Normal in size without focal abnormality. Adrenals/Urinary Tract: Adrenal glands appear normal. Left kidney and ureter are unremarkable. Stable position of right ureteral stent is noted. No hydronephrosis or renal obstruction is noted. Urinary bladder is unremarkable. Stomach/Bowel: Stomach appears normal. Status post right hemicolectomy. There is no evidence of bowel obstruction. Vascular/Lymphatic: No significant vascular findings are present. No enlarged abdominal or pelvic lymph nodes. Reproductive: Uterus is grossly unremarkable. No definite adnexal mass is noted. Other: Stable position of right transgluteal drainage catheter entering pre rectal fluid collection. This  fluid collection now measures 8.3 x 5.4 cm in size and is not significantly changed compared to prior exam. Interval placement of new percutaneous drainage catheter into bilobed fluid collection superior to urinary bladder. This currently  measures 9.0 x 3.7 cm in size an is significantly smaller compared to prior exam. Interval placement of new percutaneous drainage catheter using right lateral approach into fluid collection in right retroperitoneal area. This currently measures 4.5 x 3.9 cm and is significantly smaller compared to prior exam. 4.1 x 2.9 cm fluid collection is seen in left lower quadrant of abdomen which is slightly smaller compared to prior exam. 7.8 x 3.6 cm fluid collection is seen in the right lower quadrant which is not significantly changed compared to prior exam. Only a small focus of pneumoperitoneum is noted in the epigastric region which is significantly improved compared to prior exam. Stable small periumbilical fluid collection is noted most consistent with postoperative change. Musculoskeletal: No acute or significant osseous findings. IMPRESSION: Interval placement of new percutaneous drainage catheters into fluid collection superior urinary bladder, as well as into right retroperitoneal fluid collection. These fluid collections are significantly smaller compared to prior exam. Stable position of right trans gluteal drainage catheter into pre rectal fluid collection which is grossly stable in size. Left lower quadrant fluid collection is noted which is slightly smaller compared to prior exam. Stable size of right lower quadrant fluid collection. Significantly decreased pneumoperitoneum is noted compared to prior exam. Status post right hemicolectomy. Cholelithiasis. Electronically Signed   By: Marijo Conception, M.D.   On: 05/16/2018 14:49    Labs:  CBC: Recent Labs    05/15/18 0351 05/16/18 0331 05/17/18 0358 05/18/18 0902  WBC 15.5* 14.4* 13.2* 12.9*  HGB 7.2* 7.4* 7.5* 7.6*  HCT 22.5* 23.4* 23.3* 24.3*  PLT 1,246* 1,303* 1,312* 1,311*    COAGS: Recent Labs    05/07/18 1338 05/11/18 1434  INR 1.08 1.05    BMP: Recent Labs    05/09/18 0404 05/10/18 0432 05/11/18 0440 05/12/18 0437  NA 139  141 138 136  K 3.8 3.8 4.1 4.3  CL 102 105 103 101  CO2 29 29 28 26   GLUCOSE 106* 101* 103* 109*  BUN 10 9 8  5*  CALCIUM 7.8* 7.9* 7.8* 8.0*  CREATININE 0.46 0.36* 0.32* 0.35*  GFRNONAA >60 >60 >60 >60  GFRAA >60 >60 >60 >60    LIVER FUNCTION TESTS: Recent Labs    02/10/18 1037 05/06/18 2104  BILITOT 0.3 1.0  AST 16 26  ALT 14 18  ALKPHOS 68 76  PROT 7.6 6.7  ALBUMIN 4.4 2.5*    Assessment and Plan: Pt s/pright ureteral stent, laparoscopic partial colectomy- R hemicolectomy, repair of umbilical hernia 4/96/7591; path with inflamm myofibroblastic tumor;postop abdominal/pelvic fluid collections, status post right transgluteal drain placement 9/19;rt mid/lower, left abd drain placements 9/23; cx- bacteroides; afebrile; WBC 12.9(13.2),hgb 7.6(7.5), plts 1311k;cont drain irrigations; schedule for f/u CT in 1-2 weeks at OP clinic; as outpatient recommend once daily flushing of each drain with 5 to 10 cc sterile normal saline, dressing changes every 1 to 2 days and output recording.  Pt given  prescription for saline flushes and also orange output recording cards.   Electronically Signed: D. Rowe Robert, PA-C 05/18/2018, 10:05 AM   I spent a total of 20 minutes at the the patient's bedside AND on the patient's hospital floor or unit, greater than 50% of which was counseling/coordinating care for pelvic abscess drains    Patient ID:  Erica Ross, female   DOB: 22-Aug-1975, 41 y.o.   MRN: [id number]

## 2018-05-18 NOTE — Progress Notes (Signed)
Patient ID: Erica Ross, female   DOB: [birth year], 42 y.o.   MRN: [id number] [facility] Surgery Progress Note:    Subjective: Still making positive progression.    Objective: Vital signs in last 24 hours: Temp:  [98.5 F (36.9 C)-98.6 F (37 C)] 98.6 F (37 C) (09/30 1318) Pulse Rate:  [95-101] 95 (09/30 1318) Resp:  [16-18] 17 (09/30 1318) BP: (112-116)/(62-70) 112/70 (09/30 1318) SpO2:  [100 %] 100 % (09/30 1318)  Intake/Output from previous day: 09/29 0701 - 09/30 0700 In: 1327.5 [P.O.:1200; I.V.:51.8; IV Piggyback:30.6] Out: 848 [Urine:750; Drains:98] Intake/Output this shift: Total I/O In: 600 [P.O.:600] Out: 1422 [Urine:1400; Drains:22]  Physical Exam:  A&O x 3. Looks good. OOB eating.   Resp:  Breathing comfortably GI:  abd soft JPs with old blood.    Lab Results:  Results for orders placed or performed during the hospital encounter of 05/06/18 (from the past 48 hour(s))  CBC     Status: Abnormal   Collection Time: 05/17/18  3:58 AM  Result Value Ref Range   WBC 13.2 (H) 4.0 - 10.5 K/uL   RBC 2.60 (L) 3.87 - 5.11 MIL/uL   Hemoglobin 7.5 (L) 12.0 - 15.0 g/dL   HCT 23.3 (L) 36.0 - 46.0 %   MCV 89.6 78.0 - 100.0 fL   MCH 28.8 26.0 - 34.0 pg   MCHC 32.2 30.0 - 36.0 g/dL   RDW 15.8 (H) 11.5 - 15.5 %   Platelets 1,312 (HH) 150 - 400 K/uL    Comment: CRITICAL VALUE NOTED.  VALUE IS CONSISTENT WITH PREVIOUSLY REPORTED AND CALLED VALUE. Performed at [facility], [place] [address], [place], [place] [address]   CBC     Status: Abnormal   Collection Time: 05/18/18  9:02 AM  Result Value Ref Range   WBC 12.9 (H) 4.0 - 10.5 K/uL   RBC 2.69 (L) 3.87 - 5.11 MIL/uL   Hemoglobin 7.6 (L) 12.0 - 15.0 g/dL   HCT 24.3 (L) 36.0 - 46.0 %   MCV 90.3 78.0 - 100.0 fL   MCH 28.3 26.0 - 34.0 pg   MCHC 31.3 30.0 - 36.0 g/dL   RDW 16.0 (H) 11.5 - 15.5 %   Platelets 1,311 (HH) 150 - 400 K/uL    Comment: CRITICAL VALUE NOTED.  VALUE IS CONSISTENT WITH  PREVIOUSLY REPORTED AND CALLED VALUE. Performed at [facility], [place] [address], [place], [place] [address]     Radiology/Results: No results found.  Anti-infectives: Anti-infectives (From admission, onward)   Start     Dose/Rate Route Frequency Ordered Stop   05/17/18 1400  metroNIDAZOLE (FLAGYL) tablet 500 mg     500 mg Oral Every 8 hours 05/17/18 1117 05/20/18 1359   05/17/18 1200  clindamycin (CLEOCIN) capsule 300 mg  Status:  Discontinued     300 mg Oral Every 6 hours 05/17/18 0947 05/17/18 1116   05/07/18 0630  metroNIDAZOLE (FLAGYL) IVPB 500 mg  Status:  Discontinued     500 mg 100 mL/hr over 60 Minutes Intravenous Every 8 hours 05/07/18 0144 05/17/18 0947   05/07/18 0300  cefTRIAXone (ROCEPHIN) 2 g in sodium chloride 0.9 % 100 mL IVPB  Status:  Discontinued     2 g 200 mL/hr over 30 Minutes Intravenous Daily at bedtime 05/07/18 0144 05/17/18 0947   05/06/18 2300  ceFEPIme (MAXIPIME) 2 g in sodium chloride 0.9 % 100 mL IVPB     2 g 200 mL/hr over 30 Minutes Intravenous  Once 05/06/18 2256 05/06/18 2346   05/06/18 2300  metroNIDAZOLE (FLAGYL) IVPB 500 mg     500 mg 100 mL/hr over 60 Minutes Intravenous  Once 05/06/18 2256 05/06/18 2355      Assessment/Plan: Problem List: Patient Active Problem List   Diagnosis Date Noted  . Post-operative infection 05/07/2018  . 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 Adnexal mass 03/17/2018  . Hydronephrosis 03/17/2018   S/p lap right hemicolectomy 9/11 c/b post op bleed, readmit for abscesses.   Path benign - inflammatory myofibroblastic tumor Right hydronephrosis - continue right ureteral stent - discussed with Dr. Louis Meckel- will keep for now while abscesses are being treated.  Drains, flush.  IR to work on aggressive flushing.   Continue antibiotics.  Oral antibiotics.  Check CBC today.  If WBCs still coming down/not going up and afebrile, plan d/c tomorrow.   Thrombocytosis - aspirin. Diet as  tolerated Continues to improve. CT with improvement of collections with drains.  Still one collection in RLQ that may be communicating. Discussed with IR>  Likely still pretty clotted without complete liquefaction.  Will plan repeat CT in around 1-2 weeks.     LOS: 11 days    [facility] [phone]  05/18/2018 4:47 PM

## 2018-05-19 LAB — CBC
HCT: 26.4 % — ABNORMAL LOW (ref 36.0–46.0)
Hemoglobin: 8.2 g/dL — ABNORMAL LOW (ref 12.0–15.0)
MCH: 28.3 pg (ref 26.0–34.0)
MCHC: 31.1 g/dL (ref 30.0–36.0)
MCV: 91 fL (ref 78.0–100.0)
PLATELETS: 1178 10*3/uL — AB (ref 150–400)
RBC: 2.9 MIL/uL — ABNORMAL LOW (ref 3.87–5.11)
RDW: 16 % — AB (ref 11.5–15.5)
WBC: 11.9 10*3/uL — ABNORMAL HIGH (ref 4.0–10.5)

## 2018-05-19 MED ORDER — ASPIRIN 81 MG PO TBEC: 162.0000 mg | DELAYED_RELEASE_TABLET | Freq: Every day | ORAL | 1 refills | Status: DC

## 2018-05-19 MED ORDER — TRAMADOL HCL 50 MG PO TABS: 50.0000 mg | ORAL_TABLET | Freq: Four times a day (QID) | ORAL | 0 refills | Status: DC | PRN

## 2018-05-19 MED ORDER — METRONIDAZOLE 500 MG PO TABS: 500.0000 mg | ORAL_TABLET | Freq: Three times a day (TID) | ORAL | 1 refills | Status: DC

## 2018-05-19 NOTE — Progress Notes (Signed)
Follow-up orders in place for IR drain clinic appointment to be scheduled after discharge.  Per PA note 9/30, patient has been given prescription for flushes and instructions for drain care.   Brynda Greathouse, MS RD PA-C 9:37 AM

## 2018-05-19 NOTE — Discharge Instructions (Signed)
Percutaneous Abscess Drain, Care After This sheet gives you information about how to care for yourself after your procedure. Your health care provider may also give you more specific instructions. If you have problems or questions, contact your health care provider. What can I expect after the procedure? After your procedure, it is common to have:  A small amount of bruising and discomfort in the area where the drainage tube (catheter) was placed.  Sleepiness and fatigue. This should go away after the medicines you were given have worn off.  Follow these instructions at home: Incision care  Follow instructions from your health care provider about how to take care of your incision. Make sure you: ? Wash your hands with soap and water before you change your bandage (dressing). If soap and water are not available, use hand sanitizer. ? Change your dressing as told by your health care provider. ? Leave stitches (sutures), skin glue, or adhesive strips in place. These skin closures may need to stay in place for 2 weeks or longer. If adhesive strip edges start to loosen and curl up, you may trim the loose edges. Do not remove adhesive strips completely unless your health care provider tells you to do that.  Check your incision area every day for signs of infection. Check for: ? More redness, swelling, or pain. ? More fluid or blood. ? Warmth. ? Pus or a bad smell. ? Fluid leaking from around your catheter (instead of fluid draining through your catheter). Catheter care  Follow instructions from your health care provider about emptying and cleaning your catheter and collection bag. You may need to clean the catheter every day so it does not clog.  Flush drains twice daily with 5 mL saline.  If directed, write down the following information every time you empty your bag: ? The date and time. ? The amount of drainage. General instructions  Rest at home for 1-2 days after your procedure.  Return to your normal activities as told by your health care provider.  Do not take baths, swim, or use a hot tub for 24 hours after your procedure, or until your health care provider says that this is okay.  Take over-the-counter and prescription medicines only as told by your health care provider.  Keep all follow-up visits as told by your health care provider. This is important. Contact a health care provider if:  You have less than 10 mL of drainage a day for 2-3 days in a row, or as directed by your health care provider.  You have more redness, swelling, or pain around your incision area.  You have more fluid or blood coming from your incision area.  Your incision area feels warm to the touch.  You have pus or a bad smell coming from your incision area.  You have fluid leaking from around your catheter (instead of through your catheter).  You have a fever or chills.  You have pain that does not get better with medicine. Get help right away if:  Your catheter comes out.  You suddenly stop having drainage from your catheter.  You suddenly have blood in the fluid that is draining from your catheter.  You become dizzy or you faint.  You develop a rash.  You have nausea or vomiting.  You have difficulty breathing or you feel short of breath.  You develop chest pain.  You have problems with your speech or vision.  You have trouble balancing or moving your arms or legs.  Summary  It is common to have a small amount of bruising and discomfort in the area where the drainage tube (catheter) was placed.  You may be directed to record the amount of drainage from the bag every time you empty it.  Follow instructions from your health care provider about emptying and cleaning your catheter and collection bag. This information is not intended to replace advice given to you by your health care provider. Make sure you discuss any questions you have with your health care  provider. Document Released: 12/20/2013 Document Revised: 06/27/2016 Document Reviewed: 06/27/2016 Elsevier Interactive Patient Education  2017 [employer].

## 2018-05-19 NOTE — Discharge Summary (Signed)
Physician Discharge Summary  Patient ID: Erica Ross MRN: [id number] DOB/AGE: [birth year] 42 y.o.  Admit date: 05/06/2018 Discharge date: 05/19/2018  Admission Diagnoses: Patient Active Problem List   Diagnosis Date Noted  . Post-operative infection 05/07/2018  . 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 Adnexal mass 03/17/2018  . Hydronephrosis 03/17/2018    Discharge Diagnoses:  Active Problems:   Post-operative infection and same as above Acute blood loss anemia INFLAMMATORY MYOFIBROBLASTIC TUMOR  Discharged Condition: stable  Hospital Course:  Pt is a 42 yo F who was readmitted to [facility] with fevers and tachycardia.  She was POD 9 after a lap right colon.  She went home on POD 4 following surgery that was complicated by post op bleeding.  She was stable on discharge, but 5 days later felt very weak with bloating and temps to 101.  CT showed several fluid collections.  She received a perc drain.  She improved some, but her white count was still quite high, she remained distended, and continued to have fevers.  Repeat Ct showed several additional collections and two additional drains were placed. All collections grew b. Fragilis.  Gradually her WBCs came down, her heart rate came back down to normal, and her temperature normalized.  She had an additional CT to evaluate the collections, and they were still present, but improved.  She was transitioned to oral antibiotics.  She remained afebrile and white count continued to go down.  She was discharged to home in stable condition.    Consults: interventional radiology  Significant Diagnostic Studies: labs: WBCs 11.9, HCT 26.4, platelets 1178  Treatments: perc drains.    Discharge Exam: Blood pressure 104/66, pulse 85, temperature 98.2 F (36.8 C), resp. rate 16, height 5\' 4"  (1.626 m), weight 59 kg, last menstrual period 04/29/2018, SpO2 100 %. General appearance: alert, cooperative and no distress Resp: breathing  comfortably GI: soft, non-tender; bowel sounds normal; no masses,  no organomegaly Extremities: extremities normal, atraumatic, no cyanosis or edema  Disposition: Discharge disposition: 01-Home or Self Care       Discharge Instructions    Call MD for:  persistant nausea and vomiting   Complete by:  As directed    Call MD for:  redness, tenderness, or signs of infection (pain, swelling, redness, odor or green/yellow discharge around incision site)   Complete by:  As directed    Call MD for:  severe uncontrolled pain   Complete by:  As directed    Call MD for:  temperature >100.4   Complete by:  As directed    Change dressing (specify)   Complete by:  As directed    Change drain dressings as needed.  Flush drains per IR instructions.   Diet - low sodium heart healthy   Complete by:  As directed    Increase activity slowly   Complete by:  As directed      Allergies as of 05/19/2018      Reactions   Watermelon [citrullus Vulgaris] Itching   Throat itches.   Amoxicillin Rash   Has patient had a PCN reaction causing immediate rash, facial/tongue/throat swelling, SOB or lightheadedness with hypotension: No Has patient had a PCN reaction causing severe rash involving mucus membranes or skin necrosis: No Has patient had a PCN reaction that required hospitalization: No Has patient had a PCN reaction occurring within the last 10 years: Yes--very mild rash If all of the above answers are "NO", then may proceed with Cephalosporin use.  Medication List    TAKE these medications   acetaminophen 500 MG tablet Commonly known as:  TYLENOL Take 500-1,000 mg by mouth daily as needed for moderate pain.   aspirin 81 MG EC tablet Take 2 tablets (162 mg total) by mouth daily.   Diindolylmethane Powd Take 1 capsule by mouth daily. (DIM)   EVENING PRIMROSE OIL PO Take 1,200 mg by mouth daily.   FISH OIL OMEGA-3 PO Take 2 capsules by mouth daily.   IRON PO Take 1 tablet by mouth  daily.   metroNIDAZOLE 500 MG tablet Commonly known as:  FLAGYL Take 1 tablet (500 mg total) by mouth every 8 (eight) hours.   nitrofurantoin (macrocrystal-monohydrate) 100 MG capsule Commonly known as:  MACROBID Take 1 capsule (100 mg total) by mouth 2 (two) times daily. 1 po BId   PROBIOTIC PO Take 1 capsule by mouth daily.   RESVERATROL PO Take 1 capsule by mouth daily.   SODIUM BICARBONATE PO Take 2 capsules by mouth at bedtime.   SUPER GREENS Powd Take 1 Scoop by mouth daily.   traMADol 50 MG tablet Commonly known as:  ULTRAM Take 1-2 tablets (50-100 mg total) by mouth every 6 (six) hours as needed.   vitamin C 1000 MG tablet Take 1,000 mg by mouth daily. w/Elderberry   Vitamin D-3 5000 units Tabs Take 1,500 Units by mouth daily.            Discharge Care Instructions  (From admission, onward)         Start     Ordered   05/19/18 0000  Change dressing (specify)    Comments:  Change drain dressings as needed.  Flush drains per IR instructions.   05/19/18 0911         Follow-up Information    Stark Klein, MD Follow up in 2 week(s).   Specialty:  General Surgery Contact information: [address] [place] [address] [phone]           Signed: Stark Klein 05/19/2018, 9:12 AM

## 2018-05-19 NOTE — Progress Notes (Signed)
Phone call from Dr. Barry Dienes who spoke to Dr. Louis Meckel about pt DC today. Dr Louis Meckel will address stent removal at a later date and pt can be [address] home.

## 2018-05-20 ENCOUNTER — Other Ambulatory Visit: Payer: Self-pay | Admitting: General Surgery

## 2018-05-20 DIAGNOSIS — T8149XA Infection following a procedure, other surgical site, initial encounter: Principal | ICD-10-CM

## 2018-05-20 DIAGNOSIS — T8143XA Infection following a procedure, organ and space surgical site, initial encounter: Secondary | ICD-10-CM

## 2018-05-21 DIAGNOSIS — N130 Hydronephrosis with ureteropelvic junction obstruction: Secondary | ICD-10-CM | POA: Diagnosis not present

## 2018-05-21 MED FILL — NORMAL SALINE FLUSH SYRINGE: 0.9 | 30 days supply | Qty: 300 | Fill #0

## 2018-05-27 ENCOUNTER — Ambulatory Visit
Admission: RE | Admit: 2018-05-27 | Discharge: 2018-05-27 | Disposition: A | Discharge status: Home / Self Care | Payer: BLUE CROSS/BLUE SHIELD | Source: Ambulatory Visit | Attending: Radiology | Admitting: Radiology

## 2018-05-27 ENCOUNTER — Ambulatory Visit
Admission: RE | Admit: 2018-05-27 | Discharge: 2018-05-27 | Disposition: A | Discharge status: Home / Self Care | Payer: BLUE CROSS/BLUE SHIELD | Source: Ambulatory Visit | Attending: General Surgery | Admitting: General Surgery

## 2018-05-27 DIAGNOSIS — K6811 Postprocedural retroperitoneal abscess: Secondary | ICD-10-CM | POA: Diagnosis not present

## 2018-05-27 DIAGNOSIS — T8143XA Infection following a procedure, organ and space surgical site, initial encounter: Secondary | ICD-10-CM

## 2018-05-27 DIAGNOSIS — T8149XA Infection following a procedure, other surgical site, initial encounter: Principal | ICD-10-CM

## 2018-05-27 DIAGNOSIS — Z4689 Encounter for fitting and adjustment of other specified devices: Secondary | ICD-10-CM | POA: Diagnosis not present

## 2018-05-27 HISTORY — PX: IR RADIOLOGIST EVAL & MGMT: IMG5224

## 2018-05-27 MED ORDER — IOPAMIDOL (ISOVUE-300) INJECTION 61%
100.0000 mL | Freq: Once | INTRAVENOUS | Status: AC | PRN
  Administered 2018-05-27: 100 mL via INTRAVENOUS

## 2018-05-27 NOTE — Progress Notes (Signed)
Referring Physician(s): Allred,Darrell K  Chief Complaint: The patient is seen in follow up today s/p intra-abdominal fluid collections requiring drain placement x1 05/07/18 by Dr. Earleen Newport, and x2 05/11/18 by Dr. Pascal Lux.  History of present illness: Erica Ross is a 42 y.o. female with a past medical history of cholelithiasis, nephrolithiasis, cystitis, and adnexal mass that was obstructing the right ureter.  She underwent a R colectomy and ureteral stent placement in joint operation with Dr. Louis Meckel and Dr. Barry Dienes 04/29/18.  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 She was discharged home, but returned to the ED with abdominal pain 05/06/18.  A CT Abdomen/Pelvis showed intraabdominal fluid collections.  She underwent R transgluteal drain placement 05/07/18 by Dr. Earleen Newport, but required 2 additional drains-- RLQ and LLQ/pelvis-- 05/11/18 with Dr. Pascal Lux. Culture from drains was positive for bacteroides.  She was maintained on antibiotics and remained in the hospital for several days after drain placement for ongoing care and management.  Ultimately, she was discharged home on antibiotics with all 3 drains remaining in place.  She presents to IR drain clinic today for follow-up of her drains.  She states she has continued to improve at home.  All drains have significantly decreased in volume of output. She denies fever, chills, abdominal pain, nausea, vomiting, difficulty eating/drinking.  She is still taking Flagyl.  Past Medical History:  Diagnosis Date  . Adnexal mass 03/02/2018   Right 5.1cm  . Gallstones   . GERD (gastroesophageal reflux disease)    occ   . Hay fever   . Hernia, umbilical   . Kidney stones   . Left breast mass   . Renal calculus, right   . UTI (urinary tract infection)     Past Surgical History:  Procedure Laterality Date  . CYSTOSCOPY WITH STENT PLACEMENT Bilateral 04/29/2018   Procedure: CYSTOSCOPY WITH  BILATERAL RETROGRADE PYELOGRAM RIGHT  STENT PLACEMENT;  Surgeon: Ardis Hughs, MD;  Location: [facility];  Service: Urology;  Laterality: Bilateral;  . IR RADIOLOGIST EVAL & MGMT  05/27/2018  . LAPAROSCOPIC REMOVAL ABDOMINAL MASS N/A 04/29/2018   Procedure: LAPAROSCOPIC RIGHT HEMI-COLECTOMY ERAS PATHWAY;  Surgeon: Stark Klein, MD;  Location: [facility];  Service: General;  Laterality: N/A;    Allergies: Watermelon [citrullus vulgaris] and Amoxicillin  Medications: Prior to Admission medications   Medication Sig Start Date End Date Taking? Authorizing Provider  acetaminophen (TYLENOL) 500 MG tablet Take 500-1,000 mg by mouth daily as needed for moderate pain.    [provider]  Ascorbic Acid (VITAMIN C) 1000 MG tablet Take 1,000 mg by mouth daily. w/Elderberry    [provider]  aspirin EC 81 MG EC tablet Take 2 tablets (162 mg total) by mouth daily. 05/19/18   Stark Klein, MD  Cholecalciferol (VITAMIN D-3) 5000 units TABS Take 1,500 Units by mouth daily.     [provider]  Diindolylmethane POWD Take 1 capsule by mouth daily. (DIM)    [provider]  EVENING PRIMROSE OIL PO Take 1,200 mg by mouth daily.    [provider]  IRON PO Take 1 tablet by mouth daily.    [provider]  metroNIDAZOLE (FLAGYL) 500 MG tablet Take 1 tablet (500 mg total) by mouth every 8 (eight) hours. 05/19/18   Stark Klein, MD  Misc Natural Products (SUPER GREENS) POWD Take 1 Scoop by mouth daily.    [provider]  nitrofurantoin, macrocrystal-monohydrate, (MACROBID) 100 MG capsule Take 1 capsule (100 mg total)  by mouth 2 (two) times daily. 1 po BId Patient not taking: Reported on 05/06/2018 03/21/18   Dettinger, Fransisca Kaufmann, MD  Omega-3 Fatty Acids (FISH OIL OMEGA-3 PO) Take 2 capsules by mouth daily.     [provider]  Probiotic Product (PROBIOTIC PO) Take 1 capsule by mouth daily.    [provider]  RESVERATROL PO Take 1 capsule by mouth daily.     [provider]  SODIUM BICARBONATE PO Take 2 capsules by mouth at bedtime.    [provider]  traMADol (ULTRAM) 50 MG tablet Take 1-2 tablets (50-100 mg total) by mouth every 6 (six) hours as needed. 05/19/18   Stark Klein, MD     Family History  Problem Relation Age of Onset  . Diabetes Mother   . Aneurysm Father   . Diabetes Brother   . Breast cancer Paternal Grandmother   . Breast cancer Other     Social History   Socioeconomic History  . Marital status: Married    Spouse name: Not on file  . Number of children: Not on file  . Years of education: Not on file  . Highest education level: Not on file  Occupational History  . Not on file  Social Needs  . Financial resource strain: Not on file  . Food insecurity:    Worry: Not on file    Inability: Not on file  . Transportation needs:    Medical: Not on file    Non-medical: Not on file  Tobacco Use  . Smoking status: Never Smoker  . Smokeless tobacco: Never Used  Substance and Sexual Activity  . Alcohol use: No    Alcohol/week: 0.0 standard drinks  . Drug use: No  . Sexual activity: Yes  Lifestyle  . Physical activity:    Days per week: Not on file    Minutes per session: Not on file  . Stress: Not on file  Relationships  . Social connections:    Talks on phone: Not on file    Gets together: Not on file    Attends religious service: Not on file    Active member of club or organization: Not on file    Attends meetings of clubs or organizations: Not on file    Relationship status: Not on file  Other Topics Concern  . Not on file  Social History Narrative  . Not on file     Vital Signs: LMP 04/29/2018   Physical Exam  NAD, alert Abdomen:  Soft, non-distended.  RLQ drain intact.  Insertion site clean and dry. Small amount of serosanguinous output in collection bulb.  LLQ drain intact.  Insertion site clean and dry.  Small amount of mostly serous-appearing fluid in bulb R TG drain intact.  Insertion site clean and dry.  Small amount of bloody output in collection bulb.  Imaging: Ct Abdomen Pelvis W Contrast  Result Date: 05/27/2018 CLINICAL DATA:  42 year old female with a history of right adnexal myofibroblastic tumor status post excision with right colectomy and right ureteral stent placement. Postoperative course was complicated by the formation of multiple intra-abdominal hematomas with superinfection. These were treated by placement of 3 percutaneous drains (right trans gluteal, right mid abdomen, left lower quadrant) on 05/11/2018. Initial follow-up CT imaging on 05/16/2018 demonstrated persistent but improving fluid collections. Patient presents today clinically improved for repeat CT imaging to evaluate residual abscesses. EXAM: CT ABDOMEN AND PELVIS WITH CONTRAST TECHNIQUE: Multidetector CT imaging of the abdomen and pelvis was  performed using the standard protocol following bolus administration of intravenous contrast. CONTRAST:  152m ISOVUE-300 IOPAMIDOL (ISOVUE-300) INJECTION 61% COMPARISON:  Prior CT scan of the abdomen and pelvis 05/16/2018, 05/11/2018, 05/06/2018 FINDINGS: Lower chest: The lung bases are clear. Visualized cardiac structures are within normal limits for size. No pericardial effusion. Unremarkable visualized distal thoracic esophagus. Hepatobiliary: Cholelithiasis without evidence of intra or extrahepatic biliary ductal dilatation. Normal hepatic contour morphology. No discrete hepatic lesion. Pancreas: Unremarkable. No pancreatic ductal dilatation or surrounding inflammatory changes. Spleen: Normal in size without focal abnormality. Adrenals/Urinary Tract: Normal adrenal glands. The left kidney is normal in appearance. Persistent mild to moderate right-sided hydronephrosis despite the presence of a well-positioned right double-J ureteral stent. Stomach/Bowel: Surgical changes of prior right hemicolectomy. No evidence of focal bowel wall thickening or obstruction.  Vascular/Lymphatic: No significant vascular findings are present. No enlarged abdominal or pelvic lymph nodes. Reproductive: The uterus and left adnexa are unremarkable. Other: 3 percutaneous drainage catheters remain in position; 1 in the right hemiabdomen adjacent to the right hemicolectomy site and right ureteral stent, 1 in the pelvic cul-de-sac via a right-sided trans gluteal approach, and 1 in the left lower quadrant. The previously noted abscess collections have essentially completely resolved. There may be a trace pocket of fluid in the right lower quadrant which measures no more than 1.7 x 0.5 cm. No residual undrained fluid collections are present. No evidence of hernia. Musculoskeletal: No acute fracture or aggressive appearing lytic or blastic osseous lesion. IMPRESSION: Significant improvement with near complete resolution of all previously noted intra-abdominal fluid collections. Persistent mild to moderate right-sided hydronephrosis despite the presence of a well-positioned double-J ureteral stent. This may represent poor functioning of the ureteral stent, or residual capacious notice of the collecting system secondary to longstanding hydronephrosis. Cholelithiasis. Electronically Signed   By: HJacqulynn CadetM.D.   On: 05/27/2018 14:49   Dg Sinus/fist Tube Chk-non Gi  Result Date: 05/27/2018 INDICATION: 42year old female with a history of postoperative abscesses/hematomas following right hemicolectomy and right adnexal resection for right adnexal mass. She has 3 percutaneous drainage catheters, all of which are putting out less than 10 mL daily of serous fluid. There is no purulence or foul smelling output. Furthermore, her CT scan performed earlier today demonstrates near-total interval resolution of all of the previously identified fluid collections. She presents for contrast injection in the right mid abdominal drainage catheter as it is in relatively close proximity with her right ureteral  stent and right colonic resection margin. EXAM: Drain injection MEDICATIONS: None. ANESTHESIA/SEDATION: None. COMPLICATIONS: None immediate. PROCEDURE: Informed written consent was obtained from the patient after a thorough discussion of the procedural risks, benefits and alternatives. All questions were addressed. A timeout was performed prior to the initiation of the procedure. Initial spot imaging demonstrates 3 percutaneous drainage catheters in the expected locations. The right mid abdominal drainage catheter was then carefully hand injected under fluoroscopy. The drainage catheter was partially clogged and very difficult to inject. Upon injection, contrast material encases the right transverse colon. There is a small amount of contrast material which tracks into the collapsed abscess cavity along the right pericolic gutter. Contrast also extravasates along the tube site to the skin surface. There is no definite fistulous communication. IMPRESSION: Collapsed abscess cavity.  No convincing fistulous communication. Electronically Signed   By: HJacqulynn CadetM.D.   On: 05/27/2018 15:09   Ir Radiologist Eval & Mgmt  Result Date: 05/27/2018 Please refer to notes tab for details about interventional procedure. (Op  Note)   Labs:  CBC: Recent Labs    05/16/18 0331 05/17/18 0358 05/18/18 0902 05/19/18 0455  WBC 14.4* 13.2* 12.9* 11.9*  HGB 7.4* 7.5* 7.6* 8.2*  HCT 23.4* 23.3* 24.3* 26.4*  PLT 1,303* 1,312* 1,311* 1,178*    COAGS: Recent Labs    05/07/18 1338 05/11/18 1434  INR 1.08 1.05    BMP: Recent Labs    05/09/18 0404 05/10/18 0432 05/11/18 0440 05/12/18 0437  NA 139 141 138 136  K 3.8 3.8 4.1 4.3  CL 102 105 103 101  CO2 _0 GLUCOSE 106* 101* 103* 109*  BUN _1 5*  CALCIUM 7.8* 7.9* 7.8* 8.0*  CREATININE 0.46 0.36* 0.32* 0.35*  GFRNONAA >60 >60 >60 >60  GFRAA >60 >60 >60 >60    LIVER FUNCTION TESTS: Recent Labs    02/10/18 1037 05/06/18 2104    BILITOT 0.3 1.0  AST 16 26  ALT 14 18  ALKPHOS 68 76  PROT 7.6 6.7  ALBUMIN 4.4 2.5*    Assessment: Intra-abdominal fluid collections s/p drain placement x3: R TG drain placed 05/07/18 by Dr. Earleen Newport RLQ and LLQ drains placed 05/11/18 by Dr. Pascal Lux Patient states she has been doing well at home.  She continues on oral antibiotics for 2-3 more days.   CT Abdomen/Pelvis shows significant improvement in all fluid collections with apparent resolve.  Drain injection performed of the RLQ drain and shows no definite fistula today.  Dr. Laurence Ferrari has met with patient to discuss imaging, findings, and drain removal.   All 3 drains are removed today in their entirety without complication.  Dressing applied and care instructions given.  No further needs in IR at this time.   Signed: Docia Barrier, PA 05/27/2018, 3:45 PM   Please refer to Dr. Laurence Ferrari attestation of this note for management and plan.

## 2018-06-04 DIAGNOSIS — R8271 Bacteriuria: Secondary | ICD-10-CM | POA: Diagnosis not present

## 2018-06-04 DIAGNOSIS — N130 Hydronephrosis with ureteropelvic junction obstruction: Secondary | ICD-10-CM | POA: Diagnosis not present

## 2018-06-23 ENCOUNTER — Other Ambulatory Visit: Payer: BLUE CROSS/BLUE SHIELD

## 2018-06-23 DIAGNOSIS — D62 Acute posthemorrhagic anemia: Secondary | ICD-10-CM | POA: Diagnosis not present

## 2018-07-20 DIAGNOSIS — N1339 Other hydronephrosis: Secondary | ICD-10-CM | POA: Diagnosis not present

## 2018-07-20 DIAGNOSIS — N200 Calculus of kidney: Secondary | ICD-10-CM | POA: Diagnosis not present

## 2018-09-04 ENCOUNTER — Ambulatory Visit (INDEPENDENT_AMBULATORY_CARE_PROVIDER_SITE_OTHER): Payer: BLUE CROSS/BLUE SHIELD | Admitting: Nurse Practitioner

## 2018-09-04 ENCOUNTER — Encounter: Payer: Self-pay | Admitting: Nurse Practitioner

## 2018-09-04 VITALS — BP 119/78 | HR 90 | Temp 98.0°F | Ht 64.0 in | Wt 138.0 lb

## 2018-09-04 DIAGNOSIS — J029 Acute pharyngitis, unspecified: Secondary | ICD-10-CM

## 2018-09-04 MED ORDER — AZITHROMYCIN 250 MG PO TABS: ORAL_TABLET | ORAL | 0 refills | Status: DC

## 2018-09-04 NOTE — Progress Notes (Signed)
   Subjective:    Patient ID: Erica Ross, female    DOB: [birth year], 43 y.o.   MRN: [id number]   Chief Complaint: Sore Throat   HPI Patient comes in today c/o sore throat, cough and congestion. Sh ehad surgery 2 weeks ago and has not felt very well since.    Review of Systems  Constitutional: Negative for chills and fever.  HENT: Positive for congestion, rhinorrhea, sinus pain and sore throat. Negative for trouble swallowing.   Respiratory: Positive for cough.   Cardiovascular: Negative.   Genitourinary: Negative.   Musculoskeletal: Negative.   Neurological: Negative.   Psychiatric/Behavioral: Negative.   All other systems reviewed and are negative.      Objective:   Physical Exam Constitutional:      Appearance: She is well-developed and normal weight.  HENT:     Right Ear: Hearing, tympanic membrane, ear canal and external ear normal.     Left Ear: Hearing, tympanic membrane, ear canal and external ear normal.     Nose: Mucosal edema, congestion and rhinorrhea present.     Right Turbinates: Pale.     Left Turbinates: Pale.     Right Sinus: No maxillary sinus tenderness.     Left Sinus: No maxillary sinus tenderness.     Mouth/Throat:     Mouth: Mucous membranes are moist.     Pharynx: Oropharynx is clear. Posterior oropharyngeal erythema (mild) present.  Neck:     Musculoskeletal: Normal range of motion and neck supple.  Cardiovascular:     Rate and Rhythm: Normal rate and regular rhythm.  Pulmonary:     Effort: Pulmonary effort is normal.     Breath sounds: Normal breath sounds.  Abdominal:     General: Bowel sounds are normal.     Palpations: Abdomen is soft.  Skin:    General: Skin is warm and dry.  Neurological:     General: No focal deficit present.     Mental Status: She is alert.  Psychiatric:        Mood and Affect: Mood normal.        Behavior: Behavior normal.   BP 119/78   Pulse 90   Temp 98 F (36.7 C) (Oral)   Ht 5\' 4"  (1.626 m)   Wt  138 lb (62.6 kg)   BMI 23.69 kg/m       Assessment & Plan:  Erica Ross in today with chief complaint of Sore Throat   1. Acute pharyngitis, unspecified etiology 1. Take meds as prescribed 2. Use a cool mist humidifier especially during the winter months and when heat has been humid. 3. Use saline nose sprays frequently 4. Saline irrigations of the nose can be very helpful if done frequently.  * 4X daily for 1 week*  * Use of a nettie pot can be helpful with this. Follow directions with this* 5. Drink plenty of fluids 6. Keep thermostat turn down low 7.For any cough or congestion  Use plain Mucinex- regular strength or max strength is fine   * Children- consult with Pharmacist for dosing 8. For fever or aces or pains- take tylenol or ibuprofen appropriate for age and weight.  * for fevers greater than 101 orally you may alternate ibuprofen and tylenol every  3 hours.    - azithromycin (ZITHROMAX Z-PAK) 250 MG tablet; As directed  Dispense: 6 tablet; Refill: 0  Mary-Margaret Hassell Done, FNP

## 2018-09-04 NOTE — Patient Instructions (Signed)

## 2019-03-07 IMAGING — CT CT ABD-PELV W/ CM
2 of 4 series · 11 of 46 positions shown, 12 images · IV contrast (iopamidol)
Comparison: Prior CT scan of the abdomen and pelvis 05/16/2018,
05/11/2018, 05/06/2018

CLINICAL DATA: 42-year-old female with a history of right adnexal
myofibroblastic tumor status post excision with right colectomy and
right ureteral stent placement. Postoperative course was complicated
by the formation of multiple intra-abdominal hematomas with
superinfection. These were treated by placement of 3 percutaneous
drains (right trans gluteal, right mid abdomen, left lower quadrant)
on 05/11/2018. Initial follow-up CT imaging on 05/16/2018
demonstrated persistent but improving fluid collections. Patient
presents today clinically improved for repeat CT imaging to evaluate
residual abscesses.

EXAM:
CT ABDOMEN AND PELVIS WITH CONTRAST
TECHNIQUE: Multidetector CT imaging of the abdomen and pelvis was performed
using the standard protocol following bolus administration of
intravenous contrast.
CONTRAST:  100mL TODCQG-EMM IOPAMIDOL (TODCQG-EMM) INJECTION 61%

[Series 2: abd pelvis 5.00 br40 s3 ax · axial · 0.56mm/px · z∈[+1223,+1603]mm · 8 of 92 slices shown, 9 images]
[im 8/92  soft-tissue]
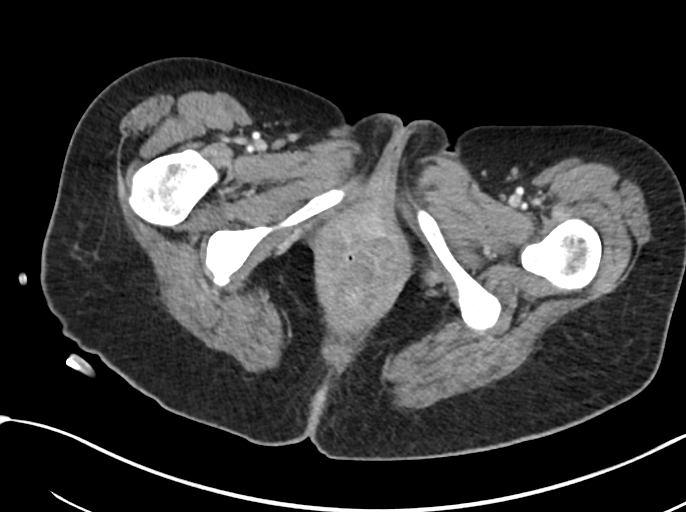
[im 8/92  bone]
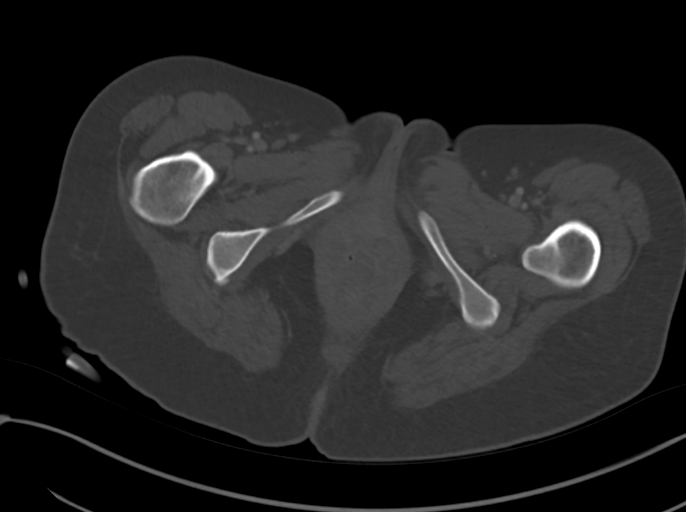
[im 19/92  soft-tissue]
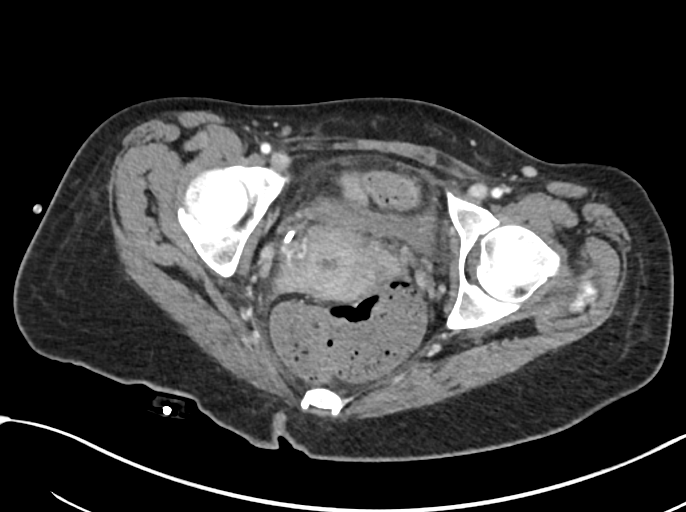
[im 30/92  soft-tissue]
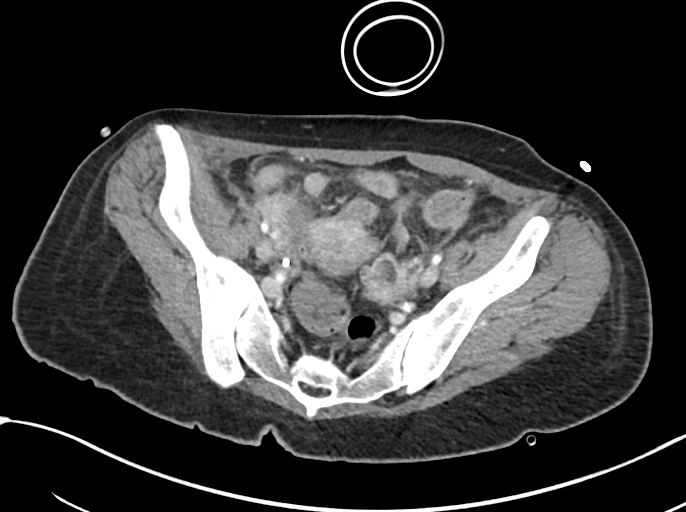
[im 41/92  soft-tissue]
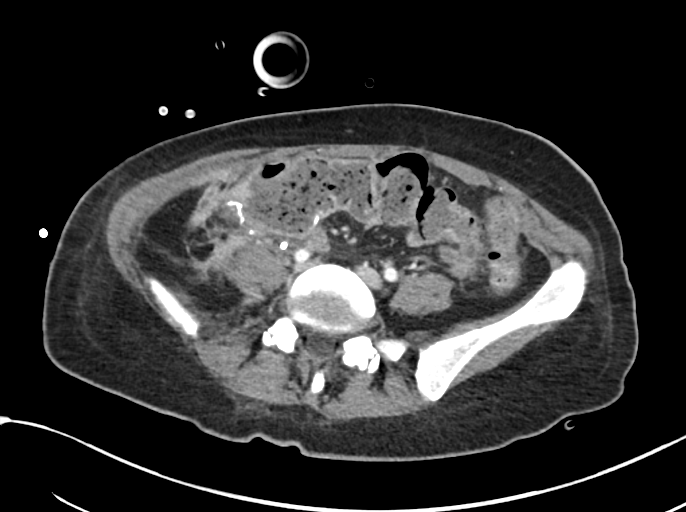
[im 51/92  soft-tissue]
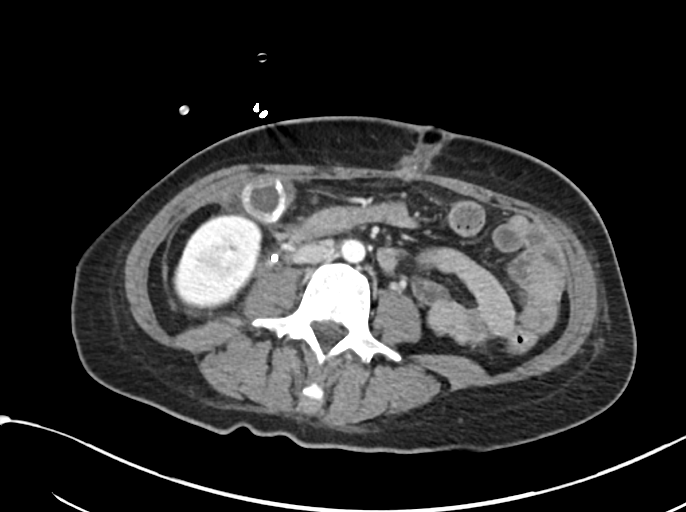
[im 62/92  soft-tissue]
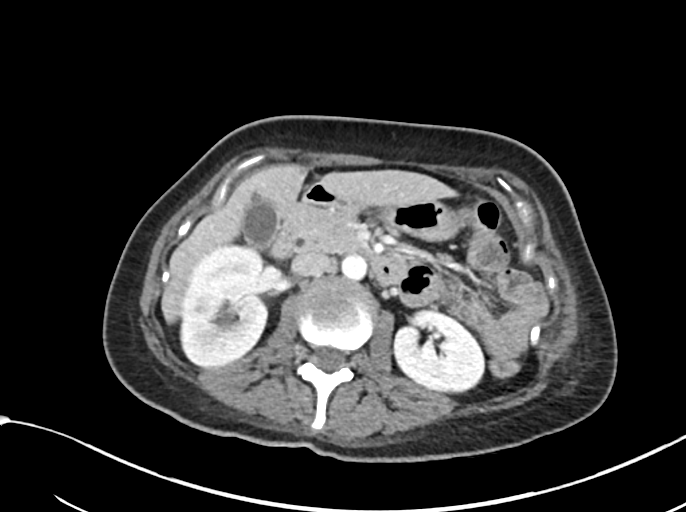
[im 73/92  soft-tissue]
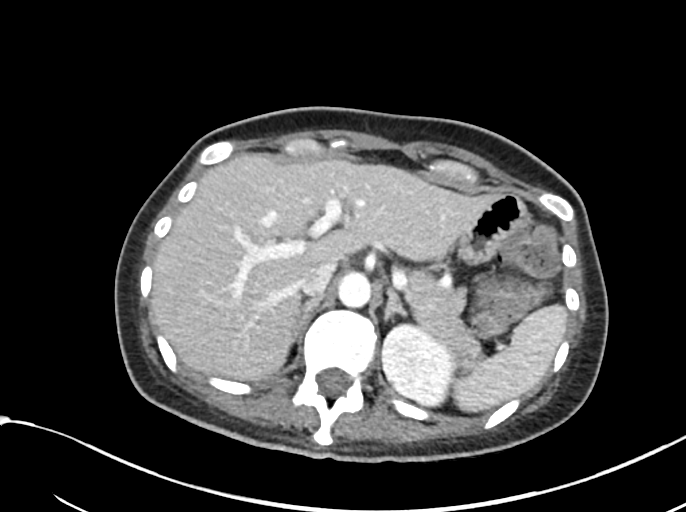
[im 84/92  soft-tissue]
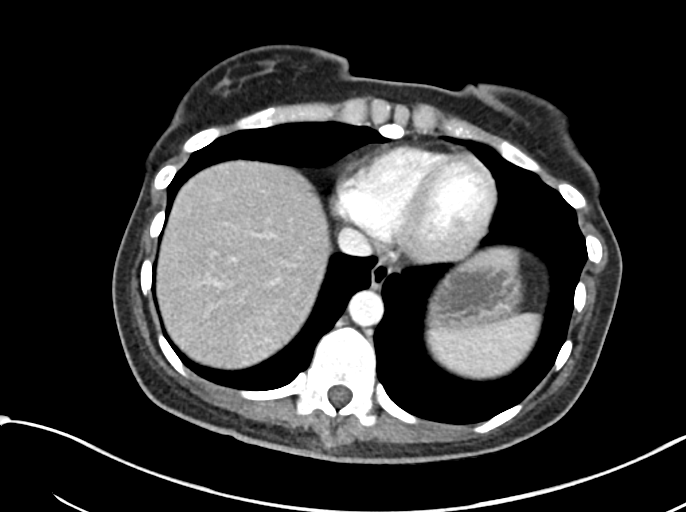

[Series 6: abd pelvis 2.00 br40 s3 cor · coronal · 0.63mm/px · 3 of 140 slices shown]
[im 47/140  soft-tissue]
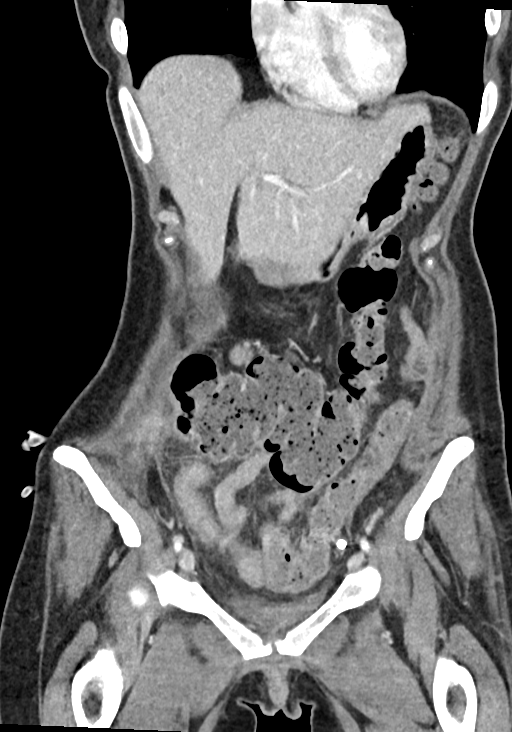
[im 62/140  soft-tissue]
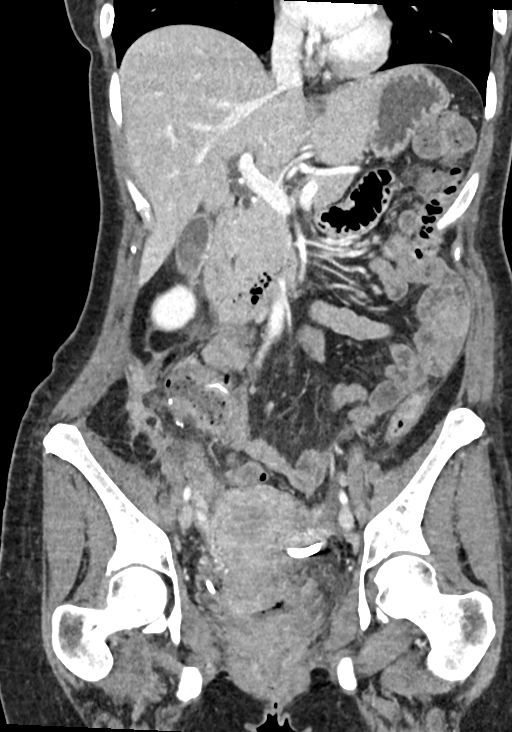
[im 78/140  soft-tissue]
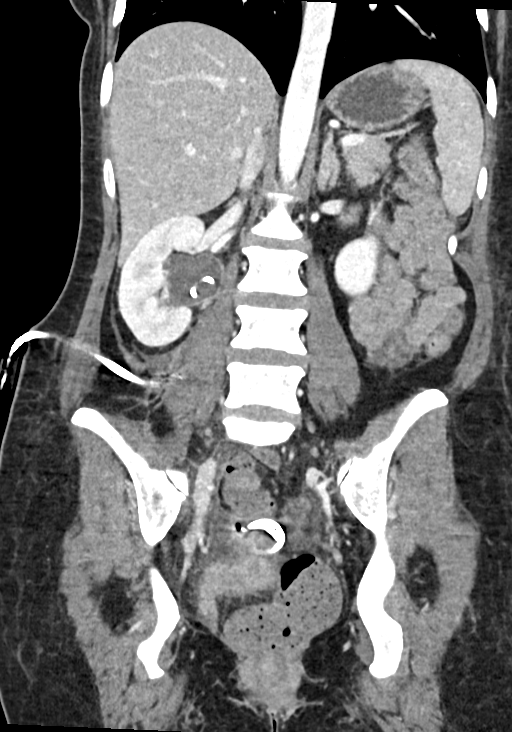

[11 of 46 positions shown; findings below may reference images not displayed]

FINDINGS: Lower chest: The lung bases are clear. Visualized cardiac structures
are within normal limits for size. No pericardial effusion.
Unremarkable visualized distal thoracic esophagus.

Hepatobiliary: Cholelithiasis without evidence of intra or
extrahepatic biliary ductal dilatation. Normal hepatic contour
morphology. No discrete hepatic lesion.

Pancreas: Unremarkable. No pancreatic ductal dilatation or
surrounding inflammatory changes.

Spleen: Normal in size without focal abnormality.

Adrenals/Urinary Tract: Normal adrenal glands. The left kidney is
normal in appearance. Persistent mild to moderate right-sided
hydronephrosis despite the presence of a well-positioned right
double-J ureteral stent.

Stomach/Bowel: Surgical changes of prior right hemicolectomy. No
evidence of focal bowel wall thickening or obstruction.

Vascular/Lymphatic: No significant vascular findings are present. No
enlarged abdominal or pelvic lymph nodes.

Reproductive: The uterus and left adnexa are unremarkable.

Other: 3 percutaneous drainage catheters remain in position; 1 in
the right hemiabdomen adjacent to the right hemicolectomy site and
right ureteral stent, 1 in the pelvic cul-de-sac via a right-sided
trans gluteal approach, and 1 in the left lower quadrant. The
previously noted abscess collections have essentially completely
resolved. There may be a trace pocket of fluid in the right lower
quadrant which measures no more than 1.7 x 0.5 cm. No residual
undrained fluid collections are present. No evidence of hernia.

Musculoskeletal: No acute fracture or aggressive appearing lytic or
blastic osseous lesion.
IMPRESSION: Significant improvement with near complete resolution of all
previously noted intra-abdominal fluid collections.

Persistent mild to moderate right-sided hydronephrosis despite the
presence of a well-positioned double-J ureteral stent. This may
represent poor functioning of the ureteral stent, or residual
capacious notice of the collecting system secondary to longstanding
hydronephrosis.

Cholelithiasis.

## 2019-03-22 ENCOUNTER — Encounter: Payer: BLUE CROSS/BLUE SHIELD | Admitting: Nurse Practitioner

## 2019-03-23 ENCOUNTER — Ambulatory Visit (INDEPENDENT_AMBULATORY_CARE_PROVIDER_SITE_OTHER): Payer: BC Managed Care – PPO | Admitting: Nurse Practitioner

## 2019-03-23 ENCOUNTER — Other Ambulatory Visit: Payer: Self-pay

## 2019-03-23 ENCOUNTER — Encounter: Payer: Self-pay | Admitting: Nurse Practitioner

## 2019-03-23 VITALS — BP 117/77 | HR 83 | Temp 98.4°F | Ht 64.0 in | Wt 150.0 lb

## 2019-03-23 DIAGNOSIS — Z0000 Encounter for general adult medical examination without abnormal findings: Secondary | ICD-10-CM | POA: Diagnosis not present

## 2019-03-23 NOTE — Patient Instructions (Signed)
Exercising to Stay Healthy To become healthy and stay healthy, it is recommended that you do moderate-intensity and vigorous-intensity exercise. You can tell that you are exercising at a moderate intensity if your heart starts beating faster and you start breathing faster but can still hold a conversation. You can tell that you are exercising at a vigorous intensity if you are breathing much harder and faster and cannot hold a conversation while exercising. Exercising regularly is important. It has many health benefits, such as:  Improving overall fitness, flexibility, and endurance.  Increasing bone density.  Helping with weight control.  Decreasing body fat.  Increasing muscle strength.  Reducing stress and tension.  Improving overall health. How often should I exercise? Choose an activity that you enjoy, and set realistic goals. Your health care provider can help you make an activity plan that works for you. Exercise regularly as told by your health care provider. This may include:  Doing strength training two times a week, such as: ? Lifting weights. ? Using resistance bands. ? Push-ups. ? Sit-ups. ? Yoga.  Doing a certain intensity of exercise for a given amount of time. Choose from these options: ? A total of 150 minutes of moderate-intensity exercise every week. ? A total of 75 minutes of vigorous-intensity exercise every week. ? A mix of moderate-intensity and vigorous-intensity exercise every week. Children, pregnant women, people who have not exercised regularly, people who are overweight, and older adults may need to talk with a health care provider about what activities are safe to do. If you have a medical condition, be sure to talk with your health care provider before you start a new exercise program. What are some exercise ideas? Moderate-intensity exercise ideas include:  Walking 1 mile (1.6 km) in about 15 minutes.  Biking.  Hiking.  Golfing.  Dancing.   Water aerobics. Vigorous-intensity exercise ideas include:  Walking 4.5 miles (7.2 km) or more in about 1 hour.  Jogging or running 5 miles (8 km) in about 1 hour.  Biking 10 miles (16.1 km) or more in about 1 hour.  Lap swimming.  Roller-skating or in-line skating.  Cross-country skiing.  Vigorous competitive sports, such as football, basketball, and soccer.  Jumping rope.  Aerobic dancing. What are some everyday activities that can help me to get exercise?  Yard work, such as: ? Pushing a lawn mower. ? Raking and bagging leaves.  Washing your car.  Pushing a stroller.  Shoveling snow.  Gardening.  Washing windows or floors. How can I be more active in my day-to-day activities?  Use stairs instead of an elevator.  Take a walk during your lunch break.  If you drive, park your car farther away from your work or school.  If you take public transportation, get off one stop early and walk the rest of the way.  Stand up or walk around during all of your indoor phone calls.  Get up, stretch, and walk around every 30 minutes throughout the day.  Enjoy exercise with a friend. Support to continue exercising will help you keep a regular routine of activity. What guidelines can I follow while exercising?  Before you start a new exercise program, talk with your health care provider.  Do not exercise so much that you hurt yourself, feel dizzy, or get very short of breath.  Wear comfortable clothes and wear shoes with good support.  Drink plenty of water while you exercise to prevent dehydration or heat stroke.  Work out until your breathing   and your heartbeat get faster. Where to find more information  [place] Department of Health and Human Services: www.hhs.gov  Centers for Disease Control and Prevention (CDC): www.cdc.gov Summary  Exercising regularly is important. It will improve your overall fitness, flexibility, and endurance.  Regular exercise also will  improve your overall health. It can help you control your weight, reduce stress, and improve your bone density.  Do not exercise so much that you hurt yourself, feel dizzy, or get very short of breath.  Before you start a new exercise program, talk with your health care provider. This information is not intended to replace advice given to you by your health care provider. Make sure you discuss any questions you have with your health care provider. Document Released: 09/07/2010 Document Revised: 07/18/2017 Document Reviewed: 06/26/2017 [employer]vier Patient Education  2020 [employer]  

## 2019-03-23 NOTE — Progress Notes (Signed)
Subjective:    Patient ID: Erica Ross, female    DOB: 04-10-1976, 43 y.o.   MRN: [id number]   Chief Complaint: Annual Exam (Place on chest)    HPI: Patient comes in today for annual physical exam. She has no chronic medical problems and is on no meds. She has no complaints today.   Outpatient Encounter Medications as of 03/23/2019  Medication Sig  . acetaminophen (TYLENOL) 500 MG tablet Take 500-1,000 mg by mouth daily as needed for moderate pain.    Past Surgical History:  Procedure Laterality Date  . CYSTOSCOPY WITH STENT PLACEMENT Bilateral 04/29/2018   Procedure: CYSTOSCOPY WITH  BILATERAL RETROGRADE PYELOGRAM RIGHT STENT PLACEMENT;  Surgeon: Ardis Hughs, MD;  Location: [facility];  Service: Urology;  Laterality: Bilateral;  . IR RADIOLOGIST EVAL & MGMT  05/27/2018  . LAPAROSCOPIC REMOVAL ABDOMINAL MASS N/A 04/29/2018   Procedure: LAPAROSCOPIC RIGHT HEMI-COLECTOMY ERAS PATHWAY;  Surgeon: Stark Klein, MD;  Location: [facility];  Service: General;  Laterality: N/A;    Family History  Problem Relation Age of Onset  . Diabetes Mother   . Aneurysm Father   . Diabetes Brother   . Breast cancer Paternal Grandmother   . Breast cancer Other     New complaints: None   Social history: Lives with husband and 3 kids- husband is a Glass blower/designer substance contract: N/A    Review of Systems  Constitutional: Negative for activity change and appetite change.  HENT: Negative.   Eyes: Negative for pain.  Respiratory: Negative for shortness of breath.   Cardiovascular: Negative for chest pain, palpitations and leg swelling.  Gastrointestinal: Negative for abdominal pain.  Endocrine: Negative for polydipsia.  Genitourinary: Negative.   Skin: Negative for rash.  Neurological: Negative for dizziness, weakness and headaches.  Hematological: Does not bruise/bleed easily.  Psychiatric/Behavioral: Negative.   All other systems reviewed and are negative.      Objective:    Physical Exam Vitals signs and nursing note reviewed.  Constitutional:      General: She is not in acute distress.    Appearance: Normal appearance. She is well-developed.  HENT:     Head: Normocephalic.     Nose: Nose normal.  Eyes:     Pupils: Pupils are equal, round, and reactive to light.  Neck:     Musculoskeletal: Normal range of motion and neck supple.     Vascular: No carotid bruit or JVD.  Cardiovascular:     Rate and Rhythm: Normal rate and regular rhythm.     Heart sounds: Normal heart sounds.  Pulmonary:     Effort: Pulmonary effort is normal. No respiratory distress.     Breath sounds: Normal breath sounds. No wheezing or rales.  Chest:     Chest wall: No tenderness.  Abdominal:     General: Bowel sounds are normal. There is no distension or abdominal bruit.     Palpations: Abdomen is soft. There is no hepatomegaly, splenomegaly, mass or pulsatile mass.     Tenderness: There is no abdominal tenderness.  Musculoskeletal: Normal range of motion.  Lymphadenopathy:     Cervical: No cervical adenopathy.  Skin:    General: Skin is warm and dry.  Neurological:     Mental Status: She is alert and oriented to person, place, and time.     Deep Tendon Reflexes: Reflexes are normal and symmetric.  Psychiatric:        Behavior: Behavior normal.  Thought Content: Thought content normal.        Judgment: Judgment normal.    BP 117/77   Pulse 83   Temp 98.4 F (36.9 C) (Oral)   Ht '5\' 4"'  (1.626 m)   Wt 150 lb (68 kg)   BMI 25.75 kg/m       Assessment & Plan:  Erica Ross comes in today with chief complaint of Annual Exam (Place on chest)   Diagnosis and orders addressed:  1. Annual physical exam - CBC with Differential/Platelet - CMP14+EGFR - Lipid panel - Thyroid Panel With TSH - VITAMIN D 25 Hydroxy (Vit-D Deficiency, Fractures)   Labs pending Health Maintenance reviewed Diet and exercise encouraged  Follow up plan: 1 year    Mary-Margaret Hassell Done, FNP

## 2019-03-24 LAB — CBC WITH DIFFERENTIAL/PLATELET
Basophils Absolute: 0.1 10*3/uL (ref 0.0–0.2)
Basos: 1 %
EOS (ABSOLUTE): 0.3 10*3/uL (ref 0.0–0.4)
Eos: 4 %
Hematocrit: 41.7 % (ref 34.0–46.6)
Hemoglobin: 13.6 g/dL (ref 11.1–15.9)
Immature Grans (Abs): 0 10*3/uL (ref 0.0–0.1)
Immature Granulocytes: 0 %
Lymphocytes Absolute: 2.5 10*3/uL (ref 0.7–3.1)
Lymphs: 36 %
MCH: 30 pg (ref 26.6–33.0)
MCHC: 32.6 g/dL (ref 31.5–35.7)
MCV: 92 fL (ref 79–97)
Monocytes Absolute: 0.6 10*3/uL (ref 0.1–0.9)
Monocytes: 9 %
Neutrophils Absolute: 3.5 10*3/uL (ref 1.4–7.0)
Neutrophils: 50 %
Platelets: 251 10*3/uL (ref 150–450)
RBC: 4.54 x10E6/uL (ref 3.77–5.28)
RDW: 12.1 % (ref 11.7–15.4)
WBC: 6.9 10*3/uL (ref 3.4–10.8)

## 2019-03-24 LAB — CMP14+EGFR
ALT: 13 IU/L (ref 0–32)
AST: 19 IU/L (ref 0–40)
Albumin/Globulin Ratio: 1.6 (ref 1.2–2.2)
Albumin: 4.2 g/dL (ref 3.8–4.8)
Alkaline Phosphatase: 59 IU/L (ref 39–117)
BUN/Creatinine Ratio: 17 (ref 9–23)
BUN: 13 mg/dL (ref 6–24)
Bilirubin Total: 0.3 mg/dL (ref 0.0–1.2)
CO2: 22 mmol/L (ref 20–29)
Calcium: 9 mg/dL (ref 8.7–10.2)
Chloride: 101 mmol/L (ref 96–106)
Creatinine, Ser: 0.78 mg/dL (ref 0.57–1.00)
GFR calc Af Amer: 108 mL/min/{1.73_m2} (ref >59)
GFR calc non Af Amer: 93 mL/min/{1.73_m2} (ref >59)
Globulin, Total: 2.6 g/dL (ref 1.5–4.5)
Glucose: 95 mg/dL (ref 65–99)
Potassium: 4.8 mmol/L (ref 3.5–5.2)
Sodium: 139 mmol/L (ref 134–144)
Total Protein: 6.8 g/dL (ref 6.0–8.5)

## 2019-03-24 LAB — LIPID PANEL
Chol/HDL Ratio: 3.1 ratio (ref 0.0–4.4)
Cholesterol, Total: 166 mg/dL (ref 100–199)
HDL: 53 mg/dL (ref >39)
LDL Calculated: 95 mg/dL (ref 0–99)
Triglycerides: 89 mg/dL (ref 0–149)
VLDL Cholesterol Cal: 18 mg/dL (ref 5–40)

## 2019-03-24 LAB — VITAMIN D 25 HYDROXY (VIT D DEFICIENCY, FRACTURES): Vit D, 25-Hydroxy: 55.5 ng/mL (ref 30.0–100.0)

## 2019-03-24 LAB — THYROID PANEL WITH TSH
Free Thyroxine Index: 1.7 (ref 1.2–4.9)
T3 Uptake Ratio: 27 % (ref 24–39)
T4, Total: 6.4 ug/dL (ref 4.5–12.0)
TSH: 2.4 u[IU]/mL (ref 0.450–4.500)

## 2020-06-29 ENCOUNTER — Encounter: Payer: BC Managed Care – PPO | Admitting: Nurse Practitioner

## 2020-07-07 ENCOUNTER — Telehealth: Payer: Self-pay

## 2020-07-07 NOTE — Telephone Encounter (Signed)
Pt ate left over [place] food on Wed and is wondering if she has food poisoning, She wants to talk to nurse about symptoms and if she is ok. Please call back

## 2020-07-12 NOTE — Telephone Encounter (Signed)
Lmtcb  No call back - this encounter will be closed  

## 2020-07-24 ENCOUNTER — Encounter: Payer: Self-pay | Admitting: Nurse Practitioner

## 2020-07-24 ENCOUNTER — Other Ambulatory Visit (HOSPITAL_COMMUNITY)
Admission: RE | Admit: 2020-07-24 | Discharge: 2020-07-24 | Disposition: A | Discharge status: Home / Self Care | Payer: BC Managed Care – PPO | Source: Ambulatory Visit | Attending: Nurse Practitioner | Admitting: Nurse Practitioner

## 2020-07-24 ENCOUNTER — Ambulatory Visit (INDEPENDENT_AMBULATORY_CARE_PROVIDER_SITE_OTHER): Payer: BC Managed Care – PPO | Admitting: Nurse Practitioner

## 2020-07-24 ENCOUNTER — Other Ambulatory Visit: Payer: Self-pay

## 2020-07-24 VITALS — BP 126/81 | HR 73 | Temp 97.7°F | Resp 20 | Ht 64.0 in | Wt 161.0 lb

## 2020-07-24 DIAGNOSIS — Z0000 Encounter for general adult medical examination without abnormal findings: Secondary | ICD-10-CM

## 2020-07-24 DIAGNOSIS — Z136 Encounter for screening for cardiovascular disorders: Secondary | ICD-10-CM | POA: Diagnosis not present

## 2020-07-24 DIAGNOSIS — Z01419 Encounter for gynecological examination (general) (routine) without abnormal findings: Secondary | ICD-10-CM | POA: Insufficient documentation

## 2020-07-24 LAB — URINALYSIS, COMPLETE
Bilirubin, UA: NEGATIVE
Glucose, UA: NEGATIVE
Ketones, UA: NEGATIVE
Leukocytes,UA: NEGATIVE
Nitrite, UA: NEGATIVE
Protein,UA: NEGATIVE
RBC, UA: NEGATIVE
Specific Gravity, UA: 1.015 (ref 1.005–1.030)
Urobilinogen, Ur: 0.2 mg/dL (ref 0.2–1.0)
pH, UA: 7 (ref 5.0–7.5)

## 2020-07-24 LAB — MICROSCOPIC EXAMINATION

## 2020-07-24 NOTE — Progress Notes (Signed)
   Subjective:    Patient ID: Erica Ross, female    DOB: 1976-01-13, 44 y.o.   MRN: [id number]   Chief Complaint: annual physical  HPI Patient come sin today for physical exam. She is doing well. Has no complaints today  Review of Systems  Constitutional: Negative for diaphoresis.  Eyes: Negative for pain.  Respiratory: Negative for shortness of breath.   Cardiovascular: Negative for chest pain, palpitations and leg swelling.  Gastrointestinal: Negative for abdominal pain.  Endocrine: Negative for polydipsia.  Skin: Negative for rash.  Neurological: Negative for dizziness, weakness and headaches.  Hematological: Does not bruise/bleed easily.  All other systems reviewed and are negative.      Objective:   Physical Exam Vitals and nursing note reviewed.  Constitutional:      General: She is not in acute distress.    Appearance: Normal appearance. She is well-developed.  HENT:     Head: Normocephalic.     Nose: Nose normal.  Eyes:     Pupils: Pupils are equal, round, and reactive to light.  Neck:     Vascular: No carotid bruit or JVD.  Cardiovascular:     Rate and Rhythm: Normal rate and regular rhythm.     Heart sounds: Normal heart sounds.  Pulmonary:     Effort: Pulmonary effort is normal. No respiratory distress.     Breath sounds: Normal breath sounds. No wheezing or rales.  Chest:     Chest wall: No tenderness.  Abdominal:     General: Bowel sounds are normal. There is no distension or abdominal bruit.     Palpations: Abdomen is soft. There is no hepatomegaly, splenomegaly, mass or pulsatile mass.     Tenderness: There is no abdominal tenderness.  Genitourinary:    General: Normal vulva.     Vagina: No vaginal discharge.     Rectum: Normal.     Comments: cervix parous and pin No adnexal masses or tenderness Musculoskeletal:        General: Normal range of motion.     Cervical back: Normal range of motion and neck supple.  Lymphadenopathy:     Cervical:  No cervical adenopathy.  Skin:    General: Skin is warm and dry.  Neurological:     Mental Status: She is alert and oriented to person, place, and time.     Deep Tendon Reflexes: Reflexes are normal and symmetric.  Psychiatric:        Behavior: Behavior normal.        Thought Content: Thought content normal.        Judgment: Judgment normal.    BP 126/81   Pulse 73   Temp 97.7 F (36.5 C) (Temporal)   Resp 20   Ht _0  (1.626 m)   Wt 161 lb (73 kg)   SpO2 100%   BMI 27.64 kg/m          Assessment & Plan:  DMIYAH LISCANO comes in today with chief complaint of Annual Exam   Diagnosis and orders addressed:  1. Annual physical exam - Urinalysis, Complete - CBC with Differential/Platelet - CMP14+EGFR - Lipid panel - Thyroid Panel With TSH - Cytology - PAP   Labs pending Health Maintenance reviewed Diet and exercise encouraged  Follow up plan: 1 year   Mary-Margaret Hassell Done, FNP

## 2020-07-24 NOTE — Patient Instructions (Signed)
Exercising to Stay Healthy To become healthy and stay healthy, it is recommended that you do moderate-intensity and vigorous-intensity exercise. You can tell that you are exercising at a moderate intensity if your heart starts beating faster and you start breathing faster but can still hold a conversation. You can tell that you are exercising at a vigorous intensity if you are breathing much harder and faster and cannot hold a conversation while exercising. Exercising regularly is important. It has many health benefits, such as:  Improving overall fitness, flexibility, and endurance.  Increasing bone density.  Helping with weight control.  Decreasing body fat.  Increasing muscle strength.  Reducing stress and tension.  Improving overall health. How often should I exercise? Choose an activity that you enjoy, and set realistic goals. Your health care provider can help you make an activity plan that works for you. Exercise regularly as told by your health care provider. This may include:  Doing strength training two times a week, such as: ? Lifting weights. ? Using resistance bands. ? Push-ups. ? Sit-ups. ? Yoga.  Doing a certain intensity of exercise for a given amount of time. Choose from these options: ? A total of 150 minutes of moderate-intensity exercise every week. ? A total of 75 minutes of vigorous-intensity exercise every week. ? A mix of moderate-intensity and vigorous-intensity exercise every week. Children, pregnant women, people who have not exercised regularly, people who are overweight, and older adults may need to talk with a health care provider about what activities are safe to do. If you have a medical condition, be sure to talk with your health care provider before you start a new exercise program. What are some exercise ideas? Moderate-intensity exercise ideas include:  Walking 1 mile (1.6 km) in about 15  minutes.  Biking.  Hiking.  Golfing.  Dancing.  Water aerobics. Vigorous-intensity exercise ideas include:  Walking 4.5 miles (7.2 km) or more in about 1 hour.  Jogging or running 5 miles (8 km) in about 1 hour.  Biking 10 miles (16.1 km) or more in about 1 hour.  Lap swimming.  Roller-skating or in-line skating.  Cross-country skiing.  Vigorous competitive sports, such as football, basketball, and soccer.  Jumping rope.  Aerobic dancing. What are some everyday activities that can help me to get exercise?  Yard work, such as: ? Pushing a lawn mower. ? Raking and bagging leaves.  Washing your car.  Pushing a stroller.  Shoveling snow.  Gardening.  Washing windows or floors. How can I be more active in my day-to-day activities?  Use stairs instead of an elevator.  Take a walk during your lunch break.  If you drive, park your car farther away from your work or school.  If you take public transportation, get off one stop early and walk the rest of the way.  Stand up or walk around during all of your indoor phone calls.  Get up, stretch, and walk around every 30 minutes throughout the day.  Enjoy exercise with a friend. Support to continue exercising will help you keep a regular routine of activity. What guidelines can I follow while exercising?  Before you start a new exercise program, talk with your health care provider.  Do not exercise so much that you hurt yourself, feel dizzy, or get very short of breath.  Wear comfortable clothes and wear shoes with good support.  Drink plenty of water while you exercise to prevent dehydration or heat stroke.  Work out until your breathing   and your heartbeat get faster. Where to find more information  [place] Department of Health and Human Services: www.hhs.gov  Centers for Disease Control and Prevention (CDC): www.cdc.gov Summary  Exercising regularly is important. It will improve your overall fitness,  flexibility, and endurance.  Regular exercise also will improve your overall health. It can help you control your weight, reduce stress, and improve your bone density.  Do not exercise so much that you hurt yourself, feel dizzy, or get very short of breath.  Before you start a new exercise program, talk with your health care provider. This information is not intended to replace advice given to you by your health care provider. Make sure you discuss any questions you have with your health care provider. Document Revised: 07/18/2017 Document Reviewed: 06/26/2017 Elsevier Patient Education  2020 Elsevier Inc.  

## 2020-07-24 NOTE — Addendum Note (Signed)
Addended by: Chevis Pretty on: 07/24/2020 03:45 PM   Modules accepted: Orders

## 2020-07-25 LAB — LIPID PANEL
Chol/HDL Ratio: 3.5 ratio (ref 0.0–4.4)
Cholesterol, Total: 200 mg/dL — ABNORMAL HIGH (ref 100–199)
HDL: 57 mg/dL (ref >39)
LDL Chol Calc (NIH): 131 mg/dL — ABNORMAL HIGH (ref 0–99)
Triglycerides: 66 mg/dL (ref 0–149)
VLDL Cholesterol Cal: 12 mg/dL (ref 5–40)

## 2020-07-25 LAB — CMP14+EGFR
ALT: 12 IU/L (ref 0–32)
AST: 16 IU/L (ref 0–40)
Albumin/Globulin Ratio: 1.5 (ref 1.2–2.2)
Albumin: 4.4 g/dL (ref 3.8–4.8)
Alkaline Phosphatase: 87 IU/L (ref 44–121)
BUN/Creatinine Ratio: 21 (ref 9–23)
BUN: 13 mg/dL (ref 6–24)
Bilirubin Total: <0.2 mg/dL (ref 0.0–1.2)
CO2: 25 mmol/L (ref 20–29)
Calcium: 9.4 mg/dL (ref 8.7–10.2)
Chloride: 99 mmol/L (ref 96–106)
Creatinine, Ser: 0.62 mg/dL (ref 0.57–1.00)
GFR calc Af Amer: 127 mL/min/{1.73_m2} (ref >59)
GFR calc non Af Amer: 110 mL/min/{1.73_m2} (ref >59)
Globulin, Total: 3 g/dL (ref 1.5–4.5)
Glucose: 98 mg/dL (ref 65–99)
Potassium: 4.1 mmol/L (ref 3.5–5.2)
Sodium: 137 mmol/L (ref 134–144)
Total Protein: 7.4 g/dL (ref 6.0–8.5)

## 2020-07-25 LAB — CBC WITH DIFFERENTIAL/PLATELET
Basophils Absolute: 0.1 10*3/uL (ref 0.0–0.2)
Basos: 1 %
EOS (ABSOLUTE): 0.2 10*3/uL (ref 0.0–0.4)
Eos: 3 %
Hematocrit: 39.7 % (ref 34.0–46.6)
Hemoglobin: 13 g/dL (ref 11.1–15.9)
Immature Grans (Abs): 0 10*3/uL (ref 0.0–0.1)
Immature Granulocytes: 0 %
Lymphocytes Absolute: 3.3 10*3/uL — ABNORMAL HIGH (ref 0.7–3.1)
Lymphs: 37 %
MCH: 29.2 pg (ref 26.6–33.0)
MCHC: 32.7 g/dL (ref 31.5–35.7)
MCV: 89 fL (ref 79–97)
Monocytes Absolute: 0.7 10*3/uL (ref 0.1–0.9)
Monocytes: 8 %
Neutrophils Absolute: 4.6 10*3/uL (ref 1.4–7.0)
Neutrophils: 51 %
Platelets: 277 10*3/uL (ref 150–450)
RBC: 4.45 x10E6/uL (ref 3.77–5.28)
RDW: 12.1 % (ref 11.7–15.4)
WBC: 8.9 10*3/uL (ref 3.4–10.8)

## 2020-07-25 LAB — THYROID PANEL WITH TSH
Free Thyroxine Index: 1.4 (ref 1.2–4.9)
T3 Uptake Ratio: 24 % (ref 24–39)
T4, Total: 5.8 ug/dL (ref 4.5–12.0)
TSH: 2.4 u[IU]/mL (ref 0.450–4.500)

## 2020-07-25 LAB — VITAMIN D 25 HYDROXY (VIT D DEFICIENCY, FRACTURES): Vit D, 25-Hydroxy: 99.3 ng/mL (ref 30.0–100.0)

## 2020-07-26 LAB — CYTOLOGY - PAP
Chlamydia: NEGATIVE
Comment: NEGATIVE
Comment: NEGATIVE
Comment: NORMAL
Diagnosis: NEGATIVE
Neisseria Gonorrhea: NEGATIVE
Trichomonas: NEGATIVE

## 2021-01-25 ENCOUNTER — Other Ambulatory Visit: Payer: Self-pay | Admitting: Nurse Practitioner

## 2021-01-25 DIAGNOSIS — Z1231 Encounter for screening mammogram for malignant neoplasm of breast: Secondary | ICD-10-CM

## 2021-03-21 ENCOUNTER — Ambulatory Visit: Payer: BC Managed Care – PPO

## 2022-03-13 DIAGNOSIS — R319 Hematuria, unspecified: Secondary | ICD-10-CM | POA: Diagnosis not present

## 2022-03-21 ENCOUNTER — Ambulatory Visit: Payer: BC Managed Care – PPO | Admitting: Family

## 2022-03-21 ENCOUNTER — Encounter: Payer: Self-pay | Admitting: Family

## 2022-03-21 VITALS — BP 130/87 | HR 90 | Temp 98.4°F | Ht 64.0 in | Wt 161.0 lb

## 2022-03-21 DIAGNOSIS — N3001 Acute cystitis with hematuria: Secondary | ICD-10-CM

## 2022-03-21 DIAGNOSIS — R109 Unspecified abdominal pain: Secondary | ICD-10-CM

## 2022-03-21 DIAGNOSIS — Z09 Encounter for follow-up examination after completed treatment for conditions other than malignant neoplasm: Secondary | ICD-10-CM

## 2022-03-21 LAB — URINALYSIS, COMPLETE
Bilirubin, UA: NEGATIVE
Glucose, UA: NEGATIVE
Nitrite, UA: NEGATIVE
Specific Gravity, UA: >1.03 — ABNORMAL HIGH (ref 1.005–1.030)
Urobilinogen, Ur: 0.2 mg/dL (ref 0.2–1.0)
pH, UA: 6 (ref 5.0–7.5)

## 2022-03-21 LAB — MICROSCOPIC EXAMINATION
Renal Epithel, UA: NONE SEEN /hpf
WBC, UA: >30 /hpf — AB (ref 0–5)

## 2022-03-21 MED ORDER — SULFAMETHOXAZOLE-TRIMETHOPRIM 800-160 MG PO TABS: 1.0000 | ORAL_TABLET | Freq: Two times a day (BID) | ORAL | 0 refills | Status: DC

## 2022-03-21 NOTE — Patient Instructions (Signed)
Urinary Tract Infection, Adult  A urinary tract infection (UTI) is an infection of any part of the urinary tract. The urinary tract includes the kidneys, ureters, bladder, and urethra. These organs make, store, and get rid of urine in the body. An upper UTI affects the ureters and kidneys. A lower UTI affects the bladder and urethra. What are the causes? Most urinary tract infections are caused by bacteria in your genital area around your urethra, where urine leaves your body. These bacteria grow and cause inflammation of your urinary tract. What increases the risk? You are more likely to develop this condition if: You have a urinary catheter that stays in place. You are not able to control when you urinate or have a bowel movement (incontinence). You are female and you: Use a spermicide or diaphragm for birth control. Have low estrogen levels. Are pregnant. You have certain genes that increase your risk. You are sexually active. You take antibiotic medicines. You have a condition that causes your flow of urine to slow down, such as: An enlarged prostate, if you are female. Blockage in your urethra. A kidney stone. A nerve condition that affects your bladder control (neurogenic bladder). Not getting enough to drink, or not urinating often. You have certain medical conditions, such as: Diabetes. A weak disease-fighting system (immunesystem). Sickle cell disease. Gout. Spinal cord injury. What are the signs or symptoms? Symptoms of this condition include: Needing to urinate right away (urgency). Frequent urination. This may include small amounts of urine each time you urinate. Pain or burning with urination. Blood in the urine. Urine that smells bad or unusual. Trouble urinating. Cloudy urine. Vaginal discharge, if you are female. Pain in the abdomen or the lower back. You may also have: Vomiting or a decreased appetite. Confusion. Irritability or tiredness. A fever or  chills. Diarrhea. The first symptom in older adults may be confusion. In some cases, they may not have any symptoms until the infection has worsened. How is this diagnosed? This condition is diagnosed based on your medical history and a physical exam. You may also have other tests, including: Urine tests. Blood tests. Tests for STIs (sexually transmitted infections). If you have had more than one UTI, a cystoscopy or imaging studies may be done to determine the cause of the infections. How is this treated? Treatment for this condition includes: Antibiotic medicine. Over-the-counter medicines to treat discomfort. Drinking enough water to stay hydrated. If you have frequent infections or have other conditions such as a kidney stone, you may need to see a health care provider who specializes in the urinary tract (urologist). In rare cases, urinary tract infections can cause sepsis. Sepsis is a life-threatening condition that occurs when the body responds to an infection. Sepsis is treated in the hospital with IV antibiotics, fluids, and other medicines. Follow these instructions at home:  Medicines Take over-the-counter and prescription medicines only as told by your health care provider. If you were prescribed an antibiotic medicine, take it as told by your health care provider. Do not stop using the antibiotic even if you start to feel better. General instructions Make sure you: Empty your bladder often and completely. Do not hold urine for long periods of time. Empty your bladder after sex. Wipe from front to back after urinating or having a bowel movement if you are female. Use each tissue only one time when you wipe. Drink enough fluid to keep your urine pale yellow. Keep all follow-up visits. This is important. Contact a health   care provider if: Your symptoms do not get better after 1-2 days. Your symptoms go away and then return. Get help right away if: You have severe pain in  your back or your lower abdomen. You have a fever or chills. You have nausea or vomiting. Summary A urinary tract infection (UTI) is an infection of any part of the urinary tract, which includes the kidneys, ureters, bladder, and urethra. Most urinary tract infections are caused by bacteria in your genital area. Treatment for this condition often includes antibiotic medicines. If you were prescribed an antibiotic medicine, take it as told by your health care provider. Do not stop using the antibiotic even if you start to feel better. Keep all follow-up visits. This is important. This information is not intended to replace advice given to you by your health care provider. Make sure you discuss any questions you have with your health care provider. Document Revised: 03/17/2020 Document Reviewed: 03/17/2020 Elsevier Patient Education  2023 Elsevier Inc.  

## 2022-03-21 NOTE — Progress Notes (Signed)
Subjective:    Patient ID: Erica Ross, female    DOB: [birth year], 46 y.o.   MRN: [id number]  Chief Complaint  Patient presents with   Back Pain   Urinary Frequency   PT presents to the office today to follow up on hospital for hematuria and black pain. She was given Macrobid and has one more day. Reports she is feeling better, but continues to have nausea and intermittent right back pain.   Her urine was negative for leukocytes and bacteria, but positive for blood.   She is drinking a lot more water and feels like she having urinary frequency.   Has hx of ureteral stent several years ago.   Flank Pain This is a new problem. The current episode started 1 to 4 weeks ago. The problem has been gradually improving since onset. The quality of the pain is described as aching. The pain is at a severity of 3/10. The pain is mild. Pertinent negatives include no dysuria. Treatments tried: macrobid. The treatment provided mild relief.      Review of Systems  Genitourinary:  Positive for flank pain. Negative for dysuria.  All other systems reviewed and are negative.      Objective:   Physical Exam Vitals reviewed.  Constitutional:      General: She is not in acute distress.    Appearance: She is well-developed.  HENT:     Head: Normocephalic and atraumatic.  Eyes:     Pupils: Pupils are equal, round, and reactive to light.  Neck:     Thyroid: No thyromegaly.  Cardiovascular:     Rate and Rhythm: Normal rate and regular rhythm.     Heart sounds: Normal heart sounds. No murmur heard. Pulmonary:     Effort: Pulmonary effort is normal. No respiratory distress.     Breath sounds: Normal breath sounds. No wheezing.  Abdominal:     General: Bowel sounds are normal. There is no distension.     Palpations: Abdomen is soft.     Tenderness: There is no abdominal tenderness.  Musculoskeletal:        General: No tenderness. Normal range of motion.     Cervical back: Normal range of  motion and neck supple.  Skin:    General: Skin is warm and dry.  Neurological:     Mental Status: She is alert and oriented to person, place, and time.     Cranial Nerves: No cranial nerve deficit.     Deep Tendon Reflexes: Reflexes are normal and symmetric.  Psychiatric:        Mood and Affect: Mood is anxious.        Behavior: Behavior normal.        Thought Content: Thought content normal.        Judgment: Judgment normal.       BP 130/87   Pulse 90   Temp 98.4 F (36.9 C) (Temporal)   Ht '5\' 4"'$  (1.626 m)   Wt 161 lb (73 kg)   BMI 27.64 kg/m      Assessment & Plan:  Erica Ross comes in today with chief complaint of Back Pain and Urinary Frequency   Diagnosis and orders addressed:  1. Flank pain - Urinalysis, Complete - Urine Culture  2. Hospital discharge follow-up  3. Acute cystitis with hematuria Force fluids AZO over the counter X2 days RTO in 1 week to recheck. Call Monday if symptoms are worse.  Culture pending - sulfamethoxazole-trimethoprim (BACTRIM DS)  800-160 MG tablet; Take 1 tablet by mouth 2 (two) times daily.  Dispense: 14 tablet; Refill: 0    Evelina Dun, FNP

## 2022-03-22 ENCOUNTER — Telehealth: Payer: Self-pay | Admitting: Nurse Practitioner

## 2022-03-22 MED ORDER — ONDANSETRON HCL 4 MG PO TABS: 4.0000 mg | ORAL_TABLET | Freq: Three times a day (TID) | ORAL | 0 refills | Status: DC | PRN

## 2022-03-22 NOTE — Telephone Encounter (Signed)
Patient aware and verbalized understanding. °

## 2022-03-22 NOTE — Telephone Encounter (Signed)
Zofran Prescription sent to pharmacy  ° °

## 2022-03-23 LAB — URINE CULTURE

## 2022-03-25 ENCOUNTER — Emergency Department (HOSPITAL_COMMUNITY)
Admission: EM | Admit: 2022-03-25 | Discharge: 2022-03-25 | Disposition: A | Discharge status: Home / Self Care | Payer: BC Managed Care – PPO | Attending: Emergency Medicine | Admitting: Emergency Medicine

## 2022-03-25 ENCOUNTER — Emergency Department (HOSPITAL_COMMUNITY): Payer: BC Managed Care – PPO

## 2022-03-25 ENCOUNTER — Other Ambulatory Visit: Payer: Self-pay

## 2022-03-25 ENCOUNTER — Encounter (HOSPITAL_COMMUNITY): Payer: Self-pay

## 2022-03-25 DIAGNOSIS — R109 Unspecified abdominal pain: Secondary | ICD-10-CM

## 2022-03-25 DIAGNOSIS — N132 Hydronephrosis with renal and ureteral calculous obstruction: Secondary | ICD-10-CM | POA: Insufficient documentation

## 2022-03-25 DIAGNOSIS — R002 Palpitations: Secondary | ICD-10-CM | POA: Diagnosis not present

## 2022-03-25 DIAGNOSIS — K429 Umbilical hernia without obstruction or gangrene: Secondary | ICD-10-CM

## 2022-03-25 DIAGNOSIS — N83201 Unspecified ovarian cyst, right side: Secondary | ICD-10-CM

## 2022-03-25 DIAGNOSIS — R1011 Right upper quadrant pain: Secondary | ICD-10-CM | POA: Diagnosis not present

## 2022-03-25 DIAGNOSIS — N8301 Follicular cyst of right ovary: Secondary | ICD-10-CM | POA: Diagnosis not present

## 2022-03-25 DIAGNOSIS — N390 Urinary tract infection, site not specified: Secondary | ICD-10-CM | POA: Diagnosis not present

## 2022-03-25 DIAGNOSIS — N9489 Other specified conditions associated with female genital organs and menstrual cycle: Secondary | ICD-10-CM | POA: Insufficient documentation

## 2022-03-25 DIAGNOSIS — N200 Calculus of kidney: Secondary | ICD-10-CM

## 2022-03-25 DIAGNOSIS — K8020 Calculus of gallbladder without cholecystitis without obstruction: Secondary | ICD-10-CM | POA: Insufficient documentation

## 2022-03-25 DIAGNOSIS — K449 Diaphragmatic hernia without obstruction or gangrene: Secondary | ICD-10-CM | POA: Diagnosis not present

## 2022-03-25 LAB — I-STAT BETA HCG BLOOD, ED (MC, WL, AP ONLY): I-stat hCG, quantitative: <5 m[IU]/mL (ref <5)

## 2022-03-25 LAB — LIPASE, BLOOD: Lipase: 35 U/L (ref 11–51)

## 2022-03-25 LAB — URINALYSIS, ROUTINE W REFLEX MICROSCOPIC
Bilirubin Urine: NEGATIVE
Glucose, UA: NEGATIVE mg/dL
Ketones, ur: 20 mg/dL — AB
Nitrite: NEGATIVE
Protein, ur: NEGATIVE mg/dL
Specific Gravity, Urine: 1.005 (ref 1.005–1.030)
pH: 6 (ref 5.0–8.0)

## 2022-03-25 LAB — CBC
HCT: 40.1 % (ref 36.0–46.0)
Hemoglobin: 13.6 g/dL (ref 12.0–15.0)
MCH: 29.3 pg (ref 26.0–34.0)
MCHC: 33.9 g/dL (ref 30.0–36.0)
MCV: 86.4 fL (ref 80.0–100.0)
Platelets: 259 10*3/uL (ref 150–400)
RBC: 4.64 MIL/uL (ref 3.87–5.11)
RDW: 14.4 % (ref 11.5–15.5)
WBC: 8.2 10*3/uL (ref 4.0–10.5)
nRBC: 0 % (ref 0.0–0.2)

## 2022-03-25 LAB — HEPATIC FUNCTION PANEL
ALT: 18 U/L (ref 0–44)
AST: 19 U/L (ref 15–41)
Albumin: 3.6 g/dL (ref 3.5–5.0)
Alkaline Phosphatase: 59 U/L (ref 38–126)
Bilirubin, Direct: <0.1 mg/dL (ref 0.0–0.2)
Total Bilirubin: 0.5 mg/dL (ref 0.3–1.2)
Total Protein: 7 g/dL (ref 6.5–8.1)

## 2022-03-25 LAB — BASIC METABOLIC PANEL
Anion gap: 10 (ref 5–15)
BUN: 11 mg/dL (ref 6–20)
CO2: 23 mmol/L (ref 22–32)
Calcium: 9 mg/dL (ref 8.9–10.3)
Chloride: 104 mmol/L (ref 98–111)
Creatinine, Ser: 0.68 mg/dL (ref 0.44–1.00)
GFR, Estimated: >60 mL/min (ref >60)
Glucose, Bld: 110 mg/dL — ABNORMAL HIGH (ref 70–99)
Potassium: 3.7 mmol/L (ref 3.5–5.1)
Sodium: 137 mmol/L (ref 135–145)

## 2022-03-25 LAB — T4, FREE: Free T4: 1 ng/dL (ref 0.61–1.12)

## 2022-03-25 LAB — TSH: TSH: 1.158 u[IU]/mL (ref 0.350–4.500)

## 2022-03-25 LAB — TROPONIN I (HIGH SENSITIVITY)
Troponin I (High Sensitivity): 2 ng/L (ref <18)
Troponin I (High Sensitivity): 3 ng/L (ref <18)

## 2022-03-25 MED ORDER — KETOROLAC TROMETHAMINE 60 MG/2ML IM SOLN
60.0000 mg | Freq: Once | INTRAMUSCULAR | Status: DC
  Filled 2022-03-25: qty 2

## 2022-03-25 MED ORDER — LACTATED RINGERS IV BOLUS
1000.0000 mL | Freq: Once | INTRAVENOUS | Status: AC
  Administered 2022-03-25: 1000 mL via INTRAVENOUS

## 2022-03-25 NOTE — Discharge Instructions (Signed)
You were evaluated in the Emergency Department and after careful evaluation, we did not find any emergent condition requiring admission or further testing in the hospital.  Your work-up today was overall reassuring.  You did have evidence of a 1.5 cm stone in the right kidney however this is not moving and should not be causing your pain.  You are also noted to have a right ovarian cyst.  These are usually benign but I would follow-up with your primary care doctor about this.  You  also have stones in your gallbladder, if this becomes symptomatic please follow-up with general surgery whose contact information provided in your discharge paperwork.  Your urine culture is pending however it did not show any acute signs of infection and we will forego changing antibiotics today.  If your urine culture is positive our pharmacy will call in an antibiotic for you.  Please continue to take anti-inflammatories and drink plenty of water.  If you continue to have palpitations, please follow-up with your primary care doctor as you may need further cardiac evaluation.  Please return to the Emergency Department if you experience any worsening of your condition. Thank you for allowing [place] to be a part of your care.

## 2022-03-25 NOTE — ED Notes (Signed)
Provided pt with clean gown.

## 2022-03-25 NOTE — ED Triage Notes (Signed)
Patient reports has been treated for UTI for the past couple weeks and not getting better and then woke up this am with heart racing and pain in right flank.

## 2022-03-25 NOTE — ED Notes (Signed)
Pt called this RN into the room and requested to have her IV taken out. Pt stated "I can just drink some water because I do not feel like this IV is in right." This RN asked pt why she felt that way. Pt stated "Well I know that guy is new and maybe he was nervous but now my arm is swelling and it doesn't feel like any IV I have ever had." This RN flushed the IV and reassured the patient that it was working fine and that arm was not swelling, and that the paramedic who started her IV was very well trained with years of experience. Pt stated oh, well I still would rather have the IV out. This RN spoke with the PA who stated that was fine. Will continue to monitor.

## 2022-03-25 NOTE — ED Provider Notes (Signed)
[facility] EMERGENCY DEPARTMENT Provider Note   CSN: [id number] Arrival date & time: 03/25/22  1779     History  Chief Complaint  Patient presents with   Palpitations   Flank Pain    Erica Ross is a 46 y.o. female.  HPI 46 year old female with a history of umbilical hernia, UTI, renal calculi, GERD, adnexal mass, left breast mass, stones presents to the ER with complaints of right flank pain and palpitations.  The patient was seen at [facility] on 7/26 with complaints of flank pain and hematuria.  She was diagnosed with a UTI and was sent home with ibuprofen nitrofurantoin.  She continued to have nausea and intermittent right back pain, and had been drinking warm water.  She had a ureteral stent several years ago.  She went back to her PCP on 8/3 and was started on Azo, given Bactrim along with Zofran.  I reviewed cultures from that visit and these were negative.  She states that she was feeling much better on the Bactrim however this morning again woke up with flank pain and palpitations.  She stated that over the last several days her heart rate has been going between 120-140 when she has been walking.  She states that she woke up this morning and noticed that her heart rate was in the 140s.  She denies feeling anxious or having pain at the time that her heart rate was elevated.  She has had no cardiac history in the past.  She denies any pelvic pain, no vaginal bleeding or discharge.    Home Medications Prior to Admission medications   Medication Sig Start Date End Date Taking? Authorizing Provider  acetaminophen (TYLENOL) 500 MG tablet Take 500-1,000 mg by mouth daily as needed for moderate pain.    [provider]  ondansetron (ZOFRAN) 4 MG tablet Take 1 tablet (4 mg total) by mouth every 8 (eight) hours as needed for nausea or vomiting. 03/22/22   Evelina Dun A, FNP  sulfamethoxazole-trimethoprim (BACTRIM DS) 800-160 MG tablet Take 1 tablet by  mouth 2 (two) times daily. 03/21/22   Sharion Balloon, FNP      Allergies    Watermelon [citrullus vulgaris] and Amoxicillin    Review of Systems   Review of Systems Ten systems reviewed and are negative for acute change, except as noted in the HPI.   Physical Exam Updated Vital Signs BP 104/72   Pulse 80   Temp 98.1 F (36.7 C) (Oral)   Resp 16   Ht '5\' 4"'  (1.626 m)   Wt 73 kg   SpO2 100%   BMI 27.64 kg/m  Physical Exam Vitals and nursing note reviewed.  Constitutional:      General: She is not in acute distress.    Appearance: She is well-developed.  HENT:     Head: Normocephalic and atraumatic.  Eyes:     Conjunctiva/sclera: Conjunctivae normal.  Cardiovascular:     Rate and Rhythm: Normal rate and regular rhythm.     Heart sounds: No murmur heard. Pulmonary:     Effort: Pulmonary effort is normal. No respiratory distress.     Breath sounds: Normal breath sounds.  Abdominal:     Palpations: Abdomen is soft.     Tenderness: There is no abdominal tenderness. There is no right CVA tenderness or left CVA tenderness.  Musculoskeletal:        General: No swelling or tenderness.     Cervical back: Neck supple.  Skin:    General: Skin is warm and dry.     Capillary Refill: Capillary refill takes less than 2 seconds.  Neurological:     General: No focal deficit present.     Mental Status: She is alert.  Psychiatric:        Mood and Affect: Mood normal.     ED Results / Procedures / Treatments   Labs (all labs ordered are listed, but only abnormal results are displayed) Labs Reviewed  URINALYSIS, ROUTINE W REFLEX MICROSCOPIC - Abnormal; Notable for the following components:      Result Value   Color, Urine STRAW (*)    Hgb urine dipstick SMALL (*)    Ketones, ur 20 (*)    Leukocytes,Ua MODERATE (*)    Bacteria, UA FEW (*)    All other components within normal limits  BASIC METABOLIC PANEL - Abnormal; Notable for the following components:   Glucose, Bld 110  (*)    All other components within normal limits  URINE CULTURE  CBC  TSH  T4, FREE  HEPATIC FUNCTION PANEL  LIPASE, BLOOD  I-STAT BETA HCG BLOOD, ED ([facility], [facility], [facility] ONLY)  TROPONIN I (HIGH SENSITIVITY)  TROPONIN I (HIGH SENSITIVITY)    EKG None  Radiology US Abdomen Limited RUQ (LIVER/GB)  Result Date: 03/25/2022 CLINICAL DATA:  Pain right upper quadrant and right flank EXAM: ULTRASOUND ABDOMEN LIMITED RIGHT UPPER QUADRANT COMPARISON:  CT done earlier today FINDINGS: Gallbladder: There are multiple gallbladder stones largest measuring 2.5 cm. There is no significant wall thickening in gallbladder. Technologist did not observe any tenderness over the gallbladder. There is no fluid around the gallbladder. Common bile duct: Diameter: 6 mm Liver: No focal abnormalities are seen. There is increased echogenicity. Portal vein is patent on color Doppler imaging with normal direction of blood flow towards the liver. Other: None. IMPRESSION: Gallbladder stones. There are no signs of acute cholecystitis. There is no significant dilation of bile ducts. Electronically Signed   By: Elmer Picker M.D.   On: 03/25/2022 11:13   CT Renal Stone Study  Result Date: 03/25/2022 CLINICAL DATA:  Right flank pain with suspected kidney stone. Patient reports of having been treated for UTI for the past couple of weeks and not getting better. He woke up this a.m. with heart racing and pain in the right flank. EXAM: CT ABDOMEN AND PELVIS WITHOUT CONTRAST TECHNIQUE: Multidetector CT imaging of the abdomen and pelvis was performed following the standard protocol without IV contrast. RADIATION DOSE REDUCTION: This exam was performed according to the departmental dose-optimization program which includes automated exposure control, adjustment of the mA and/or kV according to patient size and/or use of iterative reconstruction technique. COMPARISON:  May 27, 2018 FINDINGS: Lower chest: No acute abnormality. Hepatobiliary:  Again seen are the multiple gallstones. There is some thickening of the gallbladder wall seen measuring 5.5 mm in greatest thickness (image 31/3). However, there is no pericholecystic stranding. No intrahepatic biliary dilatation. Pancreas: Unremarkable. No pancreatic ductal dilatation or surrounding inflammatory changes. Spleen: Normal in size without focal abnormality. Adrenals/Urinary Tract: Bilateral adrenals and left kidney are unremarkable. There has been interval removal of the right double-J ureter stent. There is 1.5 cm calculus seen in the right renal pelvis and is new. There is some thickening with stranding seen at the renal pelvis. Stomach/Bowel: Status post appendectomy. Small hiatal hernia. Bowel-gas pattern is nonobstructive. Mild scattered diverticulosis of the sigmoid colon without diverticulitis. Vascular/Lymphatic: No significant vascular findings are present. No enlarged abdominal  or pelvic lymph nodes. Reproductive: Uterus is unremarkable. 3 cm cyst at the right ovary and is new. 2.1 cm prominent follicle at the left ovary. Other: Small umbilical hernia with the hernial orifice measuring 2.4 cm containing fat. No free fluid in the abdomen or pelvis. There has been interval removal of the 3 drainage catheters at the abdomen and pelvis. Musculoskeletal: No acute or significant osseous findings. IMPRESSION: 1. There has been interval removal of the right double-J ureteral stent and resolution of the mild-to-moderate hydronephrosis. Presently, there is 1.5 cm calculus seen at the right renal pelvis and is new. There is some thickening and stranding seen at the right renal pelvis. Pyelitis cannot be excluded. Clinical and laboratory correlation is suggested. 2.  3 cm right ovarian simple-appearing cyst. No follow-up imaging is recommended. Reference: JACR 2020 Feb;17(2):248-254 3. Multiple gallstones with some thickening of the gallbladder wall. No pericholecystic stranding. 4. Status post  appendectomy. Small hiatal hernia. Bowel-gas pattern is nonobstructive. 5. Small umbilical hernia containing fat. Electronically Signed   By: Frazier Richards M.D.   On: 03/25/2022 09:55    Procedures Procedures    Medications Ordered in ED Medications  ketorolac (TORADOL) injection 60 mg (60 mg Intramuscular Patient Refused/Not Given 03/25/22 1037)  lactated ringers bolus 1,000 mL (0 mLs Intravenous Stopped 03/25/22 1115)    ED Course/ Medical Decision Making/ A&P                            Medical Decision Making Amount and/or Complexity of Data Reviewed Labs: ordered. Radiology: ordered.  Risk Prescription drug management.   This patient presents to the ED for concern of flank pain, palpitations, this involves a number of treatment options, and is a complaint that carries with it a high risk of complications and morbidity.  The differential diagnosis includes renal stones, musculoskeletal pain, UTI/pyelonephritis, PID, TOA, hyperthyroidism, arrhythmia, ACS, fibroids   Co morbidities: Discussed in HPI   Brief History:  46 year old female presenting with right-sided flank pain and palpitations.  Noted to have a heart rate of 140 this morning with no pain.  Has a history of ureteral stents and kidney stones.  Has been treated recently for UTI with nitrofurantoin and Bactrim.  Cultures reviewed and are negative.  EMR reviewed including pt PMHx, past surgical history and past visits to ER.   See HPI for more details   Lab Tests:  I ordered and independently interpreted labs.  The pertinent results include:    Labs notable for BMP without any significant electrode abnormalities, normal renal function CBC without leukocytosis UA is straw-colored, with small amount hemoglobin, ketonuria, moderate leukocytes and few bacteria.  Sent for culture. Hepatic function panel is normal Troponin is normal TSH and free T4 normal Lipase is normal  Imaging Studies:  Abnormal findings. I  personally reviewed all imaging studies. Imaging notable for     CT RENAL STONE STUDY  IMPRESSION:  1. There has been interval removal of the right double-J ureteral  stent and resolution of the mild-to-moderate hydronephrosis.  Presently, there is 1.5 cm calculus seen at the right renal pelvis  and is new. There is some thickening and stranding seen at the right  renal pelvis. Pyelitis cannot be excluded. Clinical and laboratory  correlation is suggested.    2.  3 cm right ovarian simple-appearing cyst.    No follow-up imaging is recommended. Reference: JACR 2020  Feb;17(2):248-254 3. Multiple gallstones with some thickening of  the  gallbladder wall. No pericholecystic stranding. 4. Status post  appendectomy. Small hiatal hernia. Bowel-gas pattern is  nonobstructive. 5. Small umbilical hernia containing fat.      RUQ US IMPRESSION:  Gallbladder stones. There are no signs of acute cholecystitis. There  is no significant dilation of bile ducts.    Cardiac Monitoring:  The patient was maintained on a cardiac monitor.  I personally viewed and interpreted the cardiac monitored which showed an underlying rhythm of: EKG non-ischemic   Medicines ordered:  I ordered medication including IV fluids for dehydration  Reevaluation of the patient after these medicines showed that the patient improved I have reviewed the patients home medicines and have made adjustments as needed   Critical Interventions: NA    Consults:  NA    Reevaluation:  After the interventions noted above I re-evaluated patient and found that they have :improved    Problem List / ED Course:  46 year old female presenting for palpitations and right flank pain.  Her lab work was overall reassuring, given history of renal stones I did obtain a CT renal study which showed a right 1.5 cm calculus in the renal pelvis.  No evidence of obstruction.  A 3 cm right ovarian simple appearing cyst was noted as well.   Pyelonephritis could not be excluded on CT imaging however given negative cultures, not overly infected urine today and recent course of 2 different antibiotics, I do not think she needs additional antibiotic adjustments.  Urine sent for culture.  She did not have significant CVA tenderness and my suspicion for pyelonephritis is low at this time.  She did have gallbladder wall thickening on CT imaging however her right upper quadrant ultrasound is negative for cholecystitis.  She does have evidence of gallstones and I recommended that if this becomes symptomatic she will need general surgery follow-up.  In terms of her palpitations, I did explain that there are myriad of symptoms that could be causing this including anxiety, dehydration.  No evidence of ACS, low suspicion for PE given no hypoxia, shortness of breath, and she has not been tachycardic here in the ER today.  I encouraged her to continue aggressive fluid resuscitation, offered IV fluids however she would prefer to drink water instead which I think is reasonable.  I offered her Toradol which she refused as she states that she is pain-free.  I encouraged her to follow-up with the PCP and that she may need further evaluation if her palpitations continue with cardiology.  We discussed return precautions.   Dispostion:  After consideration of the diagnostic results and the patients response to treatment, I feel that the patent would benefit from discharge with close PCP follow-up and strict return precautions.  Case discussed with Dr. Doren Custard who is agreeable to the above plan and disposition.   Final Clinical Impression(s) / ED Diagnoses Final diagnoses:  Right flank pain  Palpitations  Renal calculus, right  Gallstones  Cyst of right ovary  Umbilical hernia without obstruction and without gangrene    Rx / DC Orders ED Discharge Orders     None         Lyndel Safe 03/25/22 1320    Godfrey Pick, MD 03/25/22 Einar Crow

## 2022-03-26 LAB — URINE CULTURE: Culture: NO GROWTH

## 2022-03-28 ENCOUNTER — Ambulatory Visit: Payer: BC Managed Care – PPO | Admitting: Family

## 2022-04-30 DIAGNOSIS — Z01419 Encounter for gynecological examination (general) (routine) without abnormal findings: Secondary | ICD-10-CM | POA: Diagnosis not present

## 2022-04-30 DIAGNOSIS — N83209 Unspecified ovarian cyst, unspecified side: Secondary | ICD-10-CM | POA: Diagnosis not present

## 2022-06-05 DIAGNOSIS — N200 Calculus of kidney: Secondary | ICD-10-CM | POA: Diagnosis not present

## 2022-06-05 DIAGNOSIS — R310 Gross hematuria: Secondary | ICD-10-CM | POA: Diagnosis not present

## 2022-06-07 ENCOUNTER — Other Ambulatory Visit: Payer: Self-pay | Admitting: Urology

## 2022-06-28 DIAGNOSIS — N200 Calculus of kidney: Secondary | ICD-10-CM | POA: Diagnosis not present

## 2022-06-28 DIAGNOSIS — R310 Gross hematuria: Secondary | ICD-10-CM | POA: Diagnosis not present

## 2022-07-22 ENCOUNTER — Encounter (HOSPITAL_BASED_OUTPATIENT_CLINIC_OR_DEPARTMENT_OTHER): Payer: Self-pay | Admitting: Urology

## 2022-07-22 NOTE — Progress Notes (Signed)
Talked with patient. Arrival time 0800. NPO after MN. Instructions given. Hx and meds reviewed. Driver secured. To bring blue folder thursday

## 2022-07-25 ENCOUNTER — Encounter (HOSPITAL_BASED_OUTPATIENT_CLINIC_OR_DEPARTMENT_OTHER): Payer: Self-pay | Admitting: Urology

## 2022-07-25 ENCOUNTER — Ambulatory Visit (HOSPITAL_COMMUNITY): Payer: BC Managed Care – PPO

## 2022-07-25 ENCOUNTER — Encounter (HOSPITAL_BASED_OUTPATIENT_CLINIC_OR_DEPARTMENT_OTHER)
Admission: RE | Disposition: A | Discharge status: Home / Self Care | Payer: Self-pay | Source: Home / Self Care | Attending: Urology

## 2022-07-25 ENCOUNTER — Other Ambulatory Visit: Payer: Self-pay

## 2022-07-25 ENCOUNTER — Ambulatory Visit (HOSPITAL_BASED_OUTPATIENT_CLINIC_OR_DEPARTMENT_OTHER)
Admission: RE | Admit: 2022-07-25 | Discharge: 2022-07-25 | Disposition: A | Discharge status: Home / Self Care | Payer: BC Managed Care – PPO | Attending: Urology | Admitting: Urology

## 2022-07-25 DIAGNOSIS — N200 Calculus of kidney: Secondary | ICD-10-CM | POA: Diagnosis not present

## 2022-07-25 DIAGNOSIS — R310 Gross hematuria: Secondary | ICD-10-CM | POA: Insufficient documentation

## 2022-07-25 DIAGNOSIS — Z01818 Encounter for other preprocedural examination: Secondary | ICD-10-CM

## 2022-07-25 HISTORY — PX: EXTRACORPOREAL SHOCK WAVE LITHOTRIPSY: SHX1557

## 2022-07-25 HISTORY — DX: Anxiety disorder, unspecified: F41.9

## 2022-07-25 LAB — POCT PREGNANCY, URINE: Preg Test, Ur: NEGATIVE

## 2022-07-25 SURGERY — LITHOTRIPSY, ESWL
Anesthesia: LOCAL | Laterality: Right

## 2022-07-25 MED ORDER — CIPROFLOXACIN HCL 500 MG PO TABS
ORAL_TABLET | ORAL | Status: AC
  Filled 2022-07-25: qty 1

## 2022-07-25 MED ORDER — SODIUM CHLORIDE 0.9 % IV SOLN: INTRAVENOUS | Status: DC

## 2022-07-25 MED ORDER — OXYCODONE HCL 5 MG PO TABS: 5.0000 mg | ORAL_TABLET | Freq: Three times a day (TID) | ORAL | 0 refills | Status: DC | PRN

## 2022-07-25 MED ORDER — OXYCODONE HCL 5 MG PO TABS
5.0000 mg | ORAL_TABLET | Freq: Once | ORAL | Status: AC
  Administered 2022-07-25: 5 mg via ORAL

## 2022-07-25 MED ORDER — DIAZEPAM 5 MG PO TABS
ORAL_TABLET | ORAL | Status: AC
  Filled 2022-07-25: qty 2

## 2022-07-25 MED ORDER — DIPHENHYDRAMINE HCL 25 MG PO CAPS
25.0000 mg | ORAL_CAPSULE | ORAL | Status: AC
  Administered 2022-07-25: 25 mg via ORAL

## 2022-07-25 MED ORDER — CIPROFLOXACIN HCL 500 MG PO TABS
500.0000 mg | ORAL_TABLET | ORAL | Status: AC
  Administered 2022-07-25: 500 mg via ORAL

## 2022-07-25 MED ORDER — DIAZEPAM 5 MG PO TABS
10.0000 mg | ORAL_TABLET | ORAL | Status: AC
  Administered 2022-07-25: 10 mg via ORAL

## 2022-07-25 MED ORDER — OXYCODONE HCL 5 MG PO TABS
ORAL_TABLET | ORAL | Status: AC
  Filled 2022-07-25: qty 1

## 2022-07-25 MED ORDER — DIPHENHYDRAMINE HCL 25 MG PO CAPS
ORAL_CAPSULE | ORAL | Status: AC
  Filled 2022-07-25: qty 1

## 2022-07-25 MED ORDER — TAMSULOSIN HCL 0.4 MG PO CAPS: 0.4000 mg | ORAL_CAPSULE | Freq: Every day | ORAL | 0 refills | Status: DC

## 2022-07-25 NOTE — Discharge Instructions (Addendum)
See [facility] discharge instructions in chart.   Also, please tell patient not to drink or eat anything in the morning before the appointment on 12/8 in the event additional intervention is needed.    Post Anesthesia Home Care Instructions  Activity: Get plenty of rest for the remainder of the day. A responsible individual must stay with you for 24 hours following the procedure.  For the next 24 hours, DO NOT: -Drive a car -Paediatric nurse -Drink alcoholic beverages -Take any medication unless instructed by your physician -Make any legal decisions or sign important papers.  Meals: Start with liquid foods such as gelatin or soup. Progress to regular foods as tolerated. Avoid greasy, spicy, heavy foods. If nausea and/or vomiting occur, drink only clear liquids until the nausea and/or vomiting subsides. Call your physician if vomiting continues.  Special Instructions/Symptoms: Your throat may feel dry or sore from the anesthesia or the breathing tube placed in your throat during surgery. If this causes discomfort, gargle with warm salt water. The discomfort should disappear within 24 hours.  If you had a scopolamine patch placed behind your ear for the management of post- operative nausea and/or vomiting:  1. The medication in the patch is effective for 72 hours, after which it should be removed.  Wrap patch in a tissue and discard in the trash. Wash hands thoroughly with soap and water. 2. You may remove the patch earlier than 72 hours if you experience unpleasant side effects which may include dry mouth, dizziness or visual disturbances. 3. Avoid touching the patch. Wash your hands with soap and water after contact with the patch.

## 2022-07-25 NOTE — H&P (Signed)
See HP scanned into Epic

## 2022-07-25 NOTE — Op Note (Signed)
See [facility] OP note scanned into chart. Also because of the size, density, location and other factors that cannot be anticipated I feel this will likely be a staged procedure. This fact supersedes any indication in the scanned [facility] operative note to the contrary.  

## 2022-07-26 ENCOUNTER — Encounter (HOSPITAL_BASED_OUTPATIENT_CLINIC_OR_DEPARTMENT_OTHER): Payer: Self-pay | Admitting: Urology

## 2022-07-26 DIAGNOSIS — N200 Calculus of kidney: Secondary | ICD-10-CM | POA: Diagnosis not present

## 2022-08-09 DIAGNOSIS — N200 Calculus of kidney: Secondary | ICD-10-CM | POA: Diagnosis not present

## 2022-08-20 DIAGNOSIS — N200 Calculus of kidney: Secondary | ICD-10-CM | POA: Diagnosis not present

## 2022-09-13 DIAGNOSIS — R109 Unspecified abdominal pain: Secondary | ICD-10-CM | POA: Diagnosis not present

## 2022-09-13 DIAGNOSIS — N200 Calculus of kidney: Secondary | ICD-10-CM | POA: Diagnosis not present

## 2022-10-10 DIAGNOSIS — N644 Mastodynia: Secondary | ICD-10-CM | POA: Diagnosis not present

## 2022-10-10 DIAGNOSIS — Z780 Asymptomatic menopausal state: Secondary | ICD-10-CM | POA: Diagnosis not present

## 2022-10-10 DIAGNOSIS — R5383 Other fatigue: Secondary | ICD-10-CM | POA: Diagnosis not present

## 2022-10-10 DIAGNOSIS — R6889 Other general symptoms and signs: Secondary | ICD-10-CM | POA: Diagnosis not present

## 2022-10-20 DIAGNOSIS — N200 Calculus of kidney: Secondary | ICD-10-CM | POA: Diagnosis not present

## 2022-10-21 ENCOUNTER — Encounter: Payer: Self-pay | Admitting: Obstetrics and Gynecology

## 2022-10-23 ENCOUNTER — Encounter: Payer: Self-pay | Admitting: Obstetrics and Gynecology

## 2022-11-08 DIAGNOSIS — N200 Calculus of kidney: Secondary | ICD-10-CM | POA: Diagnosis not present

## 2022-11-09 ENCOUNTER — Other Ambulatory Visit: Payer: Self-pay

## 2022-11-09 ENCOUNTER — Emergency Department (HOSPITAL_COMMUNITY)
Admission: EM | Admit: 2022-11-09 | Discharge: 2022-11-09 | Disposition: A | Discharge status: Home / Self Care | Payer: BC Managed Care – PPO | Attending: Emergency Medicine | Admitting: Emergency Medicine

## 2022-11-09 ENCOUNTER — Encounter (HOSPITAL_COMMUNITY): Payer: Self-pay | Admitting: *Deleted

## 2022-11-09 DIAGNOSIS — K921 Melena: Secondary | ICD-10-CM | POA: Insufficient documentation

## 2022-11-09 DIAGNOSIS — R195 Other fecal abnormalities: Secondary | ICD-10-CM

## 2022-11-09 DIAGNOSIS — R1033 Periumbilical pain: Secondary | ICD-10-CM | POA: Diagnosis not present

## 2022-11-09 LAB — CBC WITH DIFFERENTIAL/PLATELET
Abs Immature Granulocytes: 0.01 10*3/uL (ref 0.00–0.07)
Basophils Absolute: 0 10*3/uL (ref 0.0–0.1)
Basophils Relative: 1 %
Eosinophils Absolute: 0 10*3/uL (ref 0.0–0.5)
Eosinophils Relative: 0 %
HCT: 40.3 % (ref 36.0–46.0)
Hemoglobin: 13.2 g/dL (ref 12.0–15.0)
Immature Granulocytes: 0 %
Lymphocytes Relative: 18 %
Lymphs Abs: 1.3 10*3/uL (ref 0.7–4.0)
MCH: 28.5 pg (ref 26.0–34.0)
MCHC: 32.8 g/dL (ref 30.0–36.0)
MCV: 87 fL (ref 80.0–100.0)
Monocytes Absolute: 0.5 10*3/uL (ref 0.1–1.0)
Monocytes Relative: 7 %
Neutro Abs: 5.3 10*3/uL (ref 1.7–7.7)
Neutrophils Relative %: 74 %
Platelets: 256 10*3/uL (ref 150–400)
RBC: 4.63 MIL/uL (ref 3.87–5.11)
RDW: 13.5 % (ref 11.5–15.5)
WBC: 7.1 10*3/uL (ref 4.0–10.5)
nRBC: 0 % (ref 0.0–0.2)

## 2022-11-09 LAB — BASIC METABOLIC PANEL
Anion gap: 7 (ref 5–15)
BUN: 8 mg/dL (ref 6–20)
CO2: 27 mmol/L (ref 22–32)
Calcium: 8.7 mg/dL — ABNORMAL LOW (ref 8.9–10.3)
Chloride: 104 mmol/L (ref 98–111)
Creatinine, Ser: 0.58 mg/dL (ref 0.44–1.00)
GFR, Estimated: >60 mL/min (ref >60)
Glucose, Bld: 86 mg/dL (ref 70–99)
Potassium: 4 mmol/L (ref 3.5–5.1)
Sodium: 138 mmol/L (ref 135–145)

## 2022-11-09 LAB — I-STAT BETA HCG BLOOD, ED (MC, WL, AP ONLY): I-stat hCG, quantitative: <5 m[IU]/mL (ref <5)

## 2022-11-09 LAB — POC OCCULT BLOOD, ED: Fecal Occult Bld: NEGATIVE

## 2022-11-09 NOTE — Discharge Instructions (Addendum)

## 2022-11-09 NOTE — ED Triage Notes (Signed)
C/o 2 weeks ago c/o not feeling well c/o pressure in her rectum c/o nausea , c/o difficultly passing gas c/o stool being dark this am states that is her was her last bm. States she just don't feel right

## 2022-11-09 NOTE — ED Provider Notes (Signed)
[facility] Provider Note   CSN: [id number] Arrival date & time: 11/09/22  1039     History  Chief Complaint  Patient presents with   Back Pain    Erica Ross is a 47 y.o. female who presents urgency department with chief complaint of dark stool.  Patient states that for about 2 weeks she has been feeling pressure in her belly and occasional pain in her stomach.  She states that she has tried doing an anti-inflammatory diet and is eating more healthy foods.  Today she noticed that her stool was dark and she was concerned she might have some bleeding.  She also has a friend who was diagnosed with stage III colon cancer at the age of 34 which she feels is extremely concerning.  She denies any nausea, vomiting, diarrhea, history of inflammatory bowel disease.  Patient denies use of iron or bismuth containing medications   Back Pain      Home Medications Prior to Admission medications   Medication Sig Start Date End Date Taking? Authorizing Provider  acetaminophen (TYLENOL) 500 MG tablet Take 500-1,000 mg by mouth daily as needed for moderate pain.    [provider]  magnesium gluconate (MAGONATE) 500 MG tablet Take 500 mg by mouth 2 (two) times daily.    [provider]  Multiple Vitamin (MULTIVITAMIN ADULT PO) Take by mouth.    [provider]  Multiple Vitamins-Minerals (VITAMIN D3 COMPLETE PO) Take by mouth daily at 12 noon.    [provider]  Omega-3 Fatty Acids (FISH OIL ODOR-LESS PO) Take 1,500 mg by mouth daily at 12 noon.    [provider]  ondansetron (ZOFRAN) 4 MG tablet Take 1 tablet (4 mg total) by mouth every 8 (eight) hours as needed for nausea or vomiting. 03/22/22   Evelina Dun A, FNP  oxyCODONE (ROXICODONE) 5 MG immediate release tablet Take 1 tablet (5 mg total) by mouth every 8 (eight) hours as needed. 07/25/22 07/25/23  Robley Fries, MD  Probiotic Product (PROBIOTIC  DAILY PO) Take by mouth.    [provider]  tamsulosin (FLOMAX) 0.4 MG CAPS capsule Take 1 capsule (0.4 mg total) by mouth at bedtime. 07/25/22   Robley Fries, MD      Allergies    Watermelon [citrullus vulgaris] and Amoxicillin    Review of Systems   Review of Systems  Musculoskeletal:  Positive for back pain.    Physical Exam Updated Vital Signs BP 130/88   Pulse 94   Temp 98.4 F (36.9 C) (Oral)   Resp 18   Ht 5\' 4"  (1.626 m)   Wt 60.8 kg   SpO2 100%   BMI 23.00 kg/m  Physical Exam Vitals and nursing note reviewed.  Constitutional:      General: She is not in acute distress.    Appearance: She is well-developed. She is not diaphoretic.  HENT:     Head: Normocephalic and atraumatic.     Right Ear: External ear normal.     Left Ear: External ear normal.     Nose: Nose normal.     Mouth/Throat:     Mouth: Mucous membranes are moist.  Eyes:     General: No scleral icterus.    Conjunctiva/sclera: Conjunctivae normal.  Cardiovascular:     Rate and Rhythm: Normal rate and regular rhythm.     Heart sounds: Normal heart sounds. No murmur heard.    No friction rub.  No gallop.  Pulmonary:     Effort: Pulmonary effort is normal. No respiratory distress.     Breath sounds: Normal breath sounds.  Abdominal:     General: Bowel sounds are normal. There is no distension.     Palpations: Abdomen is soft. There is no mass.     Tenderness: There is no abdominal tenderness. There is no guarding.  Genitourinary:    Comments: Digital Rectal Exam reveals sphincter with good tone. No external hemorrhoids. No masses or fissures. Stool color is brown with no overt blood.  Musculoskeletal:     Cervical back: Normal range of motion.  Skin:    General: Skin is warm and dry.  Neurological:     Mental Status: She is alert and oriented to person, place, and time.  Psychiatric:        Behavior: Behavior normal.     ED Results / Procedures / Treatments   Labs (all labs  ordered are listed, but only abnormal results are displayed) Labs Reviewed  BASIC METABOLIC PANEL - Abnormal; Notable for the following components:      Result Value   Calcium 8.7 (*)    All other components within normal limits  CBC WITH DIFFERENTIAL/PLATELET  POC OCCULT BLOOD, ED  I-STAT BETA HCG BLOOD, ED ([facility], [facility], [facility] ONLY)    EKG None  Radiology No results found.  Procedures Procedures    Medications Ordered in ED Medications - No data to display  ED Course/ Medical Decision Making/ A&P Clinical Course as of 11/09/22 1333  Sat Nov 09, 2022  1230 POC occult blood, ED [AH]  1244 CBC with Differential [AH]  1244 I-Stat beta hCG blood, ED [AH]    Clinical Course User Index [AH] Margarita Mail, PA-C                             Medical Decision Making Patient here with complaint of dark stool.  Patient primarily is concerned she could have a cancer as she has friend in her 34s who developed colorectal cancer stage III after having some rectal bleeding.  Differential diagnosis includes upper GI bleed, dark stool secondary to food discoloration or medications.  Doubt inflammatory bowel disorder.  She has no bright red blood per rectum suggestive of lower GI bleed or very brisk upper GI bleed.  She is hemodynamically stable.  On examination patient has a very benign abdominal exam without any tenderness to palpation, no pain in the rectum on physical examination and only a small amount of dark brown stool on examining glove.  I ordered labs and reviewed them. She has a negative pregnancy test, CBC within normal limits, BMP with mildly low calcium of insignificant value.  Her occult stool test is negative.  I discussed all findings with the patient.  I reviewed labs.  I do not think she needs any imaging at this time.  Patient is almost 37 states that she has not had a colonoscopy.  We discussed new guidelines for colonoscopy where turnout 16.  Will give the patient referral  to gastroenterology for further evaluation.  She appears otherwise appropriate for discharge.  Return precautions discussed  Amount and/or Complexity of Data Reviewed Labs: ordered. Decision-making details documented in ED Course.           Final Clinical Impression(s) / ED Diagnoses Final diagnoses:  Dark stools  Periumbilical abdominal pain    Rx / DC Orders ED Discharge Orders  None         Margarita Mail, PA-C 11/09/22 1521    Malvin Johns, MD 11/09/22 1536

## 2022-11-13 ENCOUNTER — Encounter: Payer: Self-pay | Admitting: Obstetrics and Gynecology

## 2022-11-22 ENCOUNTER — Other Ambulatory Visit: Payer: Self-pay | Admitting: Obstetrics and Gynecology

## 2022-11-22 DIAGNOSIS — N644 Mastodynia: Secondary | ICD-10-CM

## 2022-11-26 ENCOUNTER — Encounter: Payer: Self-pay | Admitting: Nurse Practitioner

## 2022-11-26 ENCOUNTER — Ambulatory Visit: Payer: BC Managed Care – PPO | Admitting: Nurse Practitioner

## 2022-11-26 VITALS — BP 121/80 | HR 72 | Temp 97.8°F | Resp 20 | Ht 64.0 in | Wt 133.0 lb

## 2022-11-26 DIAGNOSIS — R195 Other fecal abnormalities: Secondary | ICD-10-CM | POA: Diagnosis not present

## 2022-11-26 NOTE — Progress Notes (Signed)
   Subjective:    Patient ID: Erica Ross, female    DOB: May 24, 1976, 47 y.o.   MRN: [id number]   Chief Complaint: dark stools  HPI  There are no problems to display for this patient.  Patient went to the ED on 11/09/22 with dark stools. Had started 2 weeks prior to ED visit. She had some pressure and occasional pain in her belly during that time. Concerned about colon cancer. Would like referral to GI for a work up.     Review of Systems  Constitutional:  Negative for diaphoresis, fatigue and fever.  Eyes:  Negative for pain.  Respiratory:  Negative for shortness of breath.   Cardiovascular:  Negative for chest pain, palpitations and leg swelling.  Gastrointestinal:  Negative for abdominal pain, constipation, diarrhea, nausea, rectal pain and vomiting.  Endocrine: Negative for polydipsia.  Skin:  Negative for rash.  Neurological:  Negative for dizziness, weakness and headaches.  Hematological:  Does not bruise/bleed easily.  All other systems reviewed and are negative.      Objective:   Physical Exam Vitals reviewed.  Constitutional:      Appearance: Normal appearance.  Cardiovascular:     Rate and Rhythm: Normal rate and regular rhythm.     Heart sounds: Normal heart sounds.  Pulmonary:     Effort: Pulmonary effort is normal.     Breath sounds: Normal breath sounds.  Abdominal:     General: Abdomen is flat.     Palpations: Abdomen is soft.     Tenderness: There is abdominal tenderness. There is no right CVA tenderness, left CVA tenderness or guarding.  Skin:    General: Skin is warm.  Neurological:     General: No focal deficit present.     Mental Status: She is alert and oriented to person, place, and time.  Psychiatric:        Mood and Affect: Mood normal.        Behavior: Behavior normal.     BP 121/80   Pulse 72   Temp 97.8 F (36.6 C) (Temporal)   Resp 20   Ht 5\' 4"  (1.626 m)   Wt 133 lb (60.3 kg)   SpO2 100%   BMI 22.83 kg/m         Assessment & Plan:   Erica Ross in today with chief complaint of Discuss colonoscopy and hormones   1. Dark stools Referral made Patient is doing a detox and was told ok to continue - Ambulatory referral to Gastroenterology    The above assessment and management plan was discussed with the patient. The patient verbalized understanding of and has agreed to the management plan. Patient is aware to call the clinic if symptoms persist or worsen. Patient is aware when to return to the clinic for a follow-up visit. Patient educated on when it is appropriate to go to the emergency department.   Mary-Margaret Daphine Deutscher, FNP

## 2023-01-21 DIAGNOSIS — Z780 Asymptomatic menopausal state: Secondary | ICD-10-CM | POA: Diagnosis not present

## 2023-01-21 DIAGNOSIS — R102 Pelvic and perineal pain: Secondary | ICD-10-CM | POA: Diagnosis not present

## 2023-01-21 DIAGNOSIS — N8312 Corpus luteum cyst of left ovary: Secondary | ICD-10-CM | POA: Diagnosis not present

## 2023-02-24 NOTE — Progress Notes (Unsigned)
02/25/2023 Erica Ross [id number] 1976/03/20  Referring provider: Daphine Deutscher, Mary-Margaret, * Primary GI doctor: Dr. Barron Alvine  ASSESSMENT AND PLAN:   Dark stools Normal CBC, negative FOBT in ER No upper GI symptoms Resolved when patient stopped supplements  Special screening for malignant neoplasms, colon No family history of colon cancer, no real change in bowel habits.  No hematochezia. Patient also has Cologuard at home but is interested in proceeding with colonoscopy. Discussed pros and cons of Cologuard versus colonoscopy and she would like to proceed. We have discussed the risks of bleeding, infection, perforation, medication reactions, and remote risk of death associated with colonoscopy. All questions were answered and the patient acknowledges these risk and wishes to proceed.    Patient Care Team: Bennie Pierini, FNP as PCP - General (Nurse Practitioner)  HISTORY OF PRESENT ILLNESS: 47 y.o. female with no significant past medical history presents for evaluation of dark stools and colon screening.   Patient denies family history of colon cancer or other gastrointestinal malignancies. She states she has ovarian cyst and having some hormonal issues, following with Dr. Ellyn Hack.   11/09/2022 ER visit due to dark stools, 2 weeks of abdominal pressure/pain.  Has increased healthy foods doing anti-inflammatory diet. In the ER CBC without anemia, leukocytosis, kidney function unremarkable no elevation of BUN, negative fecal occult blood.  She is not on any medications but takes a lot of supplements and prefer to do natural issues.  She was having dark stools but she was having a lot of supplements and thought it was from that.  She has no UGI symptoms, no gerd, dysphagia.  She has had history of gallstones, has occ nausea and RUQ pain. 03/2022 2.5 cm GB stones.  Has family history of gallbladder issues.  She has history hemorrhoids from her 3 kids, no blood in stool  but can have thin stools.  No change in bowel habits, no diarrhea.   She denies blood thinner use.  She denies NSAID use.  She denies ETOH use.   She denies tobacco use.  She denies drug use.    She  reports that she has never smoked. She has never used smokeless tobacco. She reports that she does not drink alcohol and does not use drugs.  RELEVANT LABS AND IMAGING: CBC    Component Value Date/Time   WBC 7.1 11/09/2022 1210   RBC 4.63 11/09/2022 1210   HGB 13.2 11/09/2022 1210   HGB 13.0 07/24/2020 1553   HCT 40.3 11/09/2022 1210   HCT 39.7 07/24/2020 1553   PLT 256 11/09/2022 1210   PLT 277 07/24/2020 1553   MCV 87.0 11/09/2022 1210   MCV 89 07/24/2020 1553   MCH 28.5 11/09/2022 1210   MCHC 32.8 11/09/2022 1210   RDW 13.5 11/09/2022 1210   RDW 12.1 07/24/2020 1553   LYMPHSABS 1.3 11/09/2022 1210   LYMPHSABS 3.3 (H) 07/24/2020 1553   MONOABS 0.5 11/09/2022 1210   EOSABS 0.0 11/09/2022 1210   EOSABS 0.2 07/24/2020 1553   BASOSABS 0.0 11/09/2022 1210   BASOSABS 0.1 07/24/2020 1553   Recent Labs    03/25/22 0542 11/09/22 1210  HGB 13.6 13.2    CMP     Component Value Date/Time   NA 138 11/09/2022 1210   NA 137 07/24/2020 1553   K 4.0 11/09/2022 1210   CL 104 11/09/2022 1210   CO2 27 11/09/2022 1210   GLUCOSE 86 11/09/2022 1210   BUN 8 11/09/2022 1210   BUN 13  07/24/2020 1553   CREATININE 0.58 11/09/2022 1210   CALCIUM 8.7 (L) 11/09/2022 1210   PROT 7.0 03/25/2022 1111   PROT 7.4 07/24/2020 1553   ALBUMIN 3.6 03/25/2022 1111   ALBUMIN 4.4 07/24/2020 1553   AST 19 03/25/2022 1111   ALT 18 03/25/2022 1111   ALKPHOS 59 03/25/2022 1111   BILITOT 0.5 03/25/2022 1111   BILITOT <0.2 07/24/2020 1553   GFRNONAA >60 11/09/2022 1210   GFRAA 127 07/24/2020 1553      Latest Ref Rng & Units 03/25/2022   11:11 AM 07/24/2020    3:53 PM 03/23/2019    9:49 AM  Hepatic Function  Total Protein 6.5 - 8.1 g/dL 7.0  7.4  6.8   Albumin 3.5 - 5.0 g/dL 3.6  4.4  4.2   AST 15  - 41 U/L 19  16  19    ALT 0 - 44 U/L 18  12  13    Alk Phosphatase 38 - 126 U/L 59  87  59   Total Bilirubin 0.3 - 1.2 mg/dL 0.5  <4.0  0.3   Bilirubin, Direct 0.0 - 0.2 mg/dL <9.8         Current Medications:        Current Outpatient Medications (Other):    magnesium gluconate (MAGONATE) 500 MG tablet, Take 500 mg by mouth 2 (two) times daily.   Multiple Vitamins-Minerals (VITAMIN D3 COMPLETE PO), Take by mouth daily at 12 noon.   Na Sulfate-K Sulfate-Mg Sulf 17.5-3.13-1.6 GM/177ML SOLN, Take 1 kit by mouth once for 1 dose.   Omega-3 Fatty Acids (FISH OIL ODOR-LESS PO), Take 1,500 mg by mouth daily at 12 noon.   Probiotic Product (PROBIOTIC DAILY PO), Take by mouth.  Medical History:  Past Medical History:  Diagnosis Date   Adnexal mass 03/02/2018   Right 5.1cm   Anxiety    Gallstones    GERD (gastroesophageal reflux disease)    occ    Hay fever    Hernia, umbilical    Kidney stones    Left breast mass    Renal calculus, right    UTI (urinary tract infection)    Allergies:  Allergies  Allergen Reactions   Watermelon [Citrullus Vulgaris] Itching    Throat itches.   Amoxicillin Rash    Has patient had a PCN reaction causing immediate rash, facial/tongue/throat swelling, SOB or lightheadedness with hypotension: No Has patient had a PCN reaction causing severe rash involving mucus membranes or skin necrosis: No Has patient had a PCN reaction that required hospitalization: No Has patient had a PCN reaction occurring within the last 10 years: Yes--very mild rash If all of the above answers are "NO", then may proceed with Cephalosporin use.      Surgical History:  She  has a past surgical history that includes Laparoscopic removal abdominal mass (N/A, 04/29/2018); Cystoscopy with stent placement (Bilateral, 04/29/2018); IR Radiologist Eval & Mgmt (05/27/2018); and Extracorporeal shock wave lithotripsy (Right, 07/25/2022). Family History:  Her family history includes  Aneurysm in her father; Breast cancer in her paternal grandmother and another family member; Diabetes in her brother and mother.  REVIEW OF SYSTEMS  : All other systems reviewed and negative except where noted in the History of Present Illness.  PHYSICAL EXAM: BP 120/80   Pulse 70   Ht 5\' 3"  (1.6 m)   Wt 134 lb (60.8 kg)   BMI 23.74 kg/m  General Appearance: Well nourished, in no apparent distress. Head:   Normocephalic and atraumatic. Eyes:  sclerae anicteric,conjunctive pink  Respiratory: Respiratory effort normal, BS equal bilaterally without rales, rhonchi, wheezing. Cardio: RRR with no MRGs. Peripheral pulses intact.  Abdomen: Soft,  Flat ,active bowel sounds. No tenderness . Marland Kitchen No masses. Rectal: Not evaluated Musculoskeletal: Full ROM, Normal gait. Without edema. Skin:  Dry and intact without significant lesions or rashes Neuro: Alert and  oriented x4;  No focal deficits. Psych:  Cooperative. Normal mood and affect.    Doree Albee, PA-C 12:57 PM

## 2023-02-25 ENCOUNTER — Ambulatory Visit (INDEPENDENT_AMBULATORY_CARE_PROVIDER_SITE_OTHER): Payer: BC Managed Care – PPO | Admitting: Physician Assistant

## 2023-02-25 ENCOUNTER — Encounter: Payer: Self-pay | Admitting: Physician Assistant

## 2023-02-25 VITALS — BP 120/80 | HR 70 | Ht 63.0 in | Wt 134.0 lb

## 2023-02-25 DIAGNOSIS — R195 Other fecal abnormalities: Secondary | ICD-10-CM

## 2023-02-25 DIAGNOSIS — R350 Frequency of micturition: Secondary | ICD-10-CM

## 2023-02-25 DIAGNOSIS — Z1211 Encounter for screening for malignant neoplasm of colon: Secondary | ICD-10-CM

## 2023-02-25 MED ORDER — NA SULFATE-K SULFATE-MG SULF 17.5-3.13-1.6 GM/177ML PO SOLN: 1.0000 | Freq: Once | ORAL | 0 refills | Status: AC

## 2023-02-25 NOTE — Patient Instructions (Addendum)
You have been scheduled for a colonoscopy. Please follow written instructions given to you at your visit today.   Please pick up your prep supplies at the pharmacy within the next 1-3 days.  If you use inhalers (even only as needed), please bring them with you on the day of your procedure.  DO NOT TAKE 7 DAYS PRIOR TO TEST- Trulicity (dulaglutide) Ozempic, Wegovy (semaglutide) Mounjaro (tirzepatide) Bydureon Bcise (exanatide extended release)  DO NOT TAKE 1 DAY PRIOR TO YOUR TEST Rybelsus (semaglutide) Adlyxin (lixisenatide) Victoza (liraglutide) Byetta (exanatide) ___________________________________________________________________________   Here some information about pelvic floor dysfunction. We could also refer to pelvic floor physical therapy.   Pelvic Floor Dysfunction, Female Pelvic floor dysfunction (PFD) is a condition that results when the group of muscles and connective tissues that support the organs in the pelvis (pelvic floor muscles) do not work well. These muscles and their connections form a sling that supports the colon and bladder. In women, they also support the uterus. PFD causes pelvic floor muscles to be too weak, too tight, or both. In PFD, muscle movements are not coordinated. This may cause bowel or bladder problems. It may also cause pain. What are the causes? This condition may be caused by an injury to the pelvic area or by a weakening of pelvic muscles. This often results from pregnancy and childbirth or other types of strain. In many cases, the exact cause is not known. What increases the risk? The following factors may make you more likely to develop this condition: Having chronic bladder tissue inflammation (interstitial cystitis). Being an older person. Being overweight. History of radiation treatment for cancer in the pelvic region. Previous pelvic surgery, such as removal of the uterus (hysterectomy). What are the signs or symptoms? Symptoms of  this condition vary and may include: Bladder symptoms, such as: Trouble starting urination and emptying the bladder. Frequent urinary tract infections. Leaking urine when coughing, laughing, or exercising (stress incontinence). Having to pass urine urgently or frequently. Pain when passing urine. Bowel symptoms, such as: Constipation. Urgent or frequent bowel movements. Incomplete bowel movements. Painful bowel movements. Leaking stool or gas. Unexplained genital or rectal pain. Genital or rectal muscle spasms. Low back pain. Other symptoms may include: A heavy, full, or aching feeling in the vagina. A bulge that protrudes into the vagina. Pain during or after sex. How is this diagnosed? This condition may be diagnosed based on: Your symptoms and medical history. A physical exam. During the exam, your health care provider may check your pelvic muscles for tightness, spasm, pain, or weakness. This may include a rectal exam and a pelvic exam. In some cases, you may have diagnostic tests, such as: Electrical muscle function tests. Urine flow testing. X-ray tests of bowel function. Ultrasound of the pelvic organs. How is this treated? Treatment for this condition depends on the symptoms. Treatment options include: Physical therapy. This may include Kegel exercises to help relax or strengthen the pelvic floor muscles. Biofeedback. This type of therapy provides feedback on how tight your pelvic floor muscles are so that you can learn to control them. Internal or external massage therapy. A treatment that involves electrical stimulation of the pelvic floor muscles to help control pain (transcutaneous electrical nerve stimulation, or TENS). Sound wave therapy (ultrasound) to reduce muscle spasms. Medicines, such as: Muscle relaxants. Bladder control medicines. Surgery to reconstruct or support pelvic floor muscles may be an option if other treatments do not help. Follow these  instructions at home: Activity Do your  usual activities as told by your health care provider. Ask your health care provider if you should modify any activities. Do pelvic floor strengthening or relaxing exercises at home as told by your physical therapist. Lifestyle Maintain a healthy weight. Eat foods that are high in fiber, such as beans, whole grains, and fresh fruits and vegetables. Limit foods that are high in fat and processed sugars, such as fried or sweet foods. Manage stress with relaxation techniques such as yoga or meditation. General instructions If you have problems with leakage: Use absorbable pads or wear padded underwear. Wash frequently with mild soap. Keep your genital and anal area as clean and dry as possible. Ask your health care provider if you should try a barrier cream to prevent skin irritation. Take warm baths to relieve pelvic muscle tension or spasms. Take over-the-counter and prescription medicines only as told by your health care provider. Keep all follow-up visits. How is this prevented? The cause of PFD is not always known, but there are a few things you can do to reduce the risk of developing this condition, including: Staying at a healthy weight. Getting regular exercise. Managing stress. Contact a health care provider if: Your symptoms are not improving with home care. You have signs or symptoms of PFD that get worse at home. You develop new signs or symptoms. You have signs of a urinary tract infection, such as: Fever. Chills. Increased urinary frequency. A burning feeling when urinating. You have not had a bowel movement in 3 days (constipation). Summary Pelvic floor dysfunction results when the muscles and connective tissues in your pelvic floor do not work well. These muscles and their connections form a sling that supports your colon and bladder. In women, they also support the uterus. PFD may be caused by an injury to the pelvic area or by  a weakening of pelvic muscles. PFD causes pelvic floor muscles to be too weak, too tight, or a combination of both. Symptoms may vary from person to person. In most cases, PFD can be treated with physical therapies and medicines. Surgery may be an option if other treatments do not help. This information is not intended to replace advice given to you by your health care provider. Make sure you discuss any questions you have with your health care provider. Document Revised: 12/13/2020 Document Reviewed: 12/13/2020 Elsevier Patient Education  2022 [employer].

## 2023-03-27 NOTE — Progress Notes (Signed)
Agree with the assessment and plan as outlined by Amanda Collier, PA-C. ? ? , DO, FACG ? ?

## 2023-04-11 ENCOUNTER — Encounter: Payer: Self-pay | Admitting: Gastroenterology

## 2023-04-14 ENCOUNTER — Telehealth: Payer: Self-pay | Admitting: Gastroenterology

## 2023-04-14 ENCOUNTER — Telehealth: Payer: Self-pay | Admitting: Physician Assistant

## 2023-04-14 NOTE — Telephone Encounter (Signed)
Pt is requesting a call back to discuss the coding of her colonoscopy. The pt states the reason for her visit on 7/9 was dark stools but it was resolved so the PA ordered a colonoscopy for her since she is eligible due to age. Pt would like to know if her colonoscopy could be coded as screening so she doesn't have to pay for the procedure herself.

## 2023-04-14 NOTE — Telephone Encounter (Signed)
Inbound call from patient requesting a call back to discuss coding of 9/4 colonoscopy. Please advise, thank you.

## 2023-04-23 ENCOUNTER — Ambulatory Visit (AMBULATORY_SURGERY_CENTER): Payer: BC Managed Care – PPO | Admitting: Gastroenterology

## 2023-04-23 ENCOUNTER — Encounter: Payer: Self-pay | Admitting: Gastroenterology

## 2023-04-23 VITALS — BP 107/60 | HR 75 | Temp 98.2°F | Resp 13 | Ht 63.0 in | Wt 134.0 lb

## 2023-04-23 DIAGNOSIS — K5730 Diverticulosis of large intestine without perforation or abscess without bleeding: Secondary | ICD-10-CM

## 2023-04-23 DIAGNOSIS — Z1211 Encounter for screening for malignant neoplasm of colon: Secondary | ICD-10-CM | POA: Diagnosis not present

## 2023-04-23 DIAGNOSIS — R195 Other fecal abnormalities: Secondary | ICD-10-CM | POA: Diagnosis not present

## 2023-04-23 DIAGNOSIS — K529 Noninfective gastroenteritis and colitis, unspecified: Secondary | ICD-10-CM | POA: Diagnosis not present

## 2023-04-23 DIAGNOSIS — K633 Ulcer of intestine: Secondary | ICD-10-CM | POA: Diagnosis not present

## 2023-04-23 DIAGNOSIS — K641 Second degree hemorrhoids: Secondary | ICD-10-CM

## 2023-04-23 MED ORDER — SODIUM CHLORIDE 0.9 % IV SOLN: 500.0000 mL | INTRAVENOUS | Status: DC

## 2023-04-23 NOTE — Op Note (Signed)
[facility] Patient Name: Erica Ross Procedure Date: 04/23/2023 11:22 AM MRN: [id number] Endoscopist: Doristine Locks , MD, [id number] Age: 47 Referring MD:  Date of Birth: May 12, 1976 Gender: Female Account #: [id number] Procedure:                Colonoscopy Indications:              Screening for colorectal malignant neoplasm, This                            is the patient's first colonoscopy.                           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 Medicines:                Monitored Anesthesia Care Procedure:                Pre-Anesthesia Assessment:                           - Prior to the procedure, a History and Physical                            was performed, and patient medications and                            allergies were reviewed. The patient's tolerance of                            previous anesthesia was also reviewed. The risks                            and benefits of the procedure and the sedation                            options and risks were discussed with the patient.                            All questions were answered, and informed consent                            was obtained. Prior Anticoagulants: The patient has                            taken no anticoagulant or antiplatelet agents. ASA                            Grade Assessment: II - A patient with mild systemic  disease. After reviewing the risks and benefits,                            the patient was deemed in satisfactory condition to                            undergo the procedure.                           After obtaining informed consent, the colonoscope                            was passed under direct vision. Throughout the                            procedure, the patient's blood pressure, pulse, and                             oxygen saturations were monitored continuously. The                            [id number]-[id number] Colonoscope [id number] was introduced                            through the anus and advanced to the the                            ileocolonic anastomosis and neo-terminal ileum. The                            colonoscopy was performed without difficulty. The                            patient tolerated the procedure well. The quality                            of the bowel preparation was good. The rectum,                            ileocolonic anastamosis and neo terminal ileum were                            photographed. Scope In: 11:37:00 AM Scope Out: 11:53:19 AM Scope Withdrawal Time: 0 hours 12 minutes 39 seconds  Total Procedure Duration: 0 hours 16 minutes 19 seconds  Findings:                 Hemorrhoids were found on perianal exam.                           A single small, superficial ulcer with surrounding                            mucosal erythema/edema was found in the descending  colon. No bleeding was present. Biopsies were taken                            with a cold forceps for histology. Estimated blood                            loss was minimal.                           A few small-mouthed diverticula were found in the                            sigmoid colon.                           There was evidence of a prior end-to-side                            ileo-colonic anastomosis in the transverse colon.                            This was patent and was characterized by healthy                            appearing mucosa. The anastomosis was traversed.                           The neo-terminal ileum appeared normal.                           Non-bleeding internal hemorrhoids were found during                            retroflexion. The hemorrhoids were small and Grade                            II (internal hemorrhoids that prolapse  but reduce                            spontaneously). Complications:            No immediate complications. Estimated Blood Loss:     Estimated blood loss was minimal. Impression:               - Hemorrhoids found on perianal exam.                           - A single small, superficial ulcer with                            surrounding mucosal erythema/edema noted in the                            descending colon. Biopsied. The mucosa throughout  the remainder of the colon was otherwise normal                            appearing.                           - Diverticulosis in the sigmoid colon.                           - Patent end-to-side ileo-colonic anastomosis,                            characterized by healthy appearing mucosa.                           - The examined portion of the ileum was normal.                           - Non-bleeding internal hemorrhoids. Recommendation:           - Patient has a contact number available for                            emergencies. The signs and symptoms of potential                            delayed complications were discussed with the                            patient. Return to normal activities tomorrow.                            Written discharge instructions were provided to the                            patient.                           - Resume previous diet.                           - Continue present medications.                           - Await pathology results.                           - Repeat colonoscopy in 10 years for screening                            purposes.                           - Return to GI clinic PRN. Doristine Locks, MD 04/23/2023 12:02:30 PM

## 2023-04-23 NOTE — Progress Notes (Signed)
GASTROENTEROLOGY PROCEDURE H&P NOTE   Primary Care Physician: Bennie Pierini, FNP    Reason for Procedure:  Colon cancer screening  Plan:    Colonoscopy  Patient is appropriate for endoscopic procedure(s) in the ambulatory ([facility]) setting.  The nature of the procedure, as well as the risks, benefits, and alternatives were carefully and thoroughly reviewed with the patient. Ample time for discussion and questions allowed. The patient understood, was satisfied, and agreed to proceed.     HPI: Erica Ross is a 47 y.o. female who presents for colonoscopy for CRC screening.  No previous colonoscopy.  No family history of colon cancer.  Past Medical History:  Diagnosis Date   Adnexal mass 03/02/2018   Right 5.1cm   Allergy    Anxiety    Gallstones    GERD (gastroesophageal reflux disease)    occ    Hay fever    Hernia, umbilical    Kidney stones    Left breast mass    Renal calculus, right    UTI (urinary tract infection)     Past Surgical History:  Procedure Laterality Date   CYSTOSCOPY WITH STENT PLACEMENT Bilateral 04/29/2018   Procedure: CYSTOSCOPY WITH  BILATERAL RETROGRADE PYELOGRAM RIGHT STENT PLACEMENT;  Surgeon: Crist Fat, MD;  Location: [facility];  Service: Urology;  Laterality: Bilateral;   EXTRACORPOREAL SHOCK WAVE LITHOTRIPSY Right 07/25/2022   Procedure: EXTRACORPOREAL SHOCK WAVE LITHOTRIPSY (ESWL);  Surgeon: Noel Christmas, MD;  Location: [facility];  Service: Urology;  Laterality: Right;   IR RADIOLOGIST EVAL & MGMT  05/27/2018   LAPAROSCOPIC REMOVAL ABDOMINAL MASS N/A 04/29/2018   Procedure: LAPAROSCOPIC RIGHT HEMI-COLECTOMY ERAS PATHWAY;  Surgeon: Almond Lint, MD;  Location: [facility];  Service: General;  Laterality: N/A;    Prior to Admission medications   Medication Sig Start Date End Date Taking? Authorizing Provider  magnesium gluconate (MAGONATE) 500 MG tablet Take 500 mg by mouth 2 (two) times daily.   Yes  [provider]  Multiple Vitamins-Minerals (VITAMIN D3 COMPLETE PO) Take by mouth daily at 12 noon.   Yes [provider]  Probiotic Product (PROBIOTIC DAILY PO) Take by mouth.   Yes [provider]  Omega-3 Fatty Acids (FISH OIL ODOR-LESS PO) Take 1,500 mg by mouth daily at 12 noon.    [provider]  PARoxetine (PAXIL) 10 MG tablet Take 1 tablet by mouth daily.    [provider]    Current Outpatient Medications  Medication Sig Dispense Refill   magnesium gluconate (MAGONATE) 500 MG tablet Take 500 mg by mouth 2 (two) times daily.     Multiple Vitamins-Minerals (VITAMIN D3 COMPLETE PO) Take by mouth daily at 12 noon.     Probiotic Product (PROBIOTIC DAILY PO) Take by mouth.     Omega-3 Fatty Acids (FISH OIL ODOR-LESS PO) Take 1,500 mg by mouth daily at 12 noon.     PARoxetine (PAXIL) 10 MG tablet Take 1 tablet by mouth daily.     Current Facility-Administered Medications  Medication Dose Route Frequency Provider Last Rate Last Admin   0.9 %  sodium chloride infusion  500 mL Intravenous Continuous Aimy Sweeting V, DO        Allergies as of 04/23/2023 - Review Complete 04/23/2023  Allergen Reaction Noted   Watermelon [citrullus vulgaris] Itching 04/16/2018   Amoxicillin Rash 02/18/2015    Family History  Problem Relation Age of Onset   Diabetes Mother    Aneurysm Father  Diabetes Brother    Colon polyps Paternal Grandmother    Breast cancer Paternal Grandmother    Breast cancer Other    Liver disease Neg Hx    Esophageal cancer Neg Hx    Colon cancer Neg Hx    Rectal cancer Neg Hx    Stomach cancer Neg Hx     Social History   Socioeconomic History   Marital status: Married    Spouse name: Not on file   Number of children: 3   Years of education: Not on file   Highest education level: Not on file  Occupational History   Not on file  Tobacco Use   Smoking status: Never   Smokeless tobacco: Never  Vaping Use    Vaping status: Never Used  Substance and Sexual Activity   Alcohol use: No    Alcohol/week: 0.0 standard drinks of alcohol   Drug use: No   Sexual activity: Yes  Other Topics Concern   Not on file  Social History Narrative   Not on file   Social Determinants of Health   Financial Resource Strain: Not on file  Food Insecurity: Not on file  Transportation Needs: Not on file  Physical Activity: Not on file  Stress: Not on file  Social Connections: Not on file  Intimate Partner Violence: Not on file    Physical Exam: Vital signs in last 24 hours: @BP  (!) 140/93   Pulse 92   Temp 98.2 F (36.8 C) (Temporal)   Ht 5\' 3"  (1.6 m)   Wt 134 lb (60.8 kg)   LMP 04/17/2023   SpO2 100%   BMI 23.74 kg/m  GEN: NAD EYE: Sclerae anicteric ENT: MMM CV: Non-tachycardic Pulm: CTA b/l GI: Soft, NT/ND NEURO:  Alert & Oriented x 3   Doristine Locks, DO [facility]   04/23/2023 11:28 AM

## 2023-04-23 NOTE — Progress Notes (Signed)
Report to PACU, RN, vss, BBS= Clear.  

## 2023-04-23 NOTE — Patient Instructions (Signed)
-  await pathology results -repeat colonoscopy in 10 years for surveillance recommended. -Continue present medications  -Handout on diverticulosis and hemorrhoids provided   YOU HAD AN ENDOSCOPIC PROCEDURE TODAY AT THE [facility]:   Refer to the procedure report that was given to you for any specific questions about what was found during the examination.  If the procedure report does not answer your questions, please call your gastroenterologist to clarify.  If you requested that your care partner not be given the details of your procedure findings, then the procedure report has been included in a sealed envelope for you to review at your convenience later.  YOU SHOULD EXPECT: Some feelings of bloating in the abdomen. Passage of more gas than usual.  Walking can help get rid of the air that was put into your GI tract during the procedure and reduce the bloating. If you had a lower endoscopy (such as a colonoscopy or flexible sigmoidoscopy) you may notice spotting of blood in your stool or on the toilet paper. If you underwent a bowel prep for your procedure, you may not have a normal bowel movement for a few days.  Please Note:  You might notice some irritation and congestion in your nose or some drainage.  This is from the oxygen used during your procedure.  There is no need for concern and it should clear up in a day or so.  SYMPTOMS TO REPORT IMMEDIATELY:  Following lower endoscopy (colonoscopy or flexible sigmoidoscopy):  Excessive amounts of blood in the stool  Significant tenderness or worsening of abdominal pains  Swelling of the abdomen that is new, acute  Fever of 100F or higher   For urgent or emergent issues, a gastroenterologist can be reached at any hour by calling [phone]. Do not use MyChart messaging for urgent concerns.    DIET:  We do recommend a small meal at first, but then you may proceed to your regular diet.  Drink plenty of fluids but you should  avoid alcoholic beverages for 24 hours.  ACTIVITY:  You should plan to take it easy for the rest of today and you should NOT DRIVE or use heavy machinery until tomorrow (because of the sedation medicines used during the test).    FOLLOW UP: Our staff will call the number listed on your records the next business day following your procedure.  We will call around 7:15- 8:00 am to check on you and address any questions or concerns that you may have regarding the information given to you following your procedure. If we do not reach you, we will leave a message.     If any biopsies were taken you will be contacted by phone or by letter within the next 1-3 weeks.  Please call us at [phone] if you have not heard about the biopsies in 3 weeks.    SIGNATURES/CONFIDENTIALITY: You and/or your care partner have signed paperwork which will be entered into your electronic medical record.  These signatures attest to the fact that that the information above on your After Visit Summary has been reviewed and is understood.  Full responsibility of the confidentiality of this discharge information lies with you and/or your care-partner.

## 2023-04-23 NOTE — Progress Notes (Signed)
Called to room to assist during endoscopic procedure.  Patient ID and intended procedure confirmed with present staff. Received instructions for my participation in the procedure from the performing physician.  

## 2023-04-24 ENCOUNTER — Telehealth: Payer: Self-pay | Admitting: *Deleted

## 2023-04-24 NOTE — Telephone Encounter (Signed)
  Follow up Call-     04/23/2023   10:49 AM  Call back number  Post procedure Call Back phone  # [phone]  Permission to leave phone message Yes   [facility]

## 2023-04-28 ENCOUNTER — Other Ambulatory Visit: Payer: Self-pay | Admitting: *Deleted

## 2023-04-28 DIAGNOSIS — K529 Noninfective gastroenteritis and colitis, unspecified: Secondary | ICD-10-CM

## 2023-07-30 ENCOUNTER — Ambulatory Visit: Payer: BC Managed Care – PPO | Admitting: Gastroenterology

## 2023-08-26 ENCOUNTER — Ambulatory Visit: Payer: Self-pay | Admitting: Nurse Practitioner

## 2023-08-26 NOTE — Telephone Encounter (Signed)
 Left message for pt to call back to make appt

## 2023-08-26 NOTE — Telephone Encounter (Signed)
 Pt would like US . Chief Complaint: upper R ABD pain Symptoms: pain, indigestion, increased belching Frequency: 12/24 Pertinent Negatives: Patient denies fever, denies N/V,  Disposition: [] ED /[] Urgent Care (no appt availability in office) / [] Appointment(In office/virtual)/ []  [facility]/ [] Home Care/ [] Refused Recommended Disposition /[]  Mobile Bus/ [x]  Follow-up with PCP Additional Notes: Pt requesting US  for gallbladder. Has had appendectomy. Known ovarian cysts. Increased belching. Has had recent diet change, more american diet, and has noticed the pain then. Known gallbladder hx and states this is what she was told to watch for. Pt advised to seek ED treatment should she develop worsening s/s including: fever, N/V, jaundice. Pt agreeable.  Reason for Disposition . Abdominal pain is a chronic symptom (recurrent or ongoing AND present > 4 weeks)  Answer Assessment - Initial Assessment Questions 1. LOCATION: Where does it hurt?      Below ribs on R side 2. RADIATION: Does the pain shoot anywhere else? (e.g., chest, back)     Sometime goes into lower abd 3. ONSET: When did the pain begin? (e.g., minutes, hours or days ago)      12/24 4. SUDDEN: Gradual or sudden onset?     gradual 5. PATTERN Does the pain come and go, or is it constant?    - If it comes and goes: How long does it last? Do you have pain now?     (Note: Comes and goes means the pain is intermittent. It goes away completely between bouts.)    - If constant: Is it getting better, staying the same, or getting worse?      (Note: Constant means the pain never goes away completely; most serious pain is constant and gets worse.)      Constant once it starts 6. SEVERITY: How bad is the pain?  (e.g., Scale 1-10; mild, moderate, or severe)    - MILD (1-3): Doesn't interfere with normal activities, abdomen soft and not tender to touch.     - MODERATE (4-7): Interferes with normal activities  or awakens from sleep, abdomen tender to touch.     - SEVERE (8-10): Excruciating pain, doubled over, unable to do any normal activities.       Indigestion currently, after eating goes to 5 7. RECURRENT SYMPTOM: Have you ever had this type of stomach pain before? If Yes, ask: When was the last time? and What happened that time?      Yes told has gall stones, takes meds to help, changed diet. During holidays had changed diet back to american diet and suffering this pain since 8. CAUSE: What do you think is causing the stomach pain?     Known gallbladder stones,  9. RELIEVING/AGGRAVATING FACTORS: What makes it better or worse? (e.g., antacids, bending or twisting motion, bowel movement)     Eating makes it worse 10. OTHER SYMPTOMS: Do you have any other symptoms? (e.g., back pain, diarrhea, fever, urination pain, vomiting)       Some diarrhea, but is taking a lot of probiotics 11. PREGNANCY: Is there any chance you are pregnant? When was your last menstrual period?       denies  Protocols used: Abdominal Pain - [facility]

## 2023-08-27 ENCOUNTER — Telehealth: Payer: Self-pay | Admitting: Nurse Practitioner

## 2023-08-27 NOTE — Telephone Encounter (Signed)
 Copied from CRM [id number]. Topic: General - Call Back - No Documentation >> Aug 27, 2023 10:30 AM Erica Ross wrote: Reason for CRM: Patient is calling back regarding making an appointment, however, the patient doesn't want to have an appointment and then, have to make another appointment for an ultrasound - Is there a way that she could come in for both on the same day.

## 2023-09-01 ENCOUNTER — Telehealth: Payer: Self-pay

## 2023-09-01 NOTE — Telephone Encounter (Signed)
 Copied from CRM [id number]. Topic: Appointments - Scheduling Inquiry for Clinic >> Sep 01, 2023  9:54 AM Mosetta Putt H wrote: Reason for CRM: Can pt do a video visit for referral or does she need to be seen in person

## 2023-09-01 NOTE — Telephone Encounter (Signed)
 Please schedule patient for a video visit with MMM

## 2023-09-01 NOTE — Telephone Encounter (Signed)
 Apt scheduled.

## 2023-09-01 NOTE — Telephone Encounter (Signed)
Ok to schedule video visit

## 2023-09-01 NOTE — Telephone Encounter (Signed)
 Patient has a history of gallbladder problems and wants an ultrasound. Patient was already informed that she would need a visit. Wants to know if she can do a video visit for this

## 2023-09-02 DIAGNOSIS — N83201 Unspecified ovarian cyst, right side: Secondary | ICD-10-CM | POA: Diagnosis not present

## 2023-09-02 DIAGNOSIS — Z01411 Encounter for gynecological examination (general) (routine) with abnormal findings: Secondary | ICD-10-CM | POA: Diagnosis not present

## 2023-09-02 DIAGNOSIS — Z1151 Encounter for screening for human papillomavirus (HPV): Secondary | ICD-10-CM | POA: Diagnosis not present

## 2023-09-02 DIAGNOSIS — Z13228 Encounter for screening for other metabolic disorders: Secondary | ICD-10-CM | POA: Diagnosis not present

## 2023-09-02 DIAGNOSIS — E559 Vitamin D deficiency, unspecified: Secondary | ICD-10-CM | POA: Diagnosis not present

## 2023-09-02 DIAGNOSIS — Z124 Encounter for screening for malignant neoplasm of cervix: Secondary | ICD-10-CM | POA: Diagnosis not present

## 2023-09-02 DIAGNOSIS — Z130 Encounter for screening for diseases of the blood and blood-forming organs and certain disorders involving the immune mechanism: Secondary | ICD-10-CM | POA: Diagnosis not present

## 2023-09-02 DIAGNOSIS — F3281 Premenstrual dysphoric disorder: Secondary | ICD-10-CM | POA: Diagnosis not present

## 2023-09-02 DIAGNOSIS — Z01419 Encounter for gynecological examination (general) (routine) without abnormal findings: Secondary | ICD-10-CM | POA: Diagnosis not present

## 2023-09-02 DIAGNOSIS — Z13220 Encounter for screening for lipoid disorders: Secondary | ICD-10-CM | POA: Diagnosis not present

## 2023-09-02 DIAGNOSIS — Z833 Family history of diabetes mellitus: Secondary | ICD-10-CM | POA: Diagnosis not present

## 2023-09-04 ENCOUNTER — Encounter: Payer: Self-pay | Admitting: Nurse Practitioner

## 2023-09-04 ENCOUNTER — Telehealth (INDEPENDENT_AMBULATORY_CARE_PROVIDER_SITE_OTHER): Payer: BC Managed Care – PPO | Admitting: Nurse Practitioner

## 2023-09-04 DIAGNOSIS — K819 Cholecystitis, unspecified: Secondary | ICD-10-CM | POA: Diagnosis not present

## 2023-09-04 NOTE — Patient Instructions (Signed)
   Cholecystitis Cholecystitis is irritation and swelling (inflammation) of the gallbladder. The gallbladder: Is an organ that is shaped like a pear. Is under the liver on the right side of the body. Stores bile. Bile helps the body break down (digest) the fats in food. This condition can occur all of a sudden. It needs to be treated. What are the causes? This condition may be caused by stones or lumps that form in the gallbladder (gallstones). Gallstones can block the tube (duct) that carries bile out of your gallbladder. Other causes include: Damage to the gallbladder due to less blood flow. Germs in the bile ducts. Scars, kinks, or adhesions in the bile ducts. Abnormal growths (tumors) in the liver, pancreas, or gallbladder. What increases the risk? You are more likely to develop this condition if: You are female and between the ages of 24-62. You take birth control pills. You use estrogen. You take certain medicines that make you more likely to develop gallstones. You are overweight (obese). You have a very bad reaction to an infection (sepsis). You have been hospitalized due to a serious condition, such as a burn or illness. You have not eaten or drank for a long time. What are the signs or symptoms? Symptoms of this condition include: Pain in the upper right part of the belly (abdomen). A lump over the gallbladder. Bloating in the belly. Feeling sick to your stomach (nauseous). Vomiting. Fever. Chills. How is this treated? This condition may be treated with: Medicines to treat pain. Giving fluids through an IV tube. Not eating or drinking (fasting). Antibiotic medicines. Surgery to take out your gallbladder. Gallbladder drainage. Follow these instructions at home: Medicines  Take over-the-counter and prescription medicines only as told by your doctor. If you were prescribed an antibiotic medicine, take it as told by your doctor. Do not stop taking it even if you  start to feel better. General instructions Follow instructions from your doctor about what to eat or drink. Do not eat or drink anything that makes you sick again. Do not smoke or use any products that contain nicotine or tobacco. If you need help quitting, ask your doctor. Keep all follow-up visits. Contact a doctor if: You have pain and your medicine does not help. You have a fever. Get help right away if: Your pain moves to: Another part of your belly. Your back. Your symptoms do not go away. You have new symptoms. These symptoms may be an emergency. Get help right away. Call 911. Do not wait to see if the symptoms will go away. Do not drive yourself to the hospital. Summary This condition may be caused by stones or lumps that form in the gallbladder (gallstones). A common symptom is pain in your belly. This condition may be treated with surgery to take out your gallbladder. Follow instructions from your doctor about what to eat or drink. This information is not intended to replace advice given to you by your health care provider. Make sure you discuss any questions you have with your health care provider. Document Revised: 02/06/2021 Document Reviewed: 02/06/2021 Elsevier Patient Education  2024 [employer].

## 2023-09-04 NOTE — Progress Notes (Signed)
Virtual Visit Consent   Erica Ross, you are scheduled for a virtual visit with Mary-Margaret Daphine Deutscher, FNP, a [facility] provider, today.     Just as with appointments in the office, your consent must be obtained to participate.  Your consent will be active for this visit and any virtual visit you may have with one of our providers in the next 365 days.     If you have a MyChart account, a copy of this consent can be sent to you electronically.  All virtual visits are billed to your insurance company just like a traditional visit in the office.    As this is a virtual visit, video technology does not allow for your provider to perform a traditional examination.  This may limit your provider's ability to fully assess your condition.  If your provider identifies any concerns that need to be evaluated in person or the need to arrange testing (such as labs, EKG, etc.), we will make arrangements to do so.     Although advances in technology are sophisticated, we cannot ensure that it will always work on either your end or our end.  If the connection with a video visit is poor, the visit may have to be switched to a telephone visit.  With either a video or telephone visit, we are not always able to ensure that we have a secure connection.     I need to obtain your verbal consent now.   Are you willing to proceed with your visit today? YES   Erica Ross has provided verbal consent on 09/04/2023 for a virtual visit (video or telephone).   Mary-Margaret Daphine Deutscher, FNP   Date: 09/04/2023 1:58 PM   Virtual Visit via Video Note   I, Mary-Margaret Daphine Deutscher, connected with Erica Ross ([id number], 1976/04/24) on 09/04/23 at  3:50 PM EST by a video-enabled telemedicine application and verified that I am speaking with the correct person using two identifiers.  Location: Patient: Virtual Visit Location Patient: Home Provider: Virtual Visit Location Provider: Mobile   I discussed the limitations of  evaluation and management by telemedicine and the availability of in person appointments. The patient expressed understanding and agreed to proceed.    History of Present Illness: Erica Ross is a 48 y.o. who identifies as a female who was assigned female at birth, and is being seen today for gall bladder issues.  HPI: Patient had gall bnladder issues a few years ago. Has flared up since Christmas. Has pain after eating and pain radiates into back. Rate [place] 5/10. Will resolve on its own. Would ike repeat gallbladder u/s    Review of Systems  Constitutional:  Negative for diaphoresis and weight loss.  Eyes:  Negative for blurred vision, double vision and pain.  Respiratory:  Negative for shortness of breath.   Cardiovascular:  Negative for chest pain, palpitations, orthopnea and leg swelling.  Gastrointestinal:  Negative for abdominal pain.  Skin:  Negative for rash.  Neurological:  Negative for dizziness, sensory change, loss of consciousness, weakness and headaches.  Endo/Heme/Allergies:  Negative for polydipsia. Does not bruise/bleed easily.  Psychiatric/Behavioral:  Negative for memory loss. The patient does not have insomnia.   All other systems reviewed and are negative.   Problems:  Patient Active Problem List   Diagnosis Date Noted   Frequency of micturition 04/18/2018    Allergies:  Allergies  Allergen Reactions   Watermelon [Citrullus Vulgaris] Itching    Throat itches.   Amoxicillin  Rash    Has patient had a PCN reaction causing immediate rash, facial/tongue/throat swelling, SOB or lightheadedness with hypotension: No Has patient had a PCN reaction causing severe rash involving mucus membranes or skin necrosis: No Has patient had a PCN reaction that required hospitalization: No Has patient had a PCN reaction occurring within the last 10 years: Yes--very mild rash If all of the above answers are "NO", then may proceed with Cephalosporin use.    Medications:   Current Outpatient Medications:    magnesium gluconate (MAGONATE) 500 MG tablet, Take 500 mg by mouth 2 (two) times daily., Disp: , Rfl:    Multiple Vitamins-Minerals (VITAMIN D3 COMPLETE PO), Take by mouth daily at 12 noon., Disp: , Rfl:    Omega-3 Fatty Acids (FISH OIL ODOR-LESS PO), Take 1,500 mg by mouth daily at 12 noon., Disp: , Rfl:    PARoxetine (PAXIL) 10 MG tablet, Take 1 tablet by mouth daily., Disp: , Rfl:    Probiotic Product (PROBIOTIC DAILY PO), Take by mouth., Disp: , Rfl:   Observations/Objective: Patient is well-developed, well-nourished in no acute distress.  Resting comfortably  at home.  Head is normocephalic, atraumatic.  No labored breathing.  Speech is clear and coherent with logical content.  Patient is alert and oriented at baseline.  Mild right upper quadrant pain on palpation  Assessment and Plan:  Alpha Gula in today with chief complaint of No chief complaint on file.   1. Cholecystitis (Primary) Avoid spicy and fatty foods Order U/S     Follow Up Instructions: I discussed the assessment and treatment plan with the patient. The patient was provided an opportunity to ask questions and all were answered. The patient agreed with the plan and demonstrated an understanding of the instructions.  A copy of instructions were sent to the patient via MyChart.  The patient was advised to call back or seek an in-person evaluation if the symptoms worsen or if the condition fails to improve as anticipated.  Time:  I spent 7 minutes with the patient via telehealth technology discussing the above problems/concerns.    Mary-Margaret Daphine Deutscher, FNP

## 2023-09-11 ENCOUNTER — Telehealth: Payer: BC Managed Care – PPO | Admitting: Nurse Practitioner

## 2023-09-18 ENCOUNTER — Ambulatory Visit: Payer: BC Managed Care – PPO | Admitting: Nurse Practitioner

## 2023-09-22 ENCOUNTER — Other Ambulatory Visit: Payer: BC Managed Care – PPO

## 2023-11-03 ENCOUNTER — Ambulatory Visit
Admission: RE | Admit: 2023-11-03 | Discharge: 2023-11-03 | Disposition: A | Discharge status: Home / Self Care | Source: Ambulatory Visit | Attending: Nurse Practitioner | Admitting: Nurse Practitioner

## 2023-11-03 DIAGNOSIS — K8020 Calculus of gallbladder without cholecystitis without obstruction: Secondary | ICD-10-CM

## 2023-11-03 DIAGNOSIS — K819 Cholecystitis, unspecified: Secondary | ICD-10-CM

## 2023-11-05 ENCOUNTER — Ambulatory Visit: Payer: BC Managed Care – PPO | Admitting: Gastroenterology

## 2024-02-17 DIAGNOSIS — E538 Deficiency of other specified B group vitamins: Secondary | ICD-10-CM | POA: Diagnosis not present

## 2024-02-17 DIAGNOSIS — E611 Iron deficiency: Secondary | ICD-10-CM | POA: Diagnosis not present

## 2024-02-17 DIAGNOSIS — K580 Irritable bowel syndrome with diarrhea: Secondary | ICD-10-CM | POA: Diagnosis not present

## 2024-02-17 DIAGNOSIS — G4700 Insomnia, unspecified: Secondary | ICD-10-CM | POA: Diagnosis not present

## 2024-02-17 DIAGNOSIS — E559 Vitamin D deficiency, unspecified: Secondary | ICD-10-CM | POA: Diagnosis not present

## 2024-02-17 DIAGNOSIS — Z1329 Encounter for screening for other suspected endocrine disorder: Secondary | ICD-10-CM | POA: Diagnosis not present

## 2024-02-17 DIAGNOSIS — R519 Headache, unspecified: Secondary | ICD-10-CM | POA: Diagnosis not present

## 2024-02-17 DIAGNOSIS — M2550 Pain in unspecified joint: Secondary | ICD-10-CM | POA: Diagnosis not present

## 2024-02-17 DIAGNOSIS — Z131 Encounter for screening for diabetes mellitus: Secondary | ICD-10-CM | POA: Diagnosis not present

## 2024-02-17 DIAGNOSIS — Z1321 Encounter for screening for nutritional disorder: Secondary | ICD-10-CM | POA: Diagnosis not present

## 2024-02-17 DIAGNOSIS — N959 Unspecified menopausal and perimenopausal disorder: Secondary | ICD-10-CM | POA: Diagnosis not present

## 2024-03-16 DIAGNOSIS — R519 Headache, unspecified: Secondary | ICD-10-CM | POA: Diagnosis not present

## 2024-03-16 DIAGNOSIS — G4700 Insomnia, unspecified: Secondary | ICD-10-CM | POA: Diagnosis not present

## 2024-03-16 DIAGNOSIS — M2550 Pain in unspecified joint: Secondary | ICD-10-CM | POA: Diagnosis not present

## 2024-03-16 DIAGNOSIS — K580 Irritable bowel syndrome with diarrhea: Secondary | ICD-10-CM | POA: Diagnosis not present

## 2024-04-06 ENCOUNTER — Ambulatory Visit: Payer: Self-pay

## 2024-04-06 ENCOUNTER — Encounter: Payer: Self-pay | Admitting: Nurse Practitioner

## 2024-04-06 ENCOUNTER — Ambulatory Visit (INDEPENDENT_AMBULATORY_CARE_PROVIDER_SITE_OTHER)

## 2024-04-06 ENCOUNTER — Ambulatory Visit: Admitting: Nurse Practitioner

## 2024-04-06 VITALS — BP 137/82 | HR 76 | Temp 98.5°F | Ht 63.0 in | Wt 155.0 lb

## 2024-04-06 DIAGNOSIS — M25551 Pain in right hip: Secondary | ICD-10-CM

## 2024-04-06 MED ORDER — PREDNISONE 20 MG PO TABS
40.0000 mg | ORAL_TABLET | Freq: Every day | ORAL | 0 refills | Status: AC
Start: 2024-04-06 — End: 2024-04-11

## 2024-04-06 NOTE — Telephone Encounter (Signed)
 Appointment today 04/06/2024 at 9:30 AM with Ronal Rollene Lunger FNP  FYI Only or Action Required?: FYI only for provider.  Patient was last seen in primary care on 09/04/2023 by Lunger Mustard, FNP.  Called Nurse Triage reporting Left Hip Pain.  Symptoms began 6 days ago.  Interventions attempted: OTC medications: Tylenol , Ibuprofen, Rest, hydration, or home remedies, and Ice/heat application.  Symptoms are: gradually worsening.  Triage Disposition: See PCP When Office is Open (Within 3 Days)  Patient/caregiver understands and will follow disposition?: Yes               Copied from CRM #[id number]. Topic: Clinical - Red Word Triage >> Apr 06, 2024  8:02 AM Erica Ross wrote: Red Word that prompted transfer to Nurse Triage: A lot of hip pain. Worse at night, not getting much rest Reason for Disposition  [1] MODERATE pain (e.g., interferes with normal activities, limping) AND [2] present > 3 days  Answer Assessment - Initial Assessment Questions Pain all around left hip Took Tylenol  Cyst found months ago on ovary Patient states that it correlates with the phase of her cycle Patient wants to rule out bursitis or any old injury---patient asked specifically about possibly getting an xray done to assess this area     1. LOCATION and RADIATION: Where is the pain located? Does the pain spread (shoot) anywhere else?     Left Hip Pain 2. QUALITY: What does the pain feel like?  (e.g., sharp, dull, aching, burning)     Throbbing/ache 3. SEVERITY: How bad is the pain? What does it keep you from doing?   (Scale 1-10; or mild, moderate, severe)     1-2 4. ONSET: When did the pain start? Does it come and go, or is it there all the time?     Last Wednesday 5. WORK OR EXERCISE: Has there been any recent work or exercise that involved this part of the body?      Daily 6. CAUSE: What do you think is causing the hip pain?      unsure 7. AGGRAVATING FACTORS:  What makes the hip pain worse? (e.g., walking, climbing stairs, running)     Correlates with menstrual cycle 8. OTHER SYMPTOMS: Do you have any other symptoms? (e.g., back pain, pain shooting down leg,  fever, rash)     --------  Protocols used: Hip Pain-[facility]

## 2024-04-06 NOTE — Progress Notes (Signed)
   Subjective:    Patient ID: Erica Ross, female    DOB: 03-31-1976, 48 y.o.   MRN: [id number]   Chief Complaint: Hip Pain (Right hip. Worse at night time. Started one month ago)   Hip Pain     Left hip pain. Started intermittently several months ago. Has gotten worse the last several weeks. The pain is in right hip and will radiate to right groin area. Laying down increases pain. Throbbing pain at night and achy pain during the day. She has been taking  tylenol  and motrin rarely. Rates pain 2-3/10 currently.  Patient Active Problem List   Diagnosis Date Noted   Frequency of micturition 04/18/2018       Review of Systems  Constitutional:  Negative for diaphoresis.  Eyes:  Negative for pain.  Respiratory:  Negative for shortness of breath.   Cardiovascular:  Negative for chest pain, palpitations and leg swelling.  Gastrointestinal:  Negative for abdominal pain.  Endocrine: Negative for polydipsia.  Skin:  Negative for rash.  Neurological:  Negative for dizziness, weakness and headaches.  Hematological:  Does not bruise/bleed easily.  All other systems reviewed and are negative.      Objective:   Physical Exam Constitutional:      Appearance: Normal appearance.  Cardiovascular:     Rate and Rhythm: Normal rate and regular rhythm.     Heart sounds: Normal heart sounds.  Pulmonary:     Effort: Pulmonary effort is normal.     Breath sounds: Normal breath sounds.  Skin:    General: Skin is warm.  Neurological:     General: No focal deficit present.     Mental Status: She is alert and oriented to person, place, and time.  Psychiatric:        Mood and Affect: Mood normal.        Behavior: Behavior normal.    BP 137/82   Pulse 76   Temp 98.5 F (36.9 C) (Temporal)   Ht 5' 3 (1.6 m)   Wt 155 lb (70.3 kg)   SpO2 100%   BMI 27.46 kg/m   Right hip xray- mild osteoarthritis-Preliminary reading by Ronal Lunger, FNP  [facility]        Assessment & Plan:  Erica Ross in today with chief complaint of Hip Pain (Right hip. Worse at night time. Started one month ago)   1. Right hip pain (Primary) Ice Rest RTO prn - DG HIP UNILAT W OR W/O PELVIS 2-3 VIEWS RIGHT - predniSONE  (DELTASONE ) 20 MG tablet; Take 2 tablets (40 mg total) by mouth daily with breakfast for 5 days. 2 po daily for 5 days  Dispense: 10 tablet; Refill: 0    The above assessment and management plan was discussed with the patient. The patient verbalized understanding of and has agreed to the management plan. Patient is aware to call the clinic if symptoms persist or worsen. Patient is aware when to return to the clinic for a follow-up visit. Patient educated on when it is appropriate to go to the emergency department.   Mary-Margaret Lunger, FNP

## 2024-04-06 NOTE — Patient Instructions (Signed)
 Hip Pain The hip is the joint between the upper legs and the lower pelvis. The bones, cartilage, tendons, and muscles of your hip joint support your body and allow you to move around. Hip pain can range from a minor ache to severe pain in one or both of your hips. The pain may be felt on the inside of the hip joint near the groin, or on the outside near the buttocks and upper thigh. You may also have swelling or stiffness in your hip area. Follow these instructions at home: Managing pain, stiffness, and swelling     If told, put ice on the painful area. Put ice in a plastic bag. Place a towel between your skin and the bag. Leave the ice on for 20 minutes, 2-3 times a day. If told, apply heat to the affected area as often as told by your health care provider. Use the heat source that your provider recommends, such as a moist heat pack or a heating pad. Place a towel between your skin and the heat source. Leave the heat on for 20-30 minutes. If your skin turns bright red, remove the ice or heat right away to prevent skin damage. The risk of damage is higher if you cannot feel pain, heat, or cold. Activity Do exercises as told by your provider. Avoid activities that cause pain. General instructions  Take over-the-counter and prescription medicines only as told by your provider. Keep a journal of your symptoms. Write down: How often you have hip pain. The location of your pain. What the pain feels like. What makes the pain worse. Sleep with a pillow between your legs on your most comfortable side. Keep all follow-up visits. Your provider will monitor your pain and activity. Contact a health care provider if: You cannot put weight on your leg. Your pain or swelling gets worse after a week. It gets harder to walk. You have a fever. Get help right away if: You fall. You have a sudden increase in pain and swelling in your hip. Your hip is red or swollen or very tender to touch. This  information is not intended to replace advice given to you by your health care provider. Make sure you discuss any questions you have with your health care provider. Document Revised: 04/09/2022 Document Reviewed: 04/09/2022 Elsevier Patient Education  2024 [employer].

## 2024-04-06 NOTE — Telephone Encounter (Signed)
 Appointment scheduled.

## 2024-04-09 DIAGNOSIS — R102 Pelvic and perineal pain: Secondary | ICD-10-CM | POA: Diagnosis not present

## 2024-05-31 DIAGNOSIS — M2550 Pain in unspecified joint: Secondary | ICD-10-CM | POA: Diagnosis not present

## 2024-05-31 DIAGNOSIS — Z1329 Encounter for screening for other suspected endocrine disorder: Secondary | ICD-10-CM | POA: Diagnosis not present

## 2024-05-31 DIAGNOSIS — G4700 Insomnia, unspecified: Secondary | ICD-10-CM | POA: Diagnosis not present

## 2024-05-31 DIAGNOSIS — E611 Iron deficiency: Secondary | ICD-10-CM | POA: Diagnosis not present

## 2024-05-31 DIAGNOSIS — Z1321 Encounter for screening for nutritional disorder: Secondary | ICD-10-CM | POA: Diagnosis not present

## 2024-05-31 DIAGNOSIS — K580 Irritable bowel syndrome with diarrhea: Secondary | ICD-10-CM | POA: Diagnosis not present

## 2024-05-31 DIAGNOSIS — R519 Headache, unspecified: Secondary | ICD-10-CM | POA: Diagnosis not present

## 2024-06-04 ENCOUNTER — Ambulatory Visit: Payer: Self-pay | Admitting: General Surgery

## 2024-06-04 NOTE — H&P (Signed)
 Chief Complaint: No chief complaint on file.     History of Present Illness: Erica Ross is a 48 y.o. female who is seen today as an office consultation at the request of Dr. Gladis for evaluation of No chief complaint on file. Erica Ross    History of Present Illness Erica Ross is a 48 year old female with gallstones who presents with worsening gallbladder pain. She is accompanied by her daughter-in-law. She was referred by Dr. Kristi for evaluation of gallstones.  Right upper quadrant abdominal pain - Worsening pain since last Christmas, with increased severity over the past six weeks - Pain is intermittent, occurring daily - Located under the right rib cage - Radiates to the back and neck at times - Associated with diarrhea and occasional nausea - No vomiting  Cholelithiasis - Gallstones diagnosed in 2019 following a CT scan - Ultrasound revealed a 2.1 cm gallstone, the largest of several - Gallbladder measures 4.1 cm  Altered bowel habits - Diarrhea present, attributed to either gallbladder disease or possible inflammatory bowel disease - Colonoscopy in September 2024 suggested possible ulcerative colitis or indeterminate inflammatory bowel disease  Medication and dietary modifications - Taking Tarka, which she believes has helped manage gallbladder symptoms - Dietary improvements implemented - Continued pain and digestive issues despite these measures  History of abdominal surgery and resection - Prior umbilical hernia repair with abdominal incision by Dr. Aron - History of resection of sections of small and large intestines, resulting in altered gastrointestinal function  Hematologic findings - Low ferritin level, currently 15 (previously 6) - Normal hemoglobin - Easy bruising    US : IMPRESSION: Cholelithiasis without evidence of acute cholecystitis    Review of Systems: A complete review of systems was obtained from the patient.  I have reviewed this  information and discussed as appropriate with the patient.  See HPI as well for other ROS.  Review of Systems  Constitutional:  Negative for fever.  HENT:  Negative for congestion.   Eyes:  Negative for blurred vision.  Respiratory:  Negative for cough, shortness of breath and wheezing.   Cardiovascular:  Negative for chest pain and palpitations.  Gastrointestinal:  Positive for abdominal pain, nausea and vomiting. Negative for heartburn.  Genitourinary:  Negative for dysuria.  Musculoskeletal:  Negative for myalgias.  Skin:  Negative for rash.  Neurological:  Negative for dizziness and headaches.  Psychiatric/Behavioral:  Negative for depression and suicidal ideas.   All other systems reviewed and are negative.     Medical History: No past medical history on file.  There is no problem list on file for this patient.   No past surgical history on file.   Not on File  No current outpatient medications on file prior to visit.  No current facility-administered medications on file prior to visit.   No family history on file.   Social History  Tobacco Use Smoking Status Not on file Smokeless Tobacco Not on file    Social History  Socioeconomic History  Marital status: Married  Social Drivers of Health  Housing Stability: Unknown (06/04/2024)  Housing Stability Vital Sign   Homeless in the Last Year: No   Objective:   There were no vitals filed for this visit.  There is no height or weight on file to calculate BMI.  Physical Exam Constitutional:      Appearance: Normal appearance.  HENT:     Head: Normocephalic and atraumatic.     Mouth/Throat:     Mouth: Mucous membranes are  moist.     Pharynx: Oropharynx is clear.  Eyes:     General: No scleral icterus.    Pupils: Pupils are equal, round, and reactive to light.  Cardiovascular:     Rate and Rhythm: Normal rate and regular rhythm.     Pulses: Normal pulses.     Heart sounds: No murmur heard.    No  friction rub. No gallop.  Pulmonary:     Effort: Pulmonary effort is normal. No respiratory distress.     Breath sounds: Normal breath sounds. No stridor.  Abdominal:     General: Abdomen is flat.  Musculoskeletal:        General: No swelling.  Skin:    General: Skin is warm.  Neurological:     General: No focal deficit present.     Mental Status: She is alert and oriented to person, place, and time. Mental status is at baseline.  Psychiatric:        Mood and Affect: Mood normal.        Thought Content: Thought content normal.        Judgment: Judgment normal.      Assessment and Plan: Diagnoses and all orders for this visit:  Symptomatic cholelithiasis    Erica Ross is a 48 y.o. female   1.  We will proceed to the OR for a lap cholecystectomy. 2. All risks and benefits were discussed with the patient to generally include: infection, bleeding, possible need for post op ERCP, damage to the bile ducts, and bile leak. Alternatives were offered and described.  All questions were answered and the patient voiced understanding of the procedure and wishes to proceed at this point with a laparoscopic cholecystectomy      No follow-ups on file.  Lynda Leos, MD, [facility], [place] [facility]

## 2024-06-04 NOTE — H&P (View-Only) (Signed)
 Chief Complaint: No chief complaint on file.     History of Present Illness: Erica Ross is a 48 y.o. female who is seen today as an office consultation at the request of Dr. Gladis for evaluation of No chief complaint on file. SABRA    History of Present Illness Erica Ross is a 48 year old female with gallstones who presents with worsening gallbladder pain. She is accompanied by her daughter-in-law. She was referred by Dr. Kristi for evaluation of gallstones.  Right upper quadrant abdominal pain - Worsening pain since last Christmas, with increased severity over the past six weeks - Pain is intermittent, occurring daily - Located under the right rib cage - Radiates to the back and neck at times - Associated with diarrhea and occasional nausea - No vomiting  Cholelithiasis - Gallstones diagnosed in 2019 following a CT scan - Ultrasound revealed a 2.1 cm gallstone, the largest of several - Gallbladder measures 4.1 cm  Altered bowel habits - Diarrhea present, attributed to either gallbladder disease or possible inflammatory bowel disease - Colonoscopy in September 2024 suggested possible ulcerative colitis or indeterminate inflammatory bowel disease  Medication and dietary modifications - Taking Tarka, which she believes has helped manage gallbladder symptoms - Dietary improvements implemented - Continued pain and digestive issues despite these measures  History of abdominal surgery and resection - Prior umbilical hernia repair with abdominal incision by Dr. Aron - History of resection of sections of small and large intestines, resulting in altered gastrointestinal function  Hematologic findings - Low ferritin level, currently 15 (previously 6) - Normal hemoglobin - Easy bruising    US : IMPRESSION: Cholelithiasis without evidence of acute cholecystitis    Review of Systems: A complete review of systems was obtained from the patient.  I have reviewed this  information and discussed as appropriate with the patient.  See HPI as well for other ROS.  Review of Systems  Constitutional:  Negative for fever.  HENT:  Negative for congestion.   Eyes:  Negative for blurred vision.  Respiratory:  Negative for cough, shortness of breath and wheezing.   Cardiovascular:  Negative for chest pain and palpitations.  Gastrointestinal:  Positive for abdominal pain, nausea and vomiting. Negative for heartburn.  Genitourinary:  Negative for dysuria.  Musculoskeletal:  Negative for myalgias.  Skin:  Negative for rash.  Neurological:  Negative for dizziness and headaches.  Psychiatric/Behavioral:  Negative for depression and suicidal ideas.   All other systems reviewed and are negative.     Medical History: No past medical history on file.  There is no problem list on file for this patient.   No past surgical history on file.   Not on File  No current outpatient medications on file prior to visit.  No current facility-administered medications on file prior to visit.   No family history on file.   Social History  Tobacco Use Smoking Status Not on file Smokeless Tobacco Not on file    Social History  Socioeconomic History  Marital status: Married  Social Drivers of Health  Housing Stability: Unknown (06/04/2024)  Housing Stability Vital Sign   Homeless in the Last Year: No   Objective:   There were no vitals filed for this visit.  There is no height or weight on file to calculate BMI.  Physical Exam Constitutional:      Appearance: Normal appearance.  HENT:     Head: Normocephalic and atraumatic.     Mouth/Throat:     Mouth: Mucous membranes are  moist.     Pharynx: Oropharynx is clear.  Eyes:     General: No scleral icterus.    Pupils: Pupils are equal, round, and reactive to light.  Cardiovascular:     Rate and Rhythm: Normal rate and regular rhythm.     Pulses: Normal pulses.     Heart sounds: No murmur heard.    No  friction rub. No gallop.  Pulmonary:     Effort: Pulmonary effort is normal. No respiratory distress.     Breath sounds: Normal breath sounds. No stridor.  Abdominal:     General: Abdomen is flat.  Musculoskeletal:        General: No swelling.  Skin:    General: Skin is warm.  Neurological:     General: No focal deficit present.     Mental Status: She is alert and oriented to person, place, and time. Mental status is at baseline.  Psychiatric:        Mood and Affect: Mood normal.        Thought Content: Thought content normal.        Judgment: Judgment normal.      Assessment and Plan: Diagnoses and all orders for this visit:  Symptomatic cholelithiasis    Erica Ross is a 48 y.o. female   1.  We will proceed to the OR for a lap cholecystectomy. 2. All risks and benefits were discussed with the patient to generally include: infection, bleeding, possible need for post op ERCP, damage to the bile ducts, and bile leak. Alternatives were offered and described.  All questions were answered and the patient voiced understanding of the procedure and wishes to proceed at this point with a laparoscopic cholecystectomy      No follow-ups on file.  Lynda Leos, MD, [facility], [place] [facility]

## 2024-06-15 ENCOUNTER — Other Ambulatory Visit: Payer: Self-pay

## 2024-06-15 ENCOUNTER — Emergency Department (HOSPITAL_COMMUNITY)
Admission: EM | Admit: 2024-06-15 | Discharge: 2024-06-15 | Disposition: A | Discharge status: Home / Self Care | Attending: Emergency Medicine | Admitting: Emergency Medicine

## 2024-06-15 ENCOUNTER — Emergency Department (HOSPITAL_COMMUNITY)

## 2024-06-15 DIAGNOSIS — M2550 Pain in unspecified joint: Secondary | ICD-10-CM | POA: Diagnosis not present

## 2024-06-15 DIAGNOSIS — K580 Irritable bowel syndrome with diarrhea: Secondary | ICD-10-CM | POA: Diagnosis not present

## 2024-06-15 DIAGNOSIS — K8050 Calculus of bile duct without cholangitis or cholecystitis without obstruction: Secondary | ICD-10-CM | POA: Diagnosis not present

## 2024-06-15 DIAGNOSIS — G4700 Insomnia, unspecified: Secondary | ICD-10-CM | POA: Diagnosis not present

## 2024-06-15 DIAGNOSIS — R109 Unspecified abdominal pain: Secondary | ICD-10-CM | POA: Diagnosis not present

## 2024-06-15 DIAGNOSIS — K8070 Calculus of gallbladder and bile duct without cholecystitis without obstruction: Secondary | ICD-10-CM | POA: Diagnosis not present

## 2024-06-15 DIAGNOSIS — K8020 Calculus of gallbladder without cholecystitis without obstruction: Secondary | ICD-10-CM | POA: Diagnosis not present

## 2024-06-15 DIAGNOSIS — R519 Headache, unspecified: Secondary | ICD-10-CM | POA: Diagnosis not present

## 2024-06-15 LAB — URINALYSIS, ROUTINE W REFLEX MICROSCOPIC
Bacteria, UA: NONE SEEN
Bilirubin Urine: NEGATIVE
Glucose, UA: NEGATIVE mg/dL
Ketones, ur: NEGATIVE mg/dL
Leukocytes,Ua: NEGATIVE
Nitrite: NEGATIVE
Protein, ur: NEGATIVE mg/dL
Specific Gravity, Urine: 1.003 — ABNORMAL LOW (ref 1.005–1.030)
pH: 8 (ref 5.0–8.0)

## 2024-06-15 LAB — CBC
HCT: 44.3 % (ref 36.0–46.0)
Hemoglobin: 14.5 g/dL (ref 12.0–15.0)
MCH: 30.9 pg (ref 26.0–34.0)
MCHC: 32.7 g/dL (ref 30.0–36.0)
MCV: 94.5 fL (ref 80.0–100.0)
Platelets: 250 K/uL (ref 150–400)
RBC: 4.69 MIL/uL (ref 3.87–5.11)
RDW: 13.4 % (ref 11.5–15.5)
WBC: 5.2 K/uL (ref 4.0–10.5)
nRBC: 0 % (ref 0.0–0.2)

## 2024-06-15 LAB — COMPREHENSIVE METABOLIC PANEL WITH GFR
ALT: 11 U/L (ref 0–44)
AST: 16 U/L (ref 15–41)
Albumin: 3.8 g/dL (ref 3.5–5.0)
Alkaline Phosphatase: 54 U/L (ref 38–126)
Anion gap: 10 (ref 5–15)
BUN: 10 mg/dL (ref 6–20)
CO2: 26 mmol/L (ref 22–32)
Calcium: 8.9 mg/dL (ref 8.9–10.3)
Chloride: 103 mmol/L (ref 98–111)
Creatinine, Ser: 0.66 mg/dL (ref 0.44–1.00)
GFR, Estimated: >60 mL/min (ref >60)
Glucose, Bld: 112 mg/dL — ABNORMAL HIGH (ref 70–99)
Potassium: 4.3 mmol/L (ref 3.5–5.1)
Sodium: 139 mmol/L (ref 135–145)
Total Bilirubin: 0.5 mg/dL (ref 0.0–1.2)
Total Protein: 7.1 g/dL (ref 6.5–8.1)

## 2024-06-15 LAB — LIPASE, BLOOD: Lipase: 26 U/L (ref 11–51)

## 2024-06-15 LAB — HCG, SERUM, QUALITATIVE: Preg, Serum: NEGATIVE

## 2024-06-15 MED ORDER — HYDROMORPHONE HCL 1 MG/ML IJ SOLN
0.5000 mg | Freq: Once | INTRAMUSCULAR | Status: DC
  Filled 2024-06-15: qty 1

## 2024-06-15 MED ORDER — ONDANSETRON HCL 4 MG PO TABS: 4.0000 mg | ORAL_TABLET | Freq: Four times a day (QID) | ORAL | 0 refills | Status: AC | PRN

## 2024-06-15 MED ORDER — ONDANSETRON HCL 4 MG/2ML IJ SOLN
4.0000 mg | Freq: Once | INTRAMUSCULAR | Status: DC
  Filled 2024-06-15: qty 2

## 2024-06-15 MED ORDER — LACTATED RINGERS IV BOLUS
1000.0000 mL | Freq: Once | INTRAVENOUS | Status: AC
  Administered 2024-06-15: 1000 mL via INTRAVENOUS

## 2024-06-15 MED ORDER — TRAMADOL HCL 50 MG PO TABS: 50.0000 mg | ORAL_TABLET | Freq: Four times a day (QID) | ORAL | 0 refills | Status: AC | PRN

## 2024-06-15 NOTE — Discharge Instructions (Signed)
 It was our pleasure to provide your ER care today - we hope that you feel better.  Follow up closely with general surgery as discussed with them.   Take acetaminophen  or ibuprofen as need for pain. You may also take ultram  as need for pain - no driving when taking.   Return to ER if worse, new symptoms, fevers, new or worsening or severe abdominal pain, persistent vomiting, or other concern.   You were given pain meds in the ER - no driving for the next 6 hours.

## 2024-06-15 NOTE — Progress Notes (Signed)
 Subjective: Patient known to [facility] as she was seen in the office about 1.5 weeks ago by Dr. Rubin.  The patient has been having biliary colic for the last 10 months or so, but this has worsened in the last month or so.  She has a large 2.5cm gallstone.  She is set up for OR on 11/11.  Last night she had a stronger pain that usual, but this has subsided.  She continues to have some daily nausea, but is still able to eat and has no vomiting.  She does have diarrhea at times.  No fevers, CP, SOB, etc.  Due to these persistent symptoms she presented to the ED to make sure she didn't have any acute infection.  Here, her labs are unremarkable and her US  reveals gallstones, but no evidence of cholecystitis.  We have been asked to see her.  ROS: See above, otherwise other systems negative  Objective: Vital signs in last 24 hours: Temp:  [97.4 F (36.3 C)-98.2 F (36.8 C)] 97.4 F (36.3 C) (10/28 1434) Pulse Rate:  [65-79] 65 (10/28 1434) Resp:  [14-18] 14 (10/28 1434) BP: (110-138)/(58-84) 110/58 (10/28 1434) SpO2:  [100 %] 100 % (10/28 1434) Weight:  [70.3 kg] 70.3 kg (10/28 1009)    Intake/Output from previous day: No intake/output data recorded. Intake/Output this shift: Total I/O In: 1000.7 [IV Piggyback:1000.7] Out: -   PE: Gen: NAD Heart: regular Lungs: respiratory effort unlabored Abd: soft, NT, ND, previous scars noted from prior abdominal surgery  Lab Results:  Recent Labs    06/15/24 1042  WBC 5.2  HGB 14.5  HCT 44.3  PLT 250   BMET Recent Labs    06/15/24 1042  NA 139  K 4.3  CL 103  CO2 26  GLUCOSE 112*  BUN 10  CREATININE 0.66  CALCIUM 8.9   PT/INR No results for input(s): LABPROT, INR in the last 72 hours. CMP     Component Value Date/Time   NA 139 06/15/2024 1042   NA 137 07/24/2020 1553   K 4.3 06/15/2024 1042   CL 103 06/15/2024 1042   CO2 26 06/15/2024 1042   GLUCOSE 112 (H) 06/15/2024 1042   BUN 10 06/15/2024 1042   BUN 13  07/24/2020 1553   CREATININE 0.66 06/15/2024 1042   CALCIUM 8.9 06/15/2024 1042   PROT 7.1 06/15/2024 1042   PROT 7.4 07/24/2020 1553   ALBUMIN  3.8 06/15/2024 1042   ALBUMIN  4.4 07/24/2020 1553   AST 16 06/15/2024 1042   ALT 11 06/15/2024 1042   ALKPHOS 54 06/15/2024 1042   BILITOT 0.5 06/15/2024 1042   BILITOT <0.2 07/24/2020 1553   GFRNONAA >60 06/15/2024 1042   GFRAA 127 07/24/2020 1553   Lipase     Component Value Date/Time   LIPASE 26 06/15/2024 1042       Studies/Results: US  Abdomen Limited RUQ (LIVER/GB) Result Date: 06/15/2024 EXAM: Right Upper Quadrant Abdominal Ultrasound 06/15/2024 11:34:23 AM TECHNIQUE: Real-time ultrasonography of the right upper quadrant of the abdomen was performed. COMPARISON: Comparison to 11/03/2023. CLINICAL HISTORY: Abdominal pain. FINDINGS: LIVER: The liver demonstrates normal echogenicity. No intrahepatic biliary ductal dilatation. No evidence of mass. BILIARY SYSTEM: Gallstones are seen, with the largest measuring 2.5 cm. The common bile duct measures 3 mm. No pericholecystic fluid or wall thickening. Negative sonographic Murphy's sign. OTHER: No right upper quadrant ascites. IMPRESSION: 1. Cholelithiasis. No evidence of cholecystitis or biliary ductal dilatation. Electronically signed by: Norleen Kil MD 06/15/2024 12:02  PM EDT RP Workstation: [id number]    Anti-infectives: Anti-infectives (From admission, onward)    None        Assessment/Plan Biliary colic The patient continues to have intermittent, but persistent biliary colic.  She has no evidence of cholecystitis with normal labs.  I offered admission for lap chole, but made her aware that tomorrow afternoon may be the earliest we could proceed vs possibly Thursday.  She would like to avoid admission given she is feeling better and see if she can continue to work on a low fat diet and wait til her scheduled surgery.  We discussed if her symptoms returned and persisted, she had  fevers, or worsening pain that was concerning she should return.  I have sent her a script for Zofran  as well as tramadol  both PRN in the interim.  She was otherwise stable to DC from the ED.   I reviewed nursing notes, ED provider notes, last 24 h vitals and pain scores, last 24 h labs and trends, and last 24 h imaging results.   LOS: 0 days    Burnard FORBES Banter , [facility] 06/15/2024, 2:38 PM Please see Amion for pager number during day hours 7:00am-4:30pm or 7:00am -11:30am on weekends

## 2024-06-15 NOTE — ED Triage Notes (Addendum)
 Pt c/o back intermittent abdominal pain with nausea and diarrhea. Reports has a surgery scheduled for gall bladder removal. Pain increasing, called DR this morning and advised to be checked out.

## 2024-06-15 NOTE — ED Provider Notes (Signed)
 [place] EMERGENCY DEPARTMENT AT [facility] Provider Note   CSN: [id number] Arrival date & time: 06/15/24  1002     Patient presents with: Abdominal Pain   Erica Ross is a 48 y.o. female.   Pt c/o upper abdominal pain intermittently in past 10-11 months. Indicates was diagnosed w gallstones, biliary colic. Indicates in past month pain is more frequent, almost daily. Dull pain, upper abdomen, occasionally radiating to back. At times, worse after eating. Nausea. No vomiting. Having normal bms. No dysuria or gu c/o. No lower abd or pelvic pain. No vaginal discharge or bleeding. No fever or chills. No cough or uri symptoms. No chest pain or sob.  Has tentative plan for gallbladder surgery mid November, Dr Rubin.   The history is provided by the patient and medical records.  Abdominal Pain Associated symptoms: nausea   Associated symptoms: no chest pain, no chills, no cough, no dysuria, no fever, no hematuria, no shortness of breath, no sore throat and no vomiting        Prior to Admission medications   Medication Sig Start Date End Date Taking? Authorizing Provider  magnesium gluconate (MAGONATE) 500 MG tablet Take 500 mg by mouth 2 (two) times daily.    [provider]  Omega-3 Fatty Acids (FISH OIL ODOR-LESS PO) Take 1,500 mg by mouth daily at 12 noon.    [provider]  Probiotic Product (PROBIOTIC DAILY PO) Take by mouth.    [provider]    Allergies: Watermelon [citrullus vulgaris] and Amoxicillin    Review of Systems  Constitutional:  Negative for chills and fever.  HENT:  Negative for sore throat.   Respiratory:  Negative for cough and shortness of breath.   Cardiovascular:  Negative for chest pain and leg swelling.  Gastrointestinal:  Positive for abdominal pain and nausea. Negative for vomiting.  Genitourinary:  Negative for dysuria, flank pain and hematuria.  Musculoskeletal:  Negative for neck pain.  Neurological:   Negative for headaches.    Updated Vital Signs BP 113/70   Pulse 69   Temp 98.2 F (36.8 C) (Oral)   Resp 14   Ht 1.6 m (5' 3)   Wt 70.3 kg   LMP 05/12/2024   SpO2 100%   BMI 27.45 kg/m   Physical Exam Vitals and nursing note reviewed.  Constitutional:      Appearance: Normal appearance. She is well-developed.  HENT:     Head: Atraumatic.     Nose: Nose normal.     Mouth/Throat:     Mouth: Mucous membranes are moist.  Eyes:     General: No scleral icterus.    Conjunctiva/sclera: Conjunctivae normal.  Neck:     Trachea: No tracheal deviation.  Cardiovascular:     Rate and Rhythm: Normal rate and regular rhythm.     Pulses: Normal pulses.     Heart sounds: Normal heart sounds. No murmur heard.    No friction rub. No gallop.  Pulmonary:     Effort: Pulmonary effort is normal. No respiratory distress.     Breath sounds: Normal breath sounds.  Abdominal:     General: Bowel sounds are normal. There is no distension.     Palpations: Abdomen is soft.     Tenderness: There is abdominal tenderness. There is no guarding.     Comments: Mild upper abd tenderness.  Genitourinary:    Comments: No cva tenderness.  Musculoskeletal:        General: No swelling  or tenderness.     Cervical back: Normal range of motion and neck supple. No rigidity. No muscular tenderness.  Skin:    General: Skin is warm and dry.     Findings: No rash.  Neurological:     Mental Status: She is alert.     Comments: Alert, speech normal.   Psychiatric:        Mood and Affect: Mood normal.     (all labs ordered are listed, but only abnormal results are displayed) Results for orders placed or performed during the hospital encounter of 06/15/24  Urinalysis, Routine w reflex microscopic -Urine, Clean Catch   Collection Time: 06/15/24 10:12 AM  Result Value Ref Range   Color, Urine STRAW (A) YELLOW   APPearance CLEAR CLEAR   Specific Gravity, Urine 1.003 (L) 1.005 - 1.030   pH 8.0 5.0 - 8.0    Glucose, UA NEGATIVE NEGATIVE mg/dL   Hgb urine dipstick SMALL (A) NEGATIVE   Bilirubin Urine NEGATIVE NEGATIVE   Ketones, ur NEGATIVE NEGATIVE mg/dL   Protein, ur NEGATIVE NEGATIVE mg/dL   Nitrite NEGATIVE NEGATIVE   Leukocytes,Ua NEGATIVE NEGATIVE   RBC / HPF 0-5 0 - 5 RBC/hpf   WBC, UA 0-5 0 - 5 WBC/hpf   Bacteria, UA NONE SEEN NONE SEEN   Squamous Epithelial / HPF 0-5 0 - 5 /HPF  Lipase, blood   Collection Time: 06/15/24 10:42 AM  Result Value Ref Range   Lipase 26 11 - 51 U/L  Comprehensive metabolic panel   Collection Time: 06/15/24 10:42 AM  Result Value Ref Range   Sodium 139 135 - 145 mmol/L   Potassium 4.3 3.5 - 5.1 mmol/L   Chloride 103 98 - 111 mmol/L   CO2 26 22 - 32 mmol/L   Glucose, Bld 112 (H) 70 - 99 mg/dL   BUN 10 6 - 20 mg/dL   Creatinine, Ser 9.33 0.44 - 1.00 mg/dL   Calcium 8.9 8.9 - 89.6 mg/dL   Total Protein 7.1 6.5 - 8.1 g/dL   Albumin  3.8 3.5 - 5.0 g/dL   AST 16 15 - 41 U/L   ALT 11 0 - 44 U/L   Alkaline Phosphatase 54 38 - 126 U/L   Total Bilirubin 0.5 0.0 - 1.2 mg/dL   GFR, Estimated >39 >39 mL/min   Anion gap 10 5 - 15  CBC   Collection Time: 06/15/24 10:42 AM  Result Value Ref Range   WBC 5.2 4.0 - 10.5 K/uL   RBC 4.69 3.87 - 5.11 MIL/uL   Hemoglobin 14.5 12.0 - 15.0 g/dL   HCT 55.6 63.9 - 53.9 %   MCV 94.5 80.0 - 100.0 fL   MCH 30.9 26.0 - 34.0 pg   MCHC 32.7 30.0 - 36.0 g/dL   RDW 86.5 88.4 - 84.4 %   Platelets 250 150 - 400 K/uL   nRBC 0.0 0.0 - 0.2 %  hCG, serum, qualitative   Collection Time: 06/15/24 10:42 AM  Result Value Ref Range   Preg, Serum NEGATIVE NEGATIVE     EKG: None  Radiology: US  Abdomen Limited RUQ (LIVER/GB) Result Date: 06/15/2024 EXAM: Right Upper Quadrant Abdominal Ultrasound 06/15/2024 11:34:23 AM TECHNIQUE: Real-time ultrasonography of the right upper quadrant of the abdomen was performed. COMPARISON: Comparison to 11/03/2023. CLINICAL HISTORY: Abdominal pain. FINDINGS: LIVER: The liver demonstrates  normal echogenicity. No intrahepatic biliary ductal dilatation. No evidence of mass. BILIARY SYSTEM: Gallstones are seen, with the largest measuring 2.5 cm. The common bile duct measures  3 mm. No pericholecystic fluid or wall thickening. Negative sonographic Murphy's sign. OTHER: No right upper quadrant ascites. IMPRESSION: 1. Cholelithiasis. No evidence of cholecystitis or biliary ductal dilatation. Electronically signed by: Norleen Kil MD 06/15/2024 12:02 PM EDT RP Workstation: [id number]     Procedures   Medications Ordered in the ED  HYDROmorphone  (DILAUDID ) injection 0.5 mg (0.5 mg Intravenous Patient Refused/Not Given 06/15/24 1142)  ondansetron  (ZOFRAN ) injection 4 mg (4 mg Intravenous Patient Refused/Not Given 06/15/24 1142)  lactated ringers  bolus 1,000 mL (0 mLs Intravenous Stopped 06/15/24 1331)                                    Medical Decision Making Problems Addressed: Biliary colic: acute illness or injury with systemic symptoms that poses a threat to life or bodily functions Gallstones: chronic illness or injury with exacerbation, progression, or side effects of treatment that poses a threat to life or bodily functions  Amount and/or Complexity of Data Reviewed Independent Historian:     Details: Fam/friend, hx External Data Reviewed: notes. Labs: ordered. Decision-making details documented in ED Course. Radiology: ordered and independent interpretation performed. Decision-making details documented in ED Course. Discussion of management or test interpretation with external provider(s): Gen surgery  Risk Prescription drug management. Parenteral controlled substances. Decision regarding hospitalization.   Iv ns. Continuous pulse ox and cardiac monitoring. Labs ordered/sent. Imaging ordered.   Differential diagnosis includes biliary colic, cholecystitis, other abd pain, etc. Dispo decision including potential need for admission considered - will get labs and imaging  and reassess.   Reviewed nursing notes and prior charts for additional history. External reports reviewed. Additional history from: family/friend.   Cardiac monitor: sinus rhythm, rate 78.  Dilaudid  iv. Zofran  iv. LR bolus.   Labs reviewed/interpreted by me - wbc and hgb normal. Ua neg for uti. Lfts and lipase normal.   U/S reviewed/interpreted by me - gallstones, no cholecystitis.   Pain improved but persists. Will consult general surgery re possibility of admission/cholecystectomy.   General surgery indicates pain currently improved and not able to do cholecystectomy in the next couple days - recommend d/c to home, they have sent in rx for pain and nausea meds and will f/u closely with patient.   Pt currently appears stable for ed d/c.   Return precautions provided.        Final diagnoses:  Gallstones  Biliary colic    ED Discharge Orders     None          Bernard Drivers, MD 06/15/24 1430

## 2024-06-15 NOTE — ED Notes (Signed)
 Pt provided d/c paperwork. Pt expressed understanding.

## 2024-06-21 NOTE — Progress Notes (Signed)
 Surgical Instructions   Your procedure is scheduled on Tuesday November 11. Report to [facility] Main Entrance A at 7:45 A.M., then check in with the Admitting office. Any questions or running late day of surgery: call [phone]  Questions prior to your surgery date: call [phone], Monday-Friday, 8am-4pm. If you experience any cold or flu symptoms such as cough, fever, chills, shortness of breath, etc. between now and your scheduled surgery, please notify us  at the above number.     Remember:  Do not eat after midnight the night before your surgery   You may drink clear liquids until 6:45am the morning of your surgery.   Clear liquids allowed are: Water , Non-Citrus Juices (without pulp), Carbonated Beverages, Clear Tea (no milk, honey, etc.), Black Coffee Only (NO MILK, CREAM OR POWDERED CREAMER of any kind), and Gatorade.  Patient Instructions  The night before surgery:  No food after midnight. ONLY clear liquids after midnight  The day of surgery (if you do NOT have diabetes):  Drink ONE (1) Pre-Surgery Clear Ensure by 6:45 am the morning of surgery. Drink in one sitting. Do not sip.  This drink was given to you during your hospital  pre-op appointment visit.  Nothing else to drink after completing the  Pre-Surgery Clear Ensure.        If you have questions, please contact your surgeon's office.    Take these medicines the morning of surgery with A SIP OF WATER   NP THYROID    May take these medicines IF NEEDED: traMADol  (ULTRAM )    One week prior to surgery, STOP taking any Aspirin  (unless otherwise instructed by your surgeon) Aleve, Naproxen, Ibuprofen, Motrin, Advil, Goody's, BC's, all herbal medications, fish oil, and non-prescription vitamins. This includes your Gallbladder Cleanse medication.                      Do NOT Smoke (Tobacco/Vaping) for 24 hours prior to your procedure.  If you use a CPAP at night, you may bring your mask/headgear for your  overnight stay.   You will be asked to remove any contacts, glasses, piercing's, hearing aid's, dentures/partials prior to surgery. Please bring cases for these items if needed.    Patients discharged the day of surgery will not be allowed to drive home, and someone needs to stay with them for 24 hours.  SURGICAL WAITING ROOM VISITATION Patients may have no more than 2 support people in the waiting area - these visitors may rotate.   Pre-op nurse will coordinate an appropriate time for 1 ADULT support person, who may not rotate, to accompany patient in pre-op.  Children under the age of 103 must have an adult with them who is not the patient and must remain in the main waiting area with an adult.  If the patient needs to stay at the hospital during part of their recovery, the visitor guidelines for inpatient rooms apply.  Please refer to the [facility] website for the visitor guidelines for any additional information.   If you received a COVID test during your pre-op visit  it is requested that you wear a mask when out in public, stay away from anyone that may not be feeling well and notify your surgeon if you develop symptoms. If you have been in contact with anyone that has tested positive in the last 10 days please notify you surgeon.      Pre-operative CHG Bathing Instructions   You can play a key role in reducing  the risk of infection after surgery. Your skin needs to be as free of germs as possible. You can reduce the number of germs on your skin by washing with CHG (chlorhexidine  gluconate) soap before surgery. CHG is an antiseptic soap that kills germs and continues to kill germs even after washing.   DO NOT use if you have an allergy to chlorhexidine /CHG or antibacterial soaps. If your skin becomes reddened or irritated, stop using the CHG and notify one of our RNs at [phone].              TAKE A SHOWER THE NIGHT BEFORE SURGERY   Please keep in mind the following:  DO NOT  shave, including legs and underarms, 48 hours prior to surgery.   You may shave your face before/day of surgery.  Place clean sheets on your bed the night before surgery Use a clean washcloth (not used since being washed) for shower. DO NOT sleep with pet's night before surgery.  CHG Shower Instructions:  Wash your face and private area with normal soap. If you choose to wash your hair, wash first with your normal shampoo.  After you use shampoo/soap, rinse your hair and body thoroughly to remove shampoo/soap residue.  Turn the water  OFF and apply half the bottle of CHG soap to a CLEAN washcloth.  Apply CHG soap ONLY FROM YOUR NECK DOWN TO YOUR TOES (washing for 3-5 minutes)  DO NOT use CHG soap on face, private areas, open wounds, or sores.  Pay special attention to the area where your surgery is being performed.  If you are having back surgery, having someone wash your back for you may be helpful. Wait 2 minutes after CHG soap is applied, then you may rinse off the CHG soap.  Pat dry with a clean towel  Put on clean pajamas    Additional instructions for the day of surgery: If you choose, you may shower the morning of surgery with an antibacterial soap.  DO NOT APPLY any lotions, deodorants, cologne, or perfumes.   Do not wear jewelry or makeup Do not wear nail polish, gel polish, artificial nails, or any other type of covering on natural nails (fingers and toes) Do not bring valuables to the hospital. [facility] is not responsible for valuables/personal belongings. Put on clean/comfortable clothes.  Please brush your teeth.  Ask your nurse before applying any prescription medications to the skin.

## 2024-06-22 ENCOUNTER — Encounter (HOSPITAL_COMMUNITY)
Admission: RE | Admit: 2024-06-22 | Discharge: 2024-06-22 | Disposition: A | Discharge status: Home / Self Care | Source: Ambulatory Visit | Attending: General Surgery | Admitting: General Surgery

## 2024-06-22 ENCOUNTER — Other Ambulatory Visit: Payer: Self-pay

## 2024-06-22 ENCOUNTER — Encounter (HOSPITAL_COMMUNITY): Payer: Self-pay

## 2024-06-22 ENCOUNTER — Telehealth: Payer: Self-pay | Admitting: Gastroenterology

## 2024-06-22 DIAGNOSIS — Z01818 Encounter for other preprocedural examination: Secondary | ICD-10-CM | POA: Diagnosis not present

## 2024-06-22 DIAGNOSIS — K529 Noninfective gastroenteritis and colitis, unspecified: Secondary | ICD-10-CM

## 2024-06-22 HISTORY — DX: Hypothyroidism, unspecified: E03.9

## 2024-06-22 HISTORY — DX: Anemia, unspecified: D64.9

## 2024-06-22 NOTE — Addendum Note (Signed)
 Addended by: Markeisha Mancias N on: 06/22/2024 03:48 PM   Modules accepted: Orders

## 2024-06-22 NOTE — Progress Notes (Signed)
 PCP - [name], FNP   Cardiologist -   PPM/ICD - denies Device Orders - n/a Rep Notified - n/a  Chest x-ray - denies EKG - denies Stress Test - denies ECHO - denies Cardiac Cath - denies  Sleep Study - denies CPAP - n/a  DM -denies  Blood Thinner Instructions:denies Aspirin  Instructions:denies  ERAS Protcol -clear liquids until 6:45 am.   COVID TEST- n/a   Anesthesia review: no  Patient denies shortness of breath, fever, cough and chest pain at PAT appointment   All instructions explained to the patient, with a verbal understanding of the material. Patient agrees to go over the instructions while at home for a better understanding. Patient also instructed to self quarantine after being tested for COVID-19. The opportunity to ask questions was provided.

## 2024-06-22 NOTE — Telephone Encounter (Signed)
 Called & spoke with patient. Informed patient that orders have been entered. Patient is coming to an appt with her husband on Friday and will have labs drawn. Patient states that she will pick up the stool kit but she is not sure when she will be able to actually submit the sample. I told patient to do her best in regard to collecting stool sample. Patient is aware that no appt is needed for lab work. Patient verbalized understanding & had no concerns at the end of the call.   Lab & stool study orders are in epic.

## 2024-06-22 NOTE — Telephone Encounter (Signed)
 Dr. San, I reviewed 04/2023 pathology results and you recommended ESR, CRP, and fecal calprotectin at that time. Do you want us  to order now since patient did have collected? Patient is scheduled in December for follow up. Please advise, thanks.

## 2024-06-22 NOTE — Telephone Encounter (Signed)
 Patient called stating that she was advised to have labs completed prior to 12/11 appointment. Patient is wishing for those orders to be submitted so she is able to come in on Friday to have labs completed. Please advise, thank you

## 2024-06-25 ENCOUNTER — Other Ambulatory Visit (HOSPITAL_COMMUNITY)

## 2024-06-25 ENCOUNTER — Other Ambulatory Visit (INDEPENDENT_AMBULATORY_CARE_PROVIDER_SITE_OTHER)

## 2024-06-25 DIAGNOSIS — K529 Noninfective gastroenteritis and colitis, unspecified: Secondary | ICD-10-CM | POA: Diagnosis not present

## 2024-06-25 LAB — SEDIMENTATION RATE: Sed Rate: 1 mm/h (ref 0–20)

## 2024-06-25 LAB — C-REACTIVE PROTEIN: CRP: <0.5 mg/dL (ref 0.5–20.0)

## 2024-06-28 ENCOUNTER — Ambulatory Visit: Payer: Self-pay | Admitting: Gastroenterology

## 2024-06-29 ENCOUNTER — Other Ambulatory Visit: Payer: Self-pay

## 2024-06-29 ENCOUNTER — Encounter (HOSPITAL_COMMUNITY): Payer: Self-pay | Admitting: General Surgery

## 2024-06-29 ENCOUNTER — Ambulatory Visit (HOSPITAL_COMMUNITY): Payer: Self-pay | Admitting: Anesthesiology

## 2024-06-29 ENCOUNTER — Other Ambulatory Visit

## 2024-06-29 ENCOUNTER — Ambulatory Visit (HOSPITAL_COMMUNITY)
Admission: RE | Admit: 2024-06-29 | Discharge: 2024-06-29 | Disposition: A | Discharge status: Home / Self Care | Attending: General Surgery | Admitting: General Surgery

## 2024-06-29 ENCOUNTER — Encounter (HOSPITAL_COMMUNITY)
Admission: RE | Disposition: A | Discharge status: Home / Self Care | Payer: Self-pay | Source: Home / Self Care | Attending: General Surgery

## 2024-06-29 DIAGNOSIS — R197 Diarrhea, unspecified: Secondary | ICD-10-CM | POA: Insufficient documentation

## 2024-06-29 DIAGNOSIS — E039 Hypothyroidism, unspecified: Secondary | ICD-10-CM | POA: Diagnosis not present

## 2024-06-29 DIAGNOSIS — K8010 Calculus of gallbladder with chronic cholecystitis without obstruction: Secondary | ICD-10-CM | POA: Insufficient documentation

## 2024-06-29 DIAGNOSIS — K529 Noninfective gastroenteritis and colitis, unspecified: Secondary | ICD-10-CM

## 2024-06-29 HISTORY — DX: Personal history of urinary calculi: Z87.442

## 2024-06-29 HISTORY — PX: CHOLECYSTECTOMY: SHX55

## 2024-06-29 LAB — POCT PREGNANCY, URINE: Preg Test, Ur: NEGATIVE

## 2024-06-29 SURGERY — LAPAROSCOPIC CHOLECYSTECTOMY
Anesthesia: General

## 2024-06-29 MED ORDER — OXYCODONE HCL 5 MG/5ML PO SOLN: 5.0000 mg | Freq: Once | ORAL | Status: AC | PRN

## 2024-06-29 MED ORDER — FENTANYL CITRATE (PF) 250 MCG/5ML IJ SOLN
INTRAMUSCULAR | Status: AC
  Filled 2024-06-29: qty 5

## 2024-06-29 MED ORDER — DEXMEDETOMIDINE HCL IN NACL 80 MCG/20ML IV SOLN
INTRAVENOUS | Status: DC | PRN
  Administered 2024-06-29: 8 ug via INTRAVENOUS
  Administered 2024-06-29: 4 ug via INTRAVENOUS

## 2024-06-29 MED ORDER — LACTATED RINGERS IV SOLN: INTRAVENOUS | Status: DC

## 2024-06-29 MED ORDER — ROCURONIUM BROMIDE 10 MG/ML (PF) SYRINGE
PREFILLED_SYRINGE | INTRAVENOUS | Status: DC | PRN
  Administered 2024-06-29: 60 mg via INTRAVENOUS

## 2024-06-29 MED ORDER — MIDAZOLAM HCL (PF) 2 MG/2ML IJ SOLN
INTRAMUSCULAR | Status: DC | PRN
  Administered 2024-06-29: 2 mg via INTRAVENOUS

## 2024-06-29 MED ORDER — PROPOFOL 10 MG/ML IV BOLUS
INTRAVENOUS | Status: DC | PRN
  Administered 2024-06-29: 150 mg via INTRAVENOUS

## 2024-06-29 MED ORDER — MIDAZOLAM HCL 2 MG/2ML IJ SOLN
INTRAMUSCULAR | Status: AC
  Filled 2024-06-29: qty 2

## 2024-06-29 MED ORDER — PROPOFOL 10 MG/ML IV BOLUS
INTRAVENOUS | Status: AC
  Filled 2024-06-29: qty 20

## 2024-06-29 MED ORDER — FENTANYL CITRATE (PF) 100 MCG/2ML IJ SOLN
25.0000 ug | INTRAMUSCULAR | Status: DC | PRN
  Administered 2024-06-29 (×2): 50 ug via INTRAVENOUS

## 2024-06-29 MED ORDER — SCOPOLAMINE 1 MG/3DAYS TD PT72
1.0000 | MEDICATED_PATCH | TRANSDERMAL | Status: DC
  Administered 2024-06-29: 1 mg via TRANSDERMAL
  Filled 2024-06-29: qty 1

## 2024-06-29 MED ORDER — DEXAMETHASONE SOD PHOSPHATE PF 10 MG/ML IJ SOLN
INTRAMUSCULAR | Status: DC | PRN
  Administered 2024-06-29: 10 mg via INTRAVENOUS

## 2024-06-29 MED ORDER — LIDOCAINE 2% (20 MG/ML) 5 ML SYRINGE
INTRAMUSCULAR | Status: DC | PRN
  Administered 2024-06-29: 80 mg via INTRAVENOUS

## 2024-06-29 MED ORDER — FENTANYL CITRATE (PF) 100 MCG/2ML IJ SOLN
INTRAMUSCULAR | Status: AC
  Filled 2024-06-29: qty 2

## 2024-06-29 MED ORDER — ACETAMINOPHEN 500 MG PO TABS: 1000.0000 mg | ORAL_TABLET | Freq: Once | ORAL | Status: AC

## 2024-06-29 MED ORDER — OXYCODONE HCL 5 MG PO TABS
5.0000 mg | ORAL_TABLET | Freq: Once | ORAL | Status: AC | PRN
  Administered 2024-06-29: 5 mg via ORAL

## 2024-06-29 MED ORDER — CELECOXIB 200 MG PO CAPS
200.0000 mg | ORAL_CAPSULE | Freq: Once | ORAL | Status: AC
  Administered 2024-06-29: 200 mg via ORAL
  Filled 2024-06-29: qty 1

## 2024-06-29 MED ORDER — ACETAMINOPHEN 500 MG PO TABS
1000.0000 mg | ORAL_TABLET | ORAL | Status: AC
  Administered 2024-06-29: 1000 mg via ORAL
  Filled 2024-06-29: qty 2

## 2024-06-29 MED ORDER — TRAMADOL HCL 50 MG PO TABS: 50.0000 mg | ORAL_TABLET | Freq: Four times a day (QID) | ORAL | 0 refills | Status: AC | PRN

## 2024-06-29 MED ORDER — ORAL CARE MOUTH RINSE: 15.0000 mL | Freq: Once | OROMUCOSAL | Status: AC

## 2024-06-29 MED ORDER — MEPERIDINE HCL 25 MG/ML IJ SOLN: 6.2500 mg | INTRAMUSCULAR | Status: DC | PRN

## 2024-06-29 MED ORDER — ONDANSETRON HCL 4 MG/2ML IJ SOLN
INTRAMUSCULAR | Status: AC
  Filled 2024-06-29: qty 2

## 2024-06-29 MED ORDER — SUGAMMADEX SODIUM 200 MG/2ML IV SOLN
INTRAVENOUS | Status: DC | PRN
  Administered 2024-06-29: 200 mg via INTRAVENOUS

## 2024-06-29 MED ORDER — CEFAZOLIN SODIUM-DEXTROSE 2-4 GM/100ML-% IV SOLN
2.0000 g | INTRAVENOUS | Status: AC
  Administered 2024-06-29: 2 g via INTRAVENOUS
  Filled 2024-06-29: qty 100

## 2024-06-29 MED ORDER — CHLORHEXIDINE GLUCONATE CLOTH 2 % EX PADS: 6.0000 | MEDICATED_PAD | Freq: Once | CUTANEOUS | Status: DC

## 2024-06-29 MED ORDER — BUPIVACAINE-EPINEPHRINE (PF) 0.25% -1:200000 IJ SOLN
INTRAMUSCULAR | Status: AC
  Filled 2024-06-29: qty 30

## 2024-06-29 MED ORDER — PROPOFOL 10 MG/ML IV BOLUS
INTRAVENOUS | Status: AC
Start: 2024-06-29 — End: 2024-06-29
  Filled 2024-06-29: qty 20

## 2024-06-29 MED ORDER — CHLORHEXIDINE GLUCONATE 0.12 % MT SOLN
15.0000 mL | Freq: Once | OROMUCOSAL | Status: AC
  Administered 2024-06-29: 15 mL via OROMUCOSAL
  Filled 2024-06-29: qty 15

## 2024-06-29 MED ORDER — ONDANSETRON HCL 4 MG/2ML IJ SOLN: 4.0000 mg | Freq: Once | INTRAMUSCULAR | Status: DC | PRN

## 2024-06-29 MED ORDER — LIDOCAINE 2% (20 MG/ML) 5 ML SYRINGE
INTRAMUSCULAR | Status: AC
  Filled 2024-06-29: qty 5

## 2024-06-29 MED ORDER — ENSURE PRE-SURGERY PO LIQD: 296.0000 mL | Freq: Once | ORAL | Status: DC

## 2024-06-29 MED ORDER — BUPIVACAINE HCL 0.25 % IJ SOLN
INTRAMUSCULAR | Status: DC | PRN
  Administered 2024-06-29: 8 mL

## 2024-06-29 MED ORDER — ONDANSETRON HCL 4 MG/2ML IJ SOLN
INTRAMUSCULAR | Status: DC | PRN
  Administered 2024-06-29: 4 mg via INTRAVENOUS

## 2024-06-29 MED ORDER — OXYCODONE HCL 5 MG PO TABS
ORAL_TABLET | ORAL | Status: AC
  Filled 2024-06-29: qty 1

## 2024-06-29 MED ORDER — ROCURONIUM BROMIDE 10 MG/ML (PF) SYRINGE
PREFILLED_SYRINGE | INTRAVENOUS | Status: AC
  Filled 2024-06-29: qty 10

## 2024-06-29 MED ORDER — FENTANYL CITRATE (PF) 250 MCG/5ML IJ SOLN
INTRAMUSCULAR | Status: DC | PRN
  Administered 2024-06-29 (×2): 50 ug via INTRAVENOUS
  Administered 2024-06-29: 100 ug via INTRAVENOUS

## 2024-06-29 SURGICAL SUPPLY — 35 items
BAG COUNTER SPONGE SURGICOUNT (BAG) ×1 IMPLANT
CANISTER SUCTION 3000ML PPV (SUCTIONS) ×1 IMPLANT
CHLORAPREP W/TINT 26 (MISCELLANEOUS) ×1 IMPLANT
CLIP LIGATING HEMO O LOK GREEN (MISCELLANEOUS) ×1 IMPLANT
COVER SURGICAL LIGHT HANDLE (MISCELLANEOUS) ×1 IMPLANT
COVER TRANSDUCER ULTRASND (DRAPES) ×1 IMPLANT
DERMABOND ADVANCED .7 DNX12 (GAUZE/BANDAGES/DRESSINGS) ×1 IMPLANT
ELECTRODE REM PT RTRN 9FT ADLT (ELECTROSURGICAL) ×1 IMPLANT
ENDOLOOP SUT PDS II 0 18 (SUTURE) IMPLANT
GLOVE BIO SURGEON STRL SZ7.5 (GLOVE) ×2 IMPLANT
GOWN STRL REUS W/ TWL LRG LVL3 (GOWN DISPOSABLE) ×2 IMPLANT
GOWN STRL REUS W/ TWL XL LVL3 (GOWN DISPOSABLE) ×1 IMPLANT
GRASPER SUT TROCAR 14GX15 (MISCELLANEOUS) ×1 IMPLANT
IRRIGATION SUCT STRKRFLW 2 WTP (MISCELLANEOUS) ×1 IMPLANT
KIT BASIN OR (CUSTOM PROCEDURE TRAY) ×1 IMPLANT
KIT IMAGING PINPOINTPAQ (MISCELLANEOUS) IMPLANT
KIT TURNOVER KIT B (KITS) ×1 IMPLANT
NDL INSUFFLATION 14GA 120MM (NEEDLE) ×1 IMPLANT
NEEDLE INSUFFLATION 14GA 120MM (NEEDLE) ×1 IMPLANT
PAD ARMBOARD POSITIONER FOAM (MISCELLANEOUS) ×1 IMPLANT
POUCH LAPAROSCOPIC INSTRUMENT (MISCELLANEOUS) ×1 IMPLANT
POUCH RETRIEVAL ECOSAC 10 (ENDOMECHANICALS) IMPLANT
SCISSORS LAP 5X35 DISP (ENDOMECHANICALS) ×1 IMPLANT
SET TUBE SMOKE EVAC HIGH FLOW (TUBING) ×1 IMPLANT
SLEEVE Z-THREAD 5X100MM (TROCAR) ×1 IMPLANT
SOLN 0.9% NACL POUR BTL 1000ML (IV SOLUTION) ×1 IMPLANT
SOLN STERILE WATER BTL 1000 ML (IV SOLUTION) ×1 IMPLANT
SUT MNCRL AB 4-0 PS2 18 (SUTURE) ×1 IMPLANT
SUT VICRYL 0 UR6 27IN ABS (SUTURE) IMPLANT
TOWEL GREEN STERILE (TOWEL DISPOSABLE) ×1 IMPLANT
TOWEL GREEN STERILE FF (TOWEL DISPOSABLE) ×1 IMPLANT
TRAY LAPAROSCOPIC MC (CUSTOM PROCEDURE TRAY) ×1 IMPLANT
TROCAR 11X100 Z THREAD (TROCAR) ×1 IMPLANT
TROCAR Z-THREAD OPTICAL 5X100M (TROCAR) ×1 IMPLANT
WARMER LAPAROSCOPE (MISCELLANEOUS) ×1 IMPLANT

## 2024-06-29 NOTE — Anesthesia Procedure Notes (Signed)
 Procedure Name: Intubation Date/Time: 06/29/2024 9:51 AM  Performed by: Loreli Blima LABOR, CRNAPre-anesthesia Checklist: Patient identified, Emergency Drugs available, Suction available and Patient being monitored Patient Re-evaluated:Patient Re-evaluated prior to induction Oxygen Delivery Method: Circle System Utilized Preoxygenation: Pre-oxygenation with 100% oxygen Induction Type: IV induction Ventilation: Mask ventilation without difficulty Laryngoscope Size: Mac and 3 Grade View: Grade I Tube type: Oral Tube size: 7.5 mm Number of attempts: 1 Airway Equipment and Method: Stylet Placement Confirmation: ETT inserted through vocal cords under direct vision, positive ETCO2 and breath sounds checked- equal and bilateral Secured at: 22 cm Tube secured with: Tape Dental Injury: Teeth and Oropharynx as per pre-operative assessment

## 2024-06-29 NOTE — Transfer of Care (Signed)
 Immediate Anesthesia Transfer of Care Note  Patient: Erica Ross  Procedure(s) Performed: LAPAROSCOPIC CHOLECYSTECTOMY  Patient Location: PACU  Anesthesia Type:General  Level of Consciousness: drowsy  Airway & Oxygen Therapy: Patient Spontanous Breathing and Patient connected to face mask oxygen  Post-op Assessment: Report given to RN and Post -op Vital signs reviewed and stable  Post vital signs: Reviewed and stable  Last Vitals:  Vitals Value Taken Time  BP 127/70 06/29/24 10:47  Temp    Pulse 91 06/29/24 10:49  Resp 15 06/29/24 10:49  SpO2 100 % 06/29/24 10:49  Vitals shown include unfiled device data.  Last Pain:  Vitals:   06/29/24 0831  TempSrc:   PainSc: 0-No pain      Patients Stated Pain Goal: 0 (06/29/24 0819)  Complications: No notable events documented.

## 2024-06-29 NOTE — Anesthesia Preprocedure Evaluation (Addendum)
 Anesthesia Evaluation  Patient identified by MRN, date of birth, ID band Patient awake    Reviewed: Allergy & Precautions, H&P , NPO status , Patient's Chart, lab work & pertinent test results  Airway Mallampati: II  TM Distance: >3 FB Neck ROM: Full    Dental  (+) Dental Advisory Given, Caps, Teeth Intact   Pulmonary neg pulmonary ROS   breath sounds clear to auscultation       Cardiovascular Exercise Tolerance: Good negative cardio ROS  Rhythm:Regular Rate:Normal     Neuro/Psych   Anxiety     negative neurological ROS  negative psych ROS   GI/Hepatic Neg liver ROS,GERD  ,,Abdominal mass   Endo/Other  Hypothyroidism    Renal/GU Renal disease  negative genitourinary   Musculoskeletal   Abdominal   Peds  Hematology  (+) Blood dyscrasia, anemia   Anesthesia Other Findings   Reproductive/Obstetrics negative OB ROS                              Lab Results  Component Value Date   WBC 5.2 06/15/2024   HGB 14.5 06/15/2024   HCT 44.3 06/15/2024   MCV 94.5 06/15/2024   PLT 250 06/15/2024   Lab Results  Component Value Date   CREATININE 0.66 06/15/2024   BUN 10 06/15/2024   NA 139 06/15/2024   K 4.3 06/15/2024   CL 103 06/15/2024   CO2 26 06/15/2024    Anesthesia Physical Anesthesia Plan  ASA: 2  Anesthesia Plan: General   Post-op Pain Management: Tylenol  PO (pre-op)*, Celebrex  PO (pre-op)* and Minimal or no pain anticipated   Induction: Intravenous and Cricoid pressure planned  PONV Risk Score and Plan: 4 or greater and Scopolamine  patch - Pre-op, Dexamethasone , Ondansetron  and Treatment may vary due to age or medical condition  Airway Management Planned: Oral ETT  Additional Equipment: None  Intra-op Plan:   Post-operative Plan: Extubation in OR  Informed Consent: I have reviewed the patients History and Physical, chart, labs and discussed the procedure including the  risks, benefits and alternatives for the proposed anesthesia with the patient or authorized representative who has indicated his/her understanding and acceptance.     Dental advisory given  Plan Discussed with: CRNA and Anesthesiologist  Anesthesia Plan Comments:          Anesthesia Quick Evaluation

## 2024-06-29 NOTE — Interval H&P Note (Signed)
 History and Physical Interval Note:  06/29/2024 9:05 AM  Erica Ross  has presented today for surgery, with the diagnosis of GALLSTONES.  The various methods of treatment have been discussed with the patient and family. After consideration of risks, benefits and other options for treatment, the patient has consented to  Procedure(s): LAPAROSCOPIC CHOLECYSTECTOMY (N/A) as a surgical intervention.  The patient's history has been reviewed, patient examined, no change in status, stable for surgery.  I have reviewed the patient's chart and labs.  Questions were answered to the patient's satisfaction.     Avian Greenawalt

## 2024-06-29 NOTE — Anesthesia Postprocedure Evaluation (Signed)
 Anesthesia Post Note  Patient: [name]  Procedure(s) Performed: LAPAROSCOPIC CHOLECYSTECTOMY     Patient location during evaluation: PACU Anesthesia Type: General Level of consciousness: awake and alert Pain management: pain level controlled Vital Signs Assessment: post-procedure vital signs reviewed and stable Respiratory status: spontaneous breathing, nonlabored ventilation, respiratory function stable and patient connected to nasal cannula oxygen Cardiovascular status: blood pressure returned to baseline and stable Postop Assessment: no apparent nausea or vomiting Anesthetic complications: no   No notable events documented.  Last Vitals:  Vitals:   06/29/24 1115 06/29/24 1130  BP: 121/68 127/71  Pulse: 75 65  Resp: (!) 9 (!) 9  Temp:  36.4 C  SpO2: 98% 100%    Last Pain:  Vitals:   06/29/24 1115  TempSrc:   PainSc: Asleep                 Rainna Nearhood

## 2024-06-29 NOTE — Op Note (Signed)
 06/29/2024  10:29 AM  PATIENT:  Erica Ross  48 y.o. female  PRE-OPERATIVE DIAGNOSIS:  GALLSTONES  POST-OPERATIVE DIAGNOSIS:  GALLSTONES  PROCEDURE:  Procedure(s): LAPAROSCOPIC CHOLECYSTECTOMY (N/A)  SURGEON:  Surgeons and Role:    DEWAINE Rubin Calamity, MD - Primary  ASSISTANTS: Waddell Collier, RNFA   ANESTHESIA:   local and general  EBL:  minimal   BLOOD ADMINISTERED:none  DRAINS: none   LOCAL MEDICATIONS USED:  BUPIVICAINE   SPECIMEN:  Source of Specimen:  gallbladder  DISPOSITION OF SPECIMEN:  PATHOLOGY  COUNTS:  YES  TOURNIQUET:  * No tourniquets in log *  DICTATION: .Dragon Dictation   EBL: <5cc   Complications: none   Counts: reported as correct x 2   Findings:chronic inflammation of the gallbladder, large gallstones  Indications for procedure: Pt is a 26F with RUQ pain and seen to have gallstones.   Details of the procedure: The patient was taken to the operating and placed in the supine position with bilateral SCDs in place. A time out was called and all facts were verified. A pneumoperitoneum was obtained via A Veress needle technique to a pressure of 14mm of mercury. A 5mm trochar was then placed in the right upper quadrant under visualization, and there were no injuries to any abdominal organs. A 11 mm port was then placed in the umbilical region after infiltrating with local anesthesia under direct visualization. A second epigastric port was placed under direct visualization.   The gallbladder was identified and retracted, the peritoneum was then sharply dissected from the gallbladder and this dissection was carried down to Calot's triangle. The cystic duct was identified and dissected circumferentially and seen going into the gallbladder 360.  The cystic artery was dissected away from the surrounding tissues.   The critical angle was obtained.    2 clips were placed proximally one distally and the cystic duct transected. The cystic artery was  identified and 2 clips placed proximally and one distally and transected. We then proceeded to remove the gallbladder off the hepatic fossa with Bovie cautery. A retrieval bag was then placed in the abdomen and gallbladder placed in the bag. The hepatic fossa was then reexamined and hemostasis was achieved with Bovie cautery and was excellent at this portion of the case. The subhepatic fossa and perihepatic fossa was then irrigated until the effluent was clear. The specimen bag and specimen were removed from the abdominal cavity.  The 11 mm trocar fascia was reapproximated with the Endo Close #1 Vicryl x2. The pneumoperitoneum was evacuated and all trochars removed under direct visulalization. The skin was then closed with 4-0 Monocryl and the skin dressed with Dermabond. The patient was awaken from general anesthesia and taken to the recovery room in stable condition.   PLAN OF CARE: Discharge to home after PACU  PATIENT DISPOSITION:  PACU - hemodynamically stable.   Delay start of Pharmacological VTE agent (>24hrs) due to surgical blood loss or risk of bleeding: not applicable

## 2024-06-29 NOTE — Discharge Instructions (Signed)
 [facility] ______[facility] LAPAROSCOPIC SURGERY: POST OP INSTRUCTIONS Always review your discharge instruction sheet given to you by the facility where your surgery was performed. IF YOU HAVE DISABILITY OR FAMILY LEAVE FORMS, YOU MUST BRING THEM TO THE OFFICE FOR PROCESSING.   DO NOT GIVE THEM TO YOUR DOCTOR.  A prescription for pain medication may be given to you upon discharge.  Take your pain medication as prescribed, if needed.  If narcotic pain medicine is not needed, then you may take acetaminophen  (Tylenol ) or ibuprofen  (Advil ) as needed. Take your usually prescribed medications unless otherwise directed. If you need a refill on your pain medication, please contact your pharmacy.  They will contact our office to request authorization. Prescriptions will not be filled after 5pm or on week-ends. You should follow a light diet the first few days after arrival home, such as soup and crackers, etc.  Be sure to include lots of fluids daily. Most patients will experience some swelling and bruising in the area of the incisions.  Ice packs will help.  Swelling and bruising can take several days to resolve.  It is common to experience some constipation if taking pain medication after surgery.  Increasing fluid intake and taking a stool softener (such as Colace) will usually help or prevent this problem from occurring.  A mild laxative (Milk of Magnesia or Miralax) should be taken according to package instructions if there are no bowel movements after 48 hours. Unless discharge instructions indicate otherwise, you may remove your bandages 24-48 hours after surgery, and you may shower at that time.  You may have steri-strips (small skin tapes) in place directly over the incision.  These strips should be left on the skin for 7-10 days.  If your surgeon used skin glue on the incision, you may shower in 24 hours.  The glue will flake off over the next 2-3 weeks.  Any sutures or staples will be  removed at the office during your follow-up visit. ACTIVITIES:  You may resume regular (light) daily activities beginning the next day--such as daily self-care, walking, climbing stairs--gradually increasing activities as tolerated.  You may have sexual intercourse when it is comfortable.  Refrain from any heavy lifting or straining until approved by your doctor. You may drive when you are no longer taking prescription pain medication, you can comfortably wear a seatbelt, and you can safely maneuver your car and apply brakes. RETURN TO WORK:  __________________________________________________________ Erica Ross should see your doctor in the office for a follow-up appointment approximately 2-3 weeks after your surgery.  Make sure that you call for this appointment within a day or two after you arrive home to insure a convenient appointment time. OTHER INSTRUCTIONS: __________________________________________________________________________________________________________________________ __________________________________________________________________________________________________________________________ WHEN TO CALL YOUR DOCTOR: Fever over 101.0 Inability to urinate Continued bleeding from incision. Increased pain, redness, or drainage from the incision. Increasing abdominal pain  The clinic staff is available to answer your questions during regular business hours.  Please don't hesitate to call and ask to speak to one of the nurses for clinical concerns.  If you have a medical emergency, go to the nearest emergency room or call 911.  A surgeon from [facility] is always on call at the hospital. [address], [address], [place], [place]  [address] ? [address] [address], [place], [place]   [address] [phone] ? [phone] ? FAX [phone] Web site: [email or web address]

## 2024-06-30 ENCOUNTER — Encounter (HOSPITAL_COMMUNITY): Payer: Self-pay | Admitting: General Surgery

## 2024-06-30 LAB — SURGICAL PATHOLOGY

## 2024-07-02 LAB — CALPROTECTIN, FECAL: Calprotectin, Fecal: <5 ug/g (ref 0–120)

## 2024-07-06 ENCOUNTER — Encounter: Payer: Self-pay | Admitting: Family

## 2024-07-06 ENCOUNTER — Ambulatory Visit: Admitting: Family

## 2024-07-06 ENCOUNTER — Ambulatory Visit: Payer: Self-pay

## 2024-07-06 VITALS — BP 118/79 | HR 93 | Temp 98.0°F | Ht 64.0 in | Wt 144.4 lb

## 2024-07-06 DIAGNOSIS — R21 Rash and other nonspecific skin eruption: Secondary | ICD-10-CM

## 2024-07-06 DIAGNOSIS — T7840XA Allergy, unspecified, initial encounter: Secondary | ICD-10-CM

## 2024-07-06 MED ORDER — HYDROXYZINE PAMOATE 25 MG PO CAPS: 25.0000 mg | ORAL_CAPSULE | Freq: Three times a day (TID) | ORAL | 0 refills | Status: AC | PRN

## 2024-07-06 MED ORDER — METHYLPREDNISOLONE ACETATE 80 MG/ML IJ SUSP
80.0000 mg | Freq: Once | INTRAMUSCULAR | Status: AC
  Administered 2024-07-06: 80 mg via INTRAMUSCULAR

## 2024-07-06 MED ORDER — PREDNISONE 10 MG (21) PO TBPK: ORAL_TABLET | ORAL | 0 refills | Status: DC

## 2024-07-06 NOTE — Telephone Encounter (Signed)
 Appt made.

## 2024-07-06 NOTE — Telephone Encounter (Signed)
 FYI Only or Action Required?: FYI only for provider: appointment scheduled on 07/06/2024.  Patient was last seen in primary care on 04/06/2024 by [name], FNP.  Called Nurse Triage reporting Pruritis.  Symptoms began several days ago.  Interventions attempted: OTC medications: Zyrtec  and Benadryl .  Symptoms are: gradually worsening.  Triage Disposition: See PCP When Office is Open (Within 3 Days)  Patient/caregiver understands and will follow disposition?: Yes        Copied from CRM #[id number]. Topic: Clinical - Red Word Triage >> Jul 06, 2024  9:49 AM Joesph NOVAK wrote: Red Word that prompted transfer to Nurse Triage:  Surgery last Tuesday, reaction to glue. Itches all over her abdomen really bad. Reason for Disposition  [1] MODERATE-SEVERE local itching (i.e., interferes with work, school, activities) AND [2] not improved after 24 hours of hydrocortisone cream  Answer Assessment - Initial Assessment Questions 1. DESCRIPTION: Describe the itching you are having. Where is it located?     All over abd area to sides and legs  2. SEVERITY: How bad is it?      Moderate  3. ONSET: When did the itching begin? (e.g., minutes, hours, days ago)     2-3 days after surgery on 11/11  4. CAUSE: What do you think is causing the itching?      Reaction to surgical glue  5. OTHER SYMPTOMS: Do you have any other symptoms? (e.g., fever, rash)     Rash    On zyrtec  and benadryl  for symptoms. Denies a fever or drainage from area.  Protocols used: Itching - Localized-A-AH

## 2024-07-06 NOTE — Progress Notes (Signed)
 Subjective:    Patient ID: Erica Ross, female    DOB: [birth year], 48 y.o.   MRN: [id number]  Chief Complaint  Patient presents with   Pruritis   Pt presents to the office today with rash on abdomen that she noticed 07/01/24. She had laparoscopic cholecystectomy on 06/29/24. She called her surgeon who thought she was having an allergic reaction to the glue.  Rash This is a new problem. The current episode started 1 to 4 weeks ago. The problem has been gradually worsening since onset. The affected locations include the abdomen. The rash is characterized by itchiness and pain.      Review of Systems  Skin:  Positive for rash.  All other systems reviewed and are negative.   Social History   Socioeconomic History   Marital status: Married    Spouse name: Not on file   Number of children: 3   Years of education: Not on file   Highest education level: Not on file  Occupational History   Not on file  Tobacco Use   Smoking status: Never   Smokeless tobacco: Never  Vaping Use   Vaping status: Never Used  Substance and Sexual Activity   Alcohol use: No    Alcohol/week: 0.0 standard drinks of alcohol   Drug use: No   Sexual activity: Yes  Other Topics Concern   Not on file  Social History Narrative   Not on file   Social Drivers of Health   Financial Resource Strain: Not on file  Food Insecurity: Not on file  Transportation Needs: Not on file  Physical Activity: Not on file  Stress: Not on file  Social Connections: Not on file   Family History  Problem Relation Age of Onset   Diabetes Mother    Aneurysm Father    Diabetes Brother    Colon polyps Paternal Grandmother    Breast cancer Paternal Grandmother    Breast cancer Other    Liver disease Neg Hx    Esophageal cancer Neg Hx    Colon cancer Neg Hx    Rectal cancer Neg Hx    Stomach cancer Neg Hx         Objective:   Physical Exam Vitals reviewed.  Constitutional:      General: She is not in acute  distress.    Appearance: She is well-developed.  HENT:     Head: Normocephalic and atraumatic.  Eyes:     Pupils: Pupils are equal, round, and reactive to light.  Neck:     Thyroid : No thyromegaly.  Cardiovascular:     Rate and Rhythm: Normal rate and regular rhythm.     Heart sounds: Normal heart sounds. No murmur heard. Pulmonary:     Effort: Pulmonary effort is normal. No respiratory distress.     Breath sounds: Normal breath sounds. No wheezing.  Abdominal:     General: Bowel sounds are normal. There is no distension.     Palpations: Abdomen is soft.     Tenderness: There is no abdominal tenderness.  Musculoskeletal:        General: No tenderness. Normal range of motion.     Cervical back: Normal range of motion and neck supple.  Skin:    General: Skin is warm and dry.     Findings: Rash present.     Comments: Erythemas hive like around all puncture site with papular rash extending up to her breast and down to her thighs   Neurological:  Mental Status: She is alert and oriented to person, place, and time.     Cranial Nerves: No cranial nerve deficit.     Deep Tendon Reflexes: Reflexes are normal and symmetric.  Psychiatric:        Behavior: Behavior normal.        Thought Content: Thought content normal.        Judgment: Judgment normal.       LMP 06/09/2024 (Approximate)      Assessment & Plan:  Erica Ross comes in today with chief complaint of Pruritis   Diagnosis and orders addressed:  1. Allergic reaction, initial encounter (Primary) Depo-Medrol given today Start prednisone  tomorrow Can continue zyrtec   Start Vistaril 25 mg every 8 hours Keep follow up with with surgeon  Follow up if any s/s of infection of fever, purulent discharge, or increased pain.  - methylPREDNISolone acetate (DEPO-MEDROL) injection 80 mg - predniSONE  (STERAPRED UNI-PAK 21 TAB) 10 MG (21) TBPK tablet; Use as directed  Dispense: 21 tablet; Refill: 0 - hydrOXYzine (VISTARIL)  25 MG capsule; Take 1 capsule (25 mg total) by mouth every 8 (eight) hours as needed.  Dispense: 30 capsule; Refill: 0     Bari Learn, FNP

## 2024-07-06 NOTE — Patient Instructions (Signed)
 Contact Dermatitis Dermatitis is redness, soreness, and swelling (inflammation) of the skin. Contact dermatitis is a reaction to certain substances that touch the skin. There are two types of this condition: Irritant contact dermatitis. This is the most common type. It happens when something irritates your skin, such as when your hands get dry from washing them too often with soap. You can get this type of reaction even if you have not been exposed to the irritant before. Allergic contact dermatitis. This type is caused by a substance that you are allergic to, such as poison ivy. It occurs when you have been exposed to the substance (allergen) and form a sensitivity to it. In some cases, the reaction may start soon after your first exposure to the allergen. In other cases, it may not start until you are exposed to the allergen again. It may then occur every time you are exposed to the allergen in the future. What are the causes? Irritant contact dermatitis is often caused by exposure to: Makeup. Soaps, detergents, and bleaches. Acids. Metal salts, such as nickel. Allergic contact dermatitis is often caused by exposure to: Poisonous plants. Chemicals. Jewelry. Latex. Medicines. Preservatives in products, such as clothes. What increases the risk? You are more likely to get this condition if you have: A job that exposes you to irritants or allergens. Certain medical conditions. These include asthma and eczema. What are the signs or symptoms? Symptoms of this condition may occur in any place on your body that has been touched by the irritant. Symptoms include: Dryness, flaking, or cracking. Redness. Itching. Pain or a burning feeling. Blisters. Drainage of small amounts of blood or clear fluid from skin cracks. With allergic contact dermatitis, there may also be swelling in areas such as the eyelids, mouth, or genitals. How is this diagnosed? This condition is diagnosed with a medical  history and physical exam. A patch skin test may be done to help figure out the cause. If the condition is related to your job, you may need to see an expert in health problems in the workplace (occupational medicine specialist). How is this treated? This condition is treated by staying away from the cause of the reaction and protecting your skin from further contact. Treatment may also include: Steroid creams or ointments. Steroid medicines may need be taken by mouth (orally) in more severe cases. Antibiotics or medicines applied to the skin to kill bacteria (antibacterial ointments). These may be needed if a skin infection is present. Antihistamines. These may be taken orally or put on as a lotion to ease itching. A bandage (dressing). Follow these instructions at home: Skin care Moisturize your skin as needed. Put cool, wet cloths (cool compresses) on the affected areas. Try applying baking soda paste to your skin. Stir water into baking soda until it has the consistency of a paste. Do not scratch your skin. Avoid friction to the affected area. Avoid the use of soaps, perfumes, and dyes. Check the affected areas every day for signs of infection. Check for: More redness, swelling, or pain. More fluid or blood. Warmth. Pus or a bad smell. Medicines Take or apply over-the-counter and prescription medicines only as told by your health care provider. If you were prescribed antibiotics, take or apply them as told by your health care provider. Do not stop using the antibiotic even if you start to feel better. Bathing Try taking a bath with: Epsom salts. Follow the instructions on the packaging. You can get these at your local pharmacy  or grocery store. Baking soda. Pour a small amount into the bath as told by your health care provider. Colloidal oatmeal. Follow the instructions on the packaging. You can get this at your local pharmacy or grocery store. Bathe less often. This may mean bathing  every other day. Bathe in lukewarm water. Avoid using hot water. Bandage care If you were given a dressing, change it as told by your health care provider. Wash your hands with soap and water for at least 20 seconds before and after you change your dressing. If soap and water are not available, use hand sanitizer. General instructions Avoid the substance that caused your reaction. If you do not know what caused it, keep a journal to try to track what caused it. Write down: What you eat and drink. What cosmetics you use. What you wear in the affected area. This includes jewelry. Contact a health care provider if: Your condition does not get better with treatment. Your condition gets worse. You have any signs of infection. You have a fever. You have new symptoms. Your bone or joint under the affected area becomes painful after the skin has healed. Get help right away if: You notice red streaks coming from the affected area. The affected area turns darker. You have trouble breathing. This information is not intended to replace advice given to you by your health care provider. Make sure you discuss any questions you have with your health care provider. Document Revised: 02/08/2022 Document Reviewed: 02/08/2022 Elsevier Patient Education  2024 [employer].

## 2024-07-21 ENCOUNTER — Telehealth: Payer: Self-pay | Admitting: Gastroenterology

## 2024-07-21 NOTE — Telephone Encounter (Signed)
 Goodmorning Dr.Cirigliano,  Patient calling wanting to know if her upcoming appointment on 07/29/24 at 11am can be virtually due to recently having gallbladder surgery and being out of work more than expected and unable to get off from work Please advise  Thank you

## 2024-07-29 ENCOUNTER — Telehealth: Admitting: Gastroenterology

## 2024-07-29 VITALS — Ht 64.0 in

## 2024-07-29 DIAGNOSIS — Z85831 Personal history of malignant neoplasm of soft tissue: Secondary | ICD-10-CM | POA: Diagnosis not present

## 2024-07-29 DIAGNOSIS — R194 Change in bowel habit: Secondary | ICD-10-CM

## 2024-07-29 DIAGNOSIS — R1084 Generalized abdominal pain: Secondary | ICD-10-CM

## 2024-07-29 DIAGNOSIS — C499 Malignant neoplasm of connective and soft tissue, unspecified: Secondary | ICD-10-CM

## 2024-07-29 NOTE — Progress Notes (Signed)
 Chief Complaint: Digestive issues   HPI:    Per patient request, in office visit was converted to a telehealth virtual consultation using MyChart video.  -Time of medical discussion: 20 minutes -The patient did consent to this virtual visit and is aware of possible charges through their insurance for this visit.  -Names of all parties present: Erica Ross (patient), Erica Flatter, DO, Erica Ross (physician) -Patient location: Home -Physician location: Office  Erica Ross is a 48 y.o. female referred to the Gastroenterology Clinic for evaluation of intermittent digestive issues starting 5-6 months ago.  She describes intermittent loose stools, but not frank diarrhea.  Can have generalized abdominal discomfort, but otherwise no hematochezia, melena.  She had attributed much of the symptoms to her gallbladder and has since had a cholecystectomy last month as outlined below.  Since surgery, has some expected postoperative soreness, but feels that she is overall improving.  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  Repeat colonoscopy in 10 years - 05/2024: RUQ US : Cholelithiasis w/o cholecystitis or duct dilation - 05/2024: Normal CBC, CMP, lipase - 06/2024: Normal ESR, CRP, fecal calprotectin - 06/29/2024: Laparoscopic cholecystectomy with pathology showing chronic cholecystitis and cholelithiasis   Also following with a Functional Medicine practice and taking probiotics.   Past medical history, past surgical history, social history, family history, medications, and allergies reviewed in the chart and with  patient.    Past Medical History:  Diagnosis Date   Adnexal mass 03/02/2018   Right 5.1cm   Allergy    Anemia    low iron levels per patient   Anxiety    Gallstones    GERD (gastroesophageal reflux disease)    occ    Hay fever    Hernia, umbilical    History of kidney stones    Hypothyroidism    Left breast mass    UTI (urinary tract infection)      Past Surgical History:  Procedure Laterality Date   CHOLECYSTECTOMY N/A 06/29/2024   Procedure: LAPAROSCOPIC CHOLECYSTECTOMY;  Surgeon: Rubin Calamity, MD;  Location: [facility] OR;  Service: General;  Laterality: N/A;   CYSTOSCOPY WITH STENT PLACEMENT Bilateral 04/29/2018   Procedure: CYSTOSCOPY WITH  BILATERAL RETROGRADE PYELOGRAM RIGHT STENT PLACEMENT;  Surgeon: Cam Morene ORN, MD;  Location: [facility];  Service: Urology;  Laterality: Bilateral;   EXTRACORPOREAL SHOCK WAVE LITHOTRIPSY Right 07/25/2022   Procedure: EXTRACORPOREAL SHOCK WAVE LITHOTRIPSY (ESWL);  Surgeon: Elisabeth Valli BIRCH, MD;  Location: [facility];  Service: Urology;  Laterality: Right;   IR RADIOLOGIST EVAL & MGMT  05/27/2018   LAPAROSCOPIC REMOVAL ABDOMINAL MASS N/A 04/29/2018   Procedure: LAPAROSCOPIC RIGHT HEMI-COLECTOMY ERAS PATHWAY;  Surgeon: Aron Shoulders, MD;  Location: [facility];  Service: General;  Laterality: N/A;   Family History  Problem Relation Age of Onset   Diabetes Mother    Aneurysm Father    Diabetes Brother    Colon polyps Paternal Grandmother    Breast cancer Paternal Grandmother    Breast cancer Other    Liver disease Neg Hx    Esophageal cancer Neg Hx    Colon cancer Neg Hx    Rectal cancer Neg Hx  Stomach cancer Neg Hx    Social History[1] Current Outpatient Medications  Medication Sig Dispense Refill   cyanocobalamin  (VITAMIN B12) 500 MCG tablet Take 500 mcg by mouth daily.     Digestive Enzymes (DIGESTIVE ENZYME PO) Take 1 capsule by mouth 3 (three) times daily before meals.     hydrOXYzine  (VISTARIL ) 25 MG capsule  Take 1 capsule (25 mg total) by mouth every 8 (eight) hours as needed. 30 capsule 0   magnesium gluconate (MAGONATE) 500 MG tablet Take 500 mg by mouth at bedtime.     NP THYROID  15 MG tablet Take 15 mg by mouth every morning.     Omega-3 Fatty Acids (FISH OIL ODOR-LESS PO) Take 1,500 mg by mouth daily.     ondansetron  (ZOFRAN ) 4 MG tablet Take 1 tablet (4 mg total) by mouth every 6 (six) hours as needed for nausea or vomiting. 30 tablet 0   OVER THE COUNTER MEDICATION Take 1 capsule by mouth daily. Gallbladder Cleanse     predniSONE  (STERAPRED UNI-PAK 21 TAB) 10 MG (21) TBPK tablet Use as directed 21 tablet 0   Probiotic Product (PROBIOTIC DAILY PO) Take 1 capsule by mouth daily.     traMADol  (ULTRAM ) 50 MG tablet Take 1 tablet (50 mg total) by mouth every 6 (six) hours as needed. 15 tablet 0   traMADol  (ULTRAM ) 50 MG tablet Take 1 tablet (50 mg total) by mouth every 6 (six) hours as needed. 20 tablet 0   VITAMIN D -VITAMIN K PO Take 1 tablet by mouth daily.     No current facility-administered medications for this visit.   Allergies[2]   Review of Systems: All systems reviewed and negative except where noted in HPI.     Physical Exam:    Complete physical exam not completed due to the nature of this telehealth communication.   Gen: Awake, alert, and oriented, and well communicative. HEENT: EOMI, non-icteric sclera, NCAT, MMM Neck: Normal movement of head and neck Pulm: No labored breathing, speaking in full sentences without conversational dyspnea Derm: No apparent lesions or bruising in visible field MS: Moves all visible extremities without noticeable abnormality Psych: Pleasant, cooperative, normal speech, thought processing seemingly intact   ASSESSMENT AND PLAN;   1) Abdominal discomfort 2) Change in bowel habits 3) Colon Ulcer In-depth conversation today regarding her GI symptoms.  She did have a colonoscopy 04/2023 for CRC screening which was notable for a single small,  superficial ulcer in the sigmoid colon with path showing active, chronic colitis.  Was otherwise largely asymptomatic at that time.  Only went for labs last month which showed normal ESR, CRP, fecal calprotectin.  Very low suspicion that this represents something like Crohn's disease, and more likely was an incidental finding (possibly resolving infection?).  Since then, does have some abdominal discomfort and intermittent loose stools, but not frank diarrhea.  We discussed potential etiologies, and mutually agreed to give a little time to see if symptoms improve after recent cholecystectomy.  - Patient will let me know how she is doing over the next couple of months as she recovers from cholecystectomy - If ongoing GI symptoms, plan for CT abdomen/pelvis (particular given history as below), and repeat labs to include CBC, ESR, CRP, TSH  4) History of myofibroblastic tumor - As above, if ongoing GI symptoms plan for cross-sectional imaging - Will modify repeat colonoscopy to 5 years (04/2028) given her personal history and elevated patient concerns about waiting 10 years  RTC in 3-6 months or  sooner prn    Erica LULLA Flatter, DO, FACG  07/29/2024, 11:19 AM   Erica Mary-Margaret, FNP     [1]  Social History Tobacco Use   Smoking status: Never   Smokeless tobacco: Never  Vaping Use   Vaping status: Never Used  Substance Use Topics   Alcohol use: No    Alcohol/week: 0.0 standard drinks of alcohol   Drug use: No  [2]  Allergies Allergen Reactions   Watermelon [Citrullus Vulgaris] Itching    Throat itches.   Amoxicillin Rash    Has patient had a PCN reaction causing immediate rash, facial/tongue/throat swelling, SOB or lightheadedness with hypotension: No Has patient had a PCN reaction causing severe rash involving mucus membranes or skin necrosis: No Has patient had a PCN reaction that required hospitalization: No Has patient had a PCN reaction occurring within the last 10 years:  Yes--very mild rash If all of the above answers are NO, then may proceed with Cephalosporin use.

## 2024-07-29 NOTE — Patient Instructions (Addendum)
 _______________________________________________________  If your blood pressure at your visit was 140/90 or greater, please contact your primary care physician to follow up on this.  _______________________________________________________  If you are age 48 or older, your body mass index should be between 23-30. Your Body mass index is 24.79 kg/m. If this is out of the aforementioned range listed, please consider follow up with your Primary Care Provider.  If you are age 78 or younger, your body mass index should be between 19-25. Your Body mass index is 24.79 kg/m. If this is out of the aformentioned range listed, please consider follow up with your Primary Care Provider.   ________________________________________________________  The [facility] providers would like to encourage you to use MYCHART to communicate with providers for non-urgent requests or questions.  Due to long hold times on the telephone, sending your provider a message by [facility] may be a faster and more efficient way to get a response.  Please allow 48 business hours for a response.  Please remember that this is for non-urgent requests.  _______________________________________________________  We will see you back in our office in 3 to 6 months for follow up.   [facility] is using a team-based approach to care.  Your team is made up of your doctor and two to three APPS. Our APPS (Nurse Practitioners and Physician Assistants) work with your physician to ensure care continuity for you. They are fully qualified to address your health concerns and develop a treatment plan. They communicate directly with your gastroenterologist to care for you. Seeing the Advanced Practice Practitioners on your physician's team can help you by facilitating care more promptly, often allowing for earlier appointments, access to diagnostic testing, procedures, and other specialty referrals.   It was a pleasure to see you today!  Vito  Cirigliano, D.O.

## 2024-08-24 ENCOUNTER — Telehealth: Admitting: Nurse Practitioner

## 2024-08-24 ENCOUNTER — Encounter: Payer: Self-pay | Admitting: Nurse Practitioner

## 2024-08-24 DIAGNOSIS — J00 Acute nasopharyngitis [common cold]: Secondary | ICD-10-CM

## 2024-08-24 MED ORDER — CHLORPHEN-PE-ACETAMINOPHEN 4-10-325 MG PO TABS: 1.0000 | ORAL_TABLET | Freq: Four times a day (QID) | ORAL | 0 refills | Status: AC | PRN

## 2024-08-24 NOTE — Progress Notes (Signed)
 "   Virtual Visit Consent   Erica Ross, you are scheduled for a virtual visit with Mary-Margaret Gladis, FNP, a [facility] provider, today.     Just as with appointments in the office, your consent must be obtained to participate.  Your consent will be active for this visit and any virtual visit you may have with one of our providers in the next 365 days.     If you have a MyChart account, a copy of this consent can be sent to you electronically.  All virtual visits are billed to your insurance company just like a traditional visit in the office.    As this is a virtual visit, video technology does not allow for your provider to perform a traditional examination.  This may limit your provider's ability to fully assess your condition.  If your provider identifies any concerns that need to be evaluated in person or the need to arrange testing (such as labs, EKG, etc.), we will make arrangements to do so.     Although advances in technology are sophisticated, we cannot ensure that it will always work on either your end or our end.  If the connection with a video visit is poor, the visit may have to be switched to a telephone visit.  With either a video or telephone visit, we are not always able to ensure that we have a secure connection.     I need to obtain your verbal consent now.   Are you willing to proceed with your visit today? YES   Erica Ross has provided verbal consent on 08/24/2024 for a virtual visit (video or telephone).   Mary-Margaret Gladis, FNP   Date: 08/24/2024 1:58 PM   Virtual Visit via Video Note   I, Mary-Margaret Gladis, connected with Erica Ross ([id number], 11-13-1975) on 08/24/2024 at  2:45 PM EST by a video-enabled telemedicine application and verified that I am speaking with the correct person using two identifiers.  Location: Patient: Virtual Visit Location Patient: Home Provider: Virtual Visit Location Provider: Mobile   I discussed the limitations of  evaluation and management by telemedicine and the availability of in person appointments. The patient expressed understanding and agreed to proceed.    History of Present Illness: Erica Ross is a 49 y.o. who identifies as a female who was assigned female at birth, and is being seen today for sinusitis.  HPI: Sinusitis This is a new problem. The current episode started in the past 7 days. The problem has been waxing and waning since onset. There has been no fever. Her pain is at a severity of 3/10. Associated symptoms include congestion and coughing. Pertinent negatives include no sinus pressure or sore throat. Past treatments include oral decongestants. The treatment provided mild relief.    Review of Systems  HENT:  Positive for congestion. Negative for sinus pressure and sore throat.   Respiratory:  Positive for cough.     Problems:  Patient Active Problem List   Diagnosis Date Noted   Frequency of micturition 04/18/2018    Allergies: Allergies[1] Medications: Current Medications[2]  Observations/Objective: Patient is well-developed, well-nourished in no acute distress.  Resting comfortably  at home.  Head is normocephalic, atraumatic.  No labored breathing.  Speech is clear and coherent with logical content.  Patient is alert and oriented at baseline.  Wet cough No facial pressure  Assessment and Plan:  Erica Ross in today with chief complaint of URI  1. Acute nasopharyngitis (  Primary) 1. Take meds as prescribed 2. Use a cool mist humidifier especially during the winter months and when heat has been humid. 3. Use saline nose sprays frequently 4. Saline irrigations of the nose can be very helpful if done frequently.  * 4X daily for 1 week*  * Use of a nettie pot can be helpful with this. Follow directions with this* 5. Drink plenty of fluids 6. Keep thermostat turn down low 7.For any cough or congestion- norel AD 8. For fever or aces or pains- take tylenol  or  ibuprofen appropriate for age and weight.  * for fevers greater than 101 orally you may alternate ibuprofen and tylenol  every  3 hours.    - Chlorphen-PE-Acetaminophen  4-10-325 MG TABS; Take 1 tablet by mouth every 6 (six) hours as needed.  Dispense: 20 tablet; Refill: 0   Follow Up Instructions: I discussed the assessment and treatment plan with the patient. The patient was provided an opportunity to ask questions and all were answered. The patient agreed with the plan and demonstrated an understanding of the instructions.  A copy of instructions were sent to the patient via MyChart.  The patient was advised to call back or seek an in-person evaluation if the symptoms worsen or if the condition fails to improve as anticipated.  Time:  I spent 8 minutes with the patient via telehealth technology discussing the above problems/concerns.    Mary-Margaret Gladis, FNP    [1]  Allergies Allergen Reactions   Watermelon [Citrullus Vulgaris] Itching    Throat itches.   Amoxicillin Rash    Has patient had a PCN reaction causing immediate rash, facial/tongue/throat swelling, SOB or lightheadedness with hypotension: No Has patient had a PCN reaction causing severe rash involving mucus membranes or skin necrosis: No Has patient had a PCN reaction that required hospitalization: No Has patient had a PCN reaction occurring within the last 10 years: Yes--very mild rash If all of the above answers are NO, then may proceed with Cephalosporin use.   [2]  Current Outpatient Medications:    cyanocobalamin  (VITAMIN B12) 500 MCG tablet, Take 500 mcg by mouth daily., Disp: , Rfl:    Digestive Enzymes (DIGESTIVE ENZYME PO), Take 1 capsule by mouth 3 (three) times daily before meals., Disp: , Rfl:    hydrOXYzine  (VISTARIL ) 25 MG capsule, Take 1 capsule (25 mg total) by mouth every 8 (eight) hours as needed., Disp: 30 capsule, Rfl: 0   magnesium gluconate (MAGONATE) 500 MG tablet, Take 500 mg by mouth at  bedtime., Disp: , Rfl:    NP THYROID  15 MG tablet, Take 15 mg by mouth every morning., Disp: , Rfl:    Omega-3 Fatty Acids (FISH OIL ODOR-LESS PO), Take 1,500 mg by mouth daily., Disp: , Rfl:    ondansetron  (ZOFRAN ) 4 MG tablet, Take 1 tablet (4 mg total) by mouth every 6 (six) hours as needed for nausea or vomiting., Disp: 30 tablet, Rfl: 0   OVER THE COUNTER MEDICATION, Take 1 capsule by mouth daily. Gallbladder Cleanse, Disp: , Rfl:    predniSONE  (STERAPRED UNI-PAK 21 TAB) 10 MG (21) TBPK tablet, Use as directed, Disp: 21 tablet, Rfl: 0   Probiotic Product (PROBIOTIC DAILY PO), Take 1 capsule by mouth daily., Disp: , Rfl:    traMADol  (ULTRAM ) 50 MG tablet, Take 1 tablet (50 mg total) by mouth every 6 (six) hours as needed., Disp: 15 tablet, Rfl: 0   traMADol  (ULTRAM ) 50 MG tablet, Take 1 tablet (50 mg total) by mouth  every 6 (six) hours as needed., Disp: 20 tablet, Rfl: 0   VITAMIN D -VITAMIN K PO, Take 1 tablet by mouth daily., Disp: , Rfl:   "

## 2024-08-24 NOTE — Patient Instructions (Addendum)
1. Take meds as prescribed ?2. Use a cool mist humidifier especially during the winter months and when heat has been humid. ?3. Use saline nose sprays frequently ?4. Saline irrigations of the nose can be very helpful if done frequently. ? * 4X daily for 1 week* ? * Use of a nettie pot can be helpful with this. Follow directions with this* ?5. Drink plenty of fluids ?6. Keep thermostat turn down low ?7.For any cough or congestion- norel AD ?8. For fever or aces or pains- take tylenol or ibuprofen appropriate for age and weight. ? * for fevers greater than 101 orally you may alternate ibuprofen and tylenol every  3 hours. ?  ? ?

## 2024-08-27 ENCOUNTER — Ambulatory Visit: Payer: Self-pay

## 2024-08-27 MED ORDER — AZITHROMYCIN 250 MG PO TABS: ORAL_TABLET | ORAL | 0 refills | Status: AC

## 2024-08-27 NOTE — Telephone Encounter (Signed)
 Zpak sent to pharmacy- need to use flonase nasal spray daily along with it  Meds ordered this encounter  Medications   azithromycin  (ZITHROMAX  Z-PAK) 250 MG tablet    Sig: As directed    Dispense:  6 tablet    Refill:  0    Supervising Provider:   MARYANNE CHEW A [[id number]]   Mary-Margaret Gladis, FNP

## 2024-08-27 NOTE — Telephone Encounter (Signed)
 FYI Only or Action Required?: FYI only for provider: UC.  Patient was last seen in primary care on 08/24/2024 by Erica Mustard, FNP.  Called Nurse Triage reporting Nasal Congestion.  Symptoms began several weeks ago.  Interventions attempted: OTC medications: Afrin, Nyquil, Dayquil, Benadryl .  Symptoms are: gradually worsening.  Triage Disposition: See PCP When Office is Open (Within 3 Days)  Patient/caregiver understands and will follow disposition?: Yes   Message from [name] ORN sent at 08/27/2024  2:55 PM EST  Summary: getting worse day 5 stuffy nose and really fatique   Reason for Triage: day 5 stuffy nose and really fatique  was seen earlier in the week not getting better      Reason for Disposition  [1] Sinus congestion (pressure, fullness) AND [2] present > 10 days  Answer Assessment - Initial Assessment Questions 1. LOCATION: Where does it hurt?      Nose  2. ONSET: When did the sinus pain start?  (e.g., hours, days)      X 1 week  3. SEVERITY: How bad is the pain?   (Scale 0-10; or none, mild, moderate or severe)     Difficulty sleeping due to congestion  4. RECURRENT SYMPTOM: Have you ever had sinus problems before? If Yes, ask: When was the last time? and What happened that time?      Denies  5. NASAL CONGESTION: Is the nose blocked? If Yes, ask: Can you open it or must you breathe through your mouth?      Yes  6. NASAL DISCHARGE: Do you have discharge from your nose? If so ask, What color?      Yellow/clear  7. FEVER: Do you have a fever? If Yes, ask: What is it, how was it measured, and when did it start?       Denies  8. OTHER SYMPTOMS: Pt denies sore throat, cough, earache, difficulty breathing      Generalized itching- resolved  Pt reports nasal congestion Pt is taking OTC Afinin, Nyquil, Dayquil, Allergy relief, Benadryl  Pt offered an in clinic visit on Monday, 01.12.26, however, pt declined.  Pt advised to go to UC   Pt advised to try a humidifier, Vicks, continue nasal spray if it helps. Pt agrees with plan of care, will call back for any worsening symptoms  Protocols used: Sinus Pain or Congestion-A-AH

## 2024-08-30 ENCOUNTER — Encounter: Payer: Self-pay | Admitting: Nurse Practitioner

## 2024-08-30 ENCOUNTER — Telehealth: Payer: Self-pay | Admitting: Nurse Practitioner

## 2024-08-30 ENCOUNTER — Telehealth: Admitting: Nurse Practitioner

## 2024-08-30 DIAGNOSIS — R0981 Nasal congestion: Secondary | ICD-10-CM

## 2024-08-30 MED ORDER — PREDNISONE 20 MG PO TABS: 40.0000 mg | ORAL_TABLET | Freq: Every day | ORAL | 0 refills | Status: AC

## 2024-08-30 NOTE — Progress Notes (Signed)
 "   Virtual Visit Consent   Erica Ross, you are scheduled for a virtual visit with Mary-Margaret Gladis, FNP, a [facility] provider, today.     Just as with appointments in the office, your consent must be obtained to participate.  Your consent will be active for this visit and any virtual visit you may have with one of our providers in the next 365 days.     If you have a MyChart account, a copy of this consent can be sent to you electronically.  All virtual visits are billed to your insurance company just like a traditional visit in the office.    As this is a virtual visit, video technology does not allow for your provider to perform a traditional examination.  This may limit your provider's ability to fully assess your condition.  If your provider identifies any concerns that need to be evaluated in person or the need to arrange testing (such as labs, EKG, etc.), we will make arrangements to do so.     Although advances in technology are sophisticated, we cannot ensure that it will always work on either your end or our end.  If the connection with a video visit is poor, the visit may have to be switched to a telephone visit.  With either a video or telephone visit, we are not always able to ensure that we have a secure connection.     I need to obtain your verbal consent now.   Are you willing to proceed with your visit today? YES   Erica Ross has provided verbal consent on 08/30/2024 for a virtual visit (video or telephone).   Mary-Margaret Gladis, FNP   Date: 08/30/2024 11:32 AM   Virtual Visit via Video Note   I, Mary-Margaret Gladis, connected with Erica Ross ([id number], [birth year]) on 08/30/2024 at 12:30 PM EST by a video-enabled telemedicine application and verified that I am speaking with the correct person using two identifiers.  Location: Patient: Virtual Visit Location Patient: Other: work Provider: Pharmacist, Community: Mobile   I discussed the  limitations of evaluation and management by telemedicine and the availability of in person appointments. The patient expressed understanding and agreed to proceed.    History of Present Illness: Erica Ross is a 49 y.o. who identifies as a female who was assigned female at birth, and is being seen today for congestion.  HPI: patient had video visit last week and was dx with duana- she called back on Friday stating no better and wanted antibiotic. She was sent in antibiotic. Today she says she is still congested. 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 Review of Systems  Constitutional:  Negative for diaphoresis and weight loss.  HENT:  Positive for congestion.   Eyes:  Negative for blurred vision, double vision and pain.  Respiratory:  Negative for shortness of breath.   Cardiovascular:  Negative for chest pain, palpitations, orthopnea and leg swelling.  Gastrointestinal:  Negative for abdominal pain.  Skin:  Negative for rash.  Neurological:  Negative for dizziness, sensory change, loss of consciousness, weakness and headaches.  Endo/Heme/Allergies:  Negative for polydipsia. Does not bruise/bleed easily.  Psychiatric/Behavioral:  Negative for memory loss. The patient does not have insomnia.   All other systems reviewed and are negative.   Problems:  Patient Active Problem List   Diagnosis Date Noted   Frequency of micturition 04/18/2018    Allergies:  Allergies[1] Medications: Current Medications[2]  Observations/Objective: Patient is well-developed, well-nourished in no acute distress.  Resting comfortably  at home.  Head is normocephalic, atraumatic.  No labored breathing.  Speech is clear and coherent with logical content.  Patient is alert and oriented at baseline.  Face flushed Wet cough  Assessment and Plan:  Erica Ross in today with chief complaint of sinus congestion  1. Sinus congestion (Primary) 1. Take meds as  prescribed 2. Use a cool mist humidifier especially during the winter months and when heat has been humid. 3. Use saline nose sprays frequently 4. Saline irrigations of the nose can be very helpful if done frequently.  * 4X daily for 1 week*  * Use of a nettie pot can be helpful with this. Follow directions with this* 5. Drink plenty of fluids 6. Keep thermostat turn down low 7.For any cough or congestion- mucinex 8. For fever or aces or pains- take tylenol  or ibuprofen appropriate for age and weight.  * for fevers greater than 101 orally you may alternate ibuprofen and tylenol  every  3 hours.   Meds ordered this encounter  Medications   predniSONE  (DELTASONE ) 20 MG tablet    Sig: Take 2 tablets (40 mg total) by mouth daily with breakfast for 5 days. 2 po daily for 5 days    Dispense:  10 tablet    Refill:  0    Supervising Provider:   MARYANNE CHEW A [[id number]]     Follow Up Instructions: I discussed the assessment and treatment plan with the patient. The patient was provided an opportunity to ask questions and all were answered. The patient agreed with the plan and demonstrated an understanding of the instructions.  A copy of instructions were sent to the patient via MyChart.  The patient was advised to call back or seek an in-person evaluation if the symptoms worsen or if the condition fails to improve as anticipated.  Time:  I spent 9 minutes with the patient via telehealth technology discussing the above problems/concerns.    Mary-Margaret Gladis, FNP     [1]  Allergies Allergen Reactions   Watermelon [Citrullus Vulgaris] Itching    Throat itches.   Amoxicillin Rash    Has patient had a PCN reaction causing immediate rash, facial/tongue/throat swelling, SOB or lightheadedness with hypotension: No Has patient had a PCN reaction causing severe rash involving mucus membranes or skin necrosis: No Has patient had a PCN reaction that required hospitalization: No Has  patient had a PCN reaction occurring within the last 10 years: Yes--very mild rash If all of the above answers are NO, then may proceed with Cephalosporin use.   [2]  Current Outpatient Medications:    azithromycin  (ZITHROMAX  Z-PAK) 250 MG tablet, As directed, Disp: 6 tablet, Rfl: 0   Chlorphen-PE-Acetaminophen  4-10-325 MG TABS, Take 1 tablet by mouth every 6 (six) hours as needed., Disp: 20 tablet, Rfl: 0   cyanocobalamin  (VITAMIN B12) 500 MCG tablet, Take 500 mcg by mouth daily., Disp: , Rfl:    Digestive Enzymes (DIGESTIVE ENZYME PO), Take 1 capsule by mouth 3 (three) times daily before meals., Disp: , Rfl:    hydrOXYzine  (VISTARIL ) 25 MG capsule, Take 1 capsule (25 mg total) by mouth every 8 (eight) hours as needed., Disp: 30 capsule, Rfl: 0   magnesium gluconate (MAGONATE) 500 MG tablet, Take 500 mg by mouth at bedtime., Disp: , Rfl:    NP THYROID  15 MG tablet, Take 15 mg by mouth every morning., Disp: ,  Rfl:    Omega-3 Fatty Acids (FISH OIL ODOR-LESS PO), Take 1,500 mg by mouth daily., Disp: , Rfl:    ondansetron  (ZOFRAN ) 4 MG tablet, Take 1 tablet (4 mg total) by mouth every 6 (six) hours as needed for nausea or vomiting., Disp: 30 tablet, Rfl: 0   OVER THE COUNTER MEDICATION, Take 1 capsule by mouth daily. Gallbladder Cleanse, Disp: , Rfl:    predniSONE  (STERAPRED UNI-PAK 21 TAB) 10 MG (21) TBPK tablet, Use as directed, Disp: 21 tablet, Rfl: 0   Probiotic Product (PROBIOTIC DAILY PO), Take 1 capsule by mouth daily., Disp: , Rfl:    traMADol  (ULTRAM ) 50 MG tablet, Take 1 tablet (50 mg total) by mouth every 6 (six) hours as needed., Disp: 15 tablet, Rfl: 0   traMADol  (ULTRAM ) 50 MG tablet, Take 1 tablet (50 mg total) by mouth every 6 (six) hours as needed., Disp: 20 tablet, Rfl: 0   VITAMIN D -VITAMIN K PO, Take 1 tablet by mouth daily., Disp: , Rfl:   "

## 2024-08-30 NOTE — Telephone Encounter (Signed)
 I tried to call pt to collect copay & she hung up on me.

## 2024-08-30 NOTE — Patient Instructions (Signed)
